# Patient Record
Sex: Female | Born: 1945 | Race: White | Hispanic: No | Smoking: Current every day smoker
Health system: Southern US, Community
[De-identification: ages and names within clinical notes are randomized; demographics above are authoritative.]

## PROBLEM LIST (undated history)

## (undated) DIAGNOSIS — J449 Chronic obstructive pulmonary disease, unspecified: Secondary | ICD-10-CM

## (undated) DIAGNOSIS — F17200 Nicotine dependence, unspecified, uncomplicated: Secondary | ICD-10-CM

## (undated) DIAGNOSIS — J310 Chronic rhinitis: Secondary | ICD-10-CM

## (undated) DIAGNOSIS — E039 Hypothyroidism, unspecified: Secondary | ICD-10-CM

## (undated) DIAGNOSIS — J209 Acute bronchitis, unspecified: Secondary | ICD-10-CM

## (undated) DIAGNOSIS — D649 Anemia, unspecified: Secondary | ICD-10-CM

## (undated) DIAGNOSIS — M1990 Unspecified osteoarthritis, unspecified site: Secondary | ICD-10-CM

## (undated) DIAGNOSIS — E079 Disorder of thyroid, unspecified: Secondary | ICD-10-CM

## (undated) DIAGNOSIS — E0590 Thyrotoxicosis, unspecified without thyrotoxic crisis or storm: Secondary | ICD-10-CM

## (undated) DIAGNOSIS — R51 Headache: Secondary | ICD-10-CM

## (undated) DIAGNOSIS — K219 Gastro-esophageal reflux disease without esophagitis: Secondary | ICD-10-CM

## (undated) DIAGNOSIS — R519 Headache, unspecified: Secondary | ICD-10-CM

## (undated) HISTORY — DX: Nicotine dependence, unspecified, uncomplicated: F17.200

## (undated) HISTORY — PX: GASTRIC OUTLET OBSTRUCTION RELEASE: SHX5247

## (undated) HISTORY — DX: Acute bronchitis, unspecified: J20.9

## (undated) HISTORY — DX: Chronic rhinitis: J31.0

## (undated) HISTORY — PX: TOTAL ABDOMINAL HYSTERECTOMY: SHX209

---

## 1999-10-03 ENCOUNTER — Encounter: Payer: Self-pay | Admitting: Obstetrics and Gynecology

## 1999-10-03 ENCOUNTER — Ambulatory Visit (HOSPITAL_COMMUNITY): Admission: RE | Admit: 1999-10-03 | Discharge: 1999-10-03 | Payer: Self-pay | Admitting: Obstetrics and Gynecology

## 2000-05-03 ENCOUNTER — Emergency Department (HOSPITAL_COMMUNITY): Admission: EM | Admit: 2000-05-03 | Discharge: 2000-05-03 | Payer: Self-pay

## 2001-03-22 ENCOUNTER — Encounter: Admission: RE | Admit: 2001-03-22 | Discharge: 2001-03-22 | Payer: Self-pay | Admitting: Surgery

## 2001-03-22 ENCOUNTER — Encounter: Payer: Self-pay | Admitting: Surgery

## 2003-12-21 ENCOUNTER — Encounter: Admission: RE | Admit: 2003-12-21 | Discharge: 2003-12-21 | Payer: Self-pay | Admitting: Internal Medicine

## 2004-01-14 ENCOUNTER — Ambulatory Visit (HOSPITAL_COMMUNITY): Admission: RE | Admit: 2004-01-14 | Discharge: 2004-01-14 | Payer: Self-pay | Admitting: Gastroenterology

## 2004-01-21 ENCOUNTER — Inpatient Hospital Stay (HOSPITAL_COMMUNITY): Admission: RE | Admit: 2004-01-21 | Discharge: 2004-01-26 | Payer: Self-pay | Admitting: *Deleted

## 2004-01-21 ENCOUNTER — Encounter (INDEPENDENT_AMBULATORY_CARE_PROVIDER_SITE_OTHER): Payer: Self-pay | Admitting: *Deleted

## 2004-02-07 ENCOUNTER — Encounter: Admission: RE | Admit: 2004-02-07 | Discharge: 2004-02-07 | Payer: Self-pay | Admitting: Surgery

## 2004-03-28 ENCOUNTER — Ambulatory Visit: Payer: Self-pay | Admitting: Internal Medicine

## 2004-04-14 ENCOUNTER — Ambulatory Visit: Payer: Self-pay | Admitting: Internal Medicine

## 2004-07-09 ENCOUNTER — Other Ambulatory Visit: Admission: RE | Admit: 2004-07-09 | Discharge: 2004-07-09 | Payer: Self-pay | Admitting: Internal Medicine

## 2004-07-10 ENCOUNTER — Encounter: Admission: RE | Admit: 2004-07-10 | Discharge: 2004-07-10 | Payer: Self-pay | Admitting: Internal Medicine

## 2004-07-23 ENCOUNTER — Encounter: Admission: RE | Admit: 2004-07-23 | Discharge: 2004-07-23 | Payer: Self-pay | Admitting: Internal Medicine

## 2004-09-19 ENCOUNTER — Ambulatory Visit: Payer: Self-pay | Admitting: Internal Medicine

## 2004-12-01 ENCOUNTER — Ambulatory Visit: Payer: Self-pay | Admitting: Internal Medicine

## 2005-03-10 ENCOUNTER — Ambulatory Visit: Payer: Self-pay | Admitting: Internal Medicine

## 2005-03-26 ENCOUNTER — Encounter: Admission: RE | Admit: 2005-03-26 | Discharge: 2005-03-26 | Payer: Self-pay | Admitting: Internal Medicine

## 2005-04-03 ENCOUNTER — Ambulatory Visit: Payer: Self-pay | Admitting: Internal Medicine

## 2005-04-09 ENCOUNTER — Ambulatory Visit: Payer: Self-pay | Admitting: Internal Medicine

## 2005-09-18 ENCOUNTER — Encounter: Admission: RE | Admit: 2005-09-18 | Discharge: 2005-09-18 | Payer: Self-pay | Admitting: Gastroenterology

## 2005-11-02 ENCOUNTER — Encounter: Admission: RE | Admit: 2005-11-02 | Discharge: 2005-11-02 | Payer: Self-pay | Admitting: Internal Medicine

## 2005-12-25 ENCOUNTER — Ambulatory Visit: Payer: Self-pay | Admitting: Internal Medicine

## 2006-01-22 ENCOUNTER — Ambulatory Visit: Payer: Self-pay | Admitting: Pulmonary Disease

## 2006-02-05 ENCOUNTER — Ambulatory Visit: Payer: Self-pay | Admitting: Internal Medicine

## 2006-11-03 ENCOUNTER — Ambulatory Visit: Payer: Self-pay | Admitting: Internal Medicine

## 2006-11-19 DIAGNOSIS — J209 Acute bronchitis, unspecified: Secondary | ICD-10-CM | POA: Insufficient documentation

## 2006-11-19 DIAGNOSIS — F17200 Nicotine dependence, unspecified, uncomplicated: Secondary | ICD-10-CM | POA: Insufficient documentation

## 2006-11-19 DIAGNOSIS — J310 Chronic rhinitis: Secondary | ICD-10-CM | POA: Insufficient documentation

## 2007-02-17 ENCOUNTER — Ambulatory Visit: Payer: Self-pay | Admitting: Pulmonary Disease

## 2007-02-17 DIAGNOSIS — J019 Acute sinusitis, unspecified: Secondary | ICD-10-CM | POA: Insufficient documentation

## 2007-03-08 ENCOUNTER — Encounter: Payer: Self-pay | Admitting: Internal Medicine

## 2008-03-01 ENCOUNTER — Encounter: Admission: RE | Admit: 2008-03-01 | Discharge: 2008-03-01 | Payer: Self-pay | Admitting: Gastroenterology

## 2008-05-18 ENCOUNTER — Ambulatory Visit: Payer: Self-pay | Admitting: Internal Medicine

## 2008-05-29 ENCOUNTER — Ambulatory Visit: Payer: Self-pay | Admitting: Internal Medicine

## 2008-06-29 ENCOUNTER — Ambulatory Visit: Payer: Self-pay | Admitting: Internal Medicine

## 2008-08-06 ENCOUNTER — Encounter: Admission: RE | Admit: 2008-08-06 | Discharge: 2008-08-06 | Payer: Self-pay | Admitting: Internal Medicine

## 2008-11-07 ENCOUNTER — Ambulatory Visit: Payer: Self-pay | Admitting: Internal Medicine

## 2008-12-03 ENCOUNTER — Ambulatory Visit: Payer: Self-pay | Admitting: Internal Medicine

## 2009-02-07 ENCOUNTER — Encounter: Admission: RE | Admit: 2009-02-07 | Discharge: 2009-02-07 | Payer: Self-pay | Admitting: Internal Medicine

## 2009-03-15 ENCOUNTER — Ambulatory Visit: Payer: Self-pay | Admitting: Internal Medicine

## 2009-05-13 ENCOUNTER — Ambulatory Visit: Payer: Self-pay | Admitting: Internal Medicine

## 2009-09-17 ENCOUNTER — Ambulatory Visit: Payer: Self-pay | Admitting: Internal Medicine

## 2009-11-26 ENCOUNTER — Encounter: Admission: RE | Admit: 2009-11-26 | Discharge: 2009-11-26 | Payer: Self-pay | Admitting: Internal Medicine

## 2009-12-03 ENCOUNTER — Telehealth (INDEPENDENT_AMBULATORY_CARE_PROVIDER_SITE_OTHER): Payer: Self-pay | Admitting: *Deleted

## 2010-01-28 ENCOUNTER — Encounter
Admission: RE | Admit: 2010-01-28 | Discharge: 2010-01-28 | Payer: Self-pay | Source: Home / Self Care | Attending: Internal Medicine | Admitting: Internal Medicine

## 2010-02-09 DIAGNOSIS — E0590 Thyrotoxicosis, unspecified without thyrotoxic crisis or storm: Secondary | ICD-10-CM

## 2010-02-09 HISTORY — PX: THYROIDECTOMY: SHX17

## 2010-02-09 HISTORY — DX: Thyrotoxicosis, unspecified without thyrotoxic crisis or storm: E05.90

## 2010-02-25 ENCOUNTER — Encounter
Admission: RE | Admit: 2010-02-25 | Discharge: 2010-02-25 | Payer: Self-pay | Source: Home / Self Care | Attending: General Surgery | Admitting: General Surgery

## 2010-02-25 ENCOUNTER — Other Ambulatory Visit
Admission: RE | Admit: 2010-02-25 | Discharge: 2010-02-25 | Payer: Self-pay | Source: Home / Self Care | Admitting: Interventional Radiology

## 2010-02-25 ENCOUNTER — Other Ambulatory Visit: Payer: Self-pay | Admitting: Interventional Radiology

## 2010-03-13 NOTE — Assessment & Plan Note (Signed)
Summary: lots of head congestion/up all night/apc   Primary Provider/Referring Provider:  Burney Gauze  CC:  Acute visit.  Pt c/o prod cough x 5 days- sputum is clear. Cough worse at night when lies down.  She states that cough caused her to vomit last night.  Also c/o runny nose and occ sore throat.Marland Kitchen  History of Present Illness: 11/07/08- Allergic rhinitis, recurrent bronchitis Got through summer without significant problems. Occasional runny nose, but no significant sneeze, draiinage, congestion and no cough or wheeze.Not needing Flonase. Walks regularly. Still smoking. Gets annual CXR through Dr Donette Larry for work.  , December 03, 2008--Presents for an acute office visit. Complains of sore throat, cough, congestion, drainage, malaise. Doing well with no flare since 4/10. Cough is getting worse , aggravating her now. Sore throat is worse in morning, getting better. Denies chest pain, orthopnea, hemoptysis, fever, n/v/d, edema, headache,recent travel or antibitoics.   March 15, 2009- Allergic rhinits, recurrent bronchitis 4-5 days acutely ill with headache, sore throat, ears full, cough is productive white, retching from postnasal drip.  May 13, 2009- Allergic rhinitis, recurent bronchitis Pollen is heavy now and she feels worse ofter being outside to walk her dog. Taking Allegra and Sudafed For 2 weeks ears popping, maxillary pressure, runny nose, postnasal drip that makes her queasy. She asks for repeat of what helped last time. Tramadol helped cough.  September 17, 2009- Allergic rhinits, recurrent bronchitis, tobacco Acute visit. 4 days gradually worse cough, productive thick mucus. Aches all over. No definite fever, no chills. Some throat irritation, hatband headache. Chest ok. Contiues to smoke. Last time for similar illness we gave neb, depo, trramadol, mucinex, zpak, sudafed. She wants to do the same this time, because she feels that's what it took.  Preventive Screening-Counseling &  Management  Alcohol-Tobacco     Smoking Status: current     Smoking Cessation Counseling: yes     Smoke Cessation Stage: precontemplative     Packs/Day: 1.0     Tobacco Counseling: to quit use of tobacco products  Current Medications (verified): 1)  Centrum   Tabs (Multiple Vitamins-Minerals) .... Take 1 Tablet By Mouth Once A Day 2)  Nexium 40 Mg  Pack (Esomeprazole Magnesium) .... Take 1 Capsule By Mouth Once A Day 3)  Sudafed 30 Mg Tabs (Pseudoephedrine Hcl) .... Per Bottle 4)  Allegra 180 Mg Tabs (Fexofenadine Hcl) .... Take 1 Tablet By Mouth Once A Day As Needed  Allergies (verified): 1)  ! Penicillin 2)  ! Levaquin  Past History:  Past Medical History: Last updated: 02/17/2007  TOBACCO ABUSE (ICD-305.1) ASTHMATIC BRONCHITIS, ACUTE (ICD-466.0) RHINITIS, CHRONIC (ICD-472.0)  Past Surgical History: Last updated: 07/10/08 Gastric outlet obstruction surgery due to gastric ulcers Total Abdominal Hysterectomy  Family History: Last updated: 07/10/08 Father- died cancer Mother- died cirrhosis/ ETOH  Social History: Last updated: Jul 10, 2008 Lives alone.  has black lab lives in home  Patient is a current smoker.- 1 ppd  Risk Factors: Smoking Status: current (09/17/2009) Packs/Day: 1.0 (09/17/2009)  Review of Systems      See HPI       The patient complains of non-productive cough, sore throat, headaches, and nasal congestion/difficulty breathing through nose.  The patient denies shortness of breath with activity, shortness of breath at rest, productive cough, coughing up blood, chest pain, irregular heartbeats, acid heartburn, indigestion, loss of appetite, weight change, abdominal pain, difficulty swallowing, and tooth/dental problems.    Vital Signs:  Patient profile:   64 year  old female Weight:      85 pounds O2 Sat:      100 % on Room air Temp:     99.1 degrees F oral Pulse rate:   93 / minute BP sitting:   108 / 64  (left arm) Cuff size:    small  Vitals Entered By: Vernie Murders (September 17, 2009 2:08 PM)  O2 Flow:  Room air  Physical Exam  Additional Exam:  General: A/Ox3; pleasant and cooperative, NAD, slender SKIN: no rash, lesions, very pale complection NODES: no lymphadenopathy HEENT: [place]/AT, EOM- WNL, Conjuctivae- clear,  TM-WNL, Nose- sniffing, mucosa red, small crust right nare, Throat- coated tongue from cough  drop- bright red no PND, Mallampati II NECK: Supple w/ fair ROM, JVD- none, normal carotid impulses w/o bruits- prominent left carotid at neck Thyroid- normal to palpation CHEST: Coarse BS w/ few rhonchi, no wheezing.  HEART: RRR, no m/g/r heard ABDOMEN: thin ZOX:WRUE, nl pulses, no edema  NEURO: Grossly intact to observation      Impression & Recommendations:  Problem # 1:  SINUSITIS, ACUTE (ICD-461.9)  Acute rhinosinusitis and early laryngitis. She acts as if this may be viral, but without recognized sick exposure. Diuscussed air quality and encouraged smoking cessation. She insists that she wants "everything we gave last time". Z pak, neb/ depo, tramadol. Encourage fluids. Z pak, Sudafed, Mucinex, neb neo, depo 80. The following medications were removed from the medication list:    Zithromax Z-pak 250 Mg Tabs (Azithromycin) .Marland Kitchen... 2 today then one daily Her updated medication list for this problem includes:    Sudafed 30 Mg Tabs (Pseudoephedrine hcl) .Marland Kitchen... Per bottle    Zithromax Z-pak 250 Mg Tabs (Azithromycin) .Marland Kitchen... 2 today then one daily  Problem # 2:  TOBACCO ABUSE (ICD-305.1) I pointed again to her smokng as a substantial basis for her recurrent airway irritation and asked he to try to quit.  Medications Added to Medication List This Visit: 1)  Tramadol Hcl 50 Mg Tabs (Tramadol hcl) .Marland Kitchen.. 1 three times a day as needed cough 2)  Zithromax Z-pak 250 Mg Tabs (Azithromycin) .... 2 today then one daily  Other Orders: Est. Patient Level IV (45409)  Patient Instructions: 1)  Please schedule a  follow-up appointment in 6 months. 2)  Neb neo nasal 3)  depo 80 4)  Fluids 5)  Scripts for tramadol for cough and for Z pak antibiotic. 6)  Ok to use Sudafed, Careers adviser or claritin, mucinex. Prescriptions: ZITHROMAX Z-PAK 250 MG TABS (AZITHROMYCIN) 2 today then one daily  #1 pak x 1   Entered and Authorized by:   Waymon Budge MD   Signed by:   Waymon Budge MD on 09/17/2009   Method used:   Print then Give to Patient   RxID:   [id number] TRAMADOL HCL 50 MG TABS (TRAMADOL HCL) 1 three times a day as needed cough  #30 x 1   Entered and Authorized by:   Waymon Budge MD   Signed by:   Waymon Budge MD on 09/17/2009   Method used:   Print then Give to Patient   RxID:   [id number]   Appended Document: lots of head congestion/up all night/apc     Clinical Lists Changes  Orders: Added new Service order of Nebulizer Tx (32440) - Signed Added new Service order of Depo- Medrol 80mg  (J1040) - Signed Added new Service order of Admin of Therapeutic Inj  intramuscular or subcutaneous (10272) - Signed  Medication Administration  Injection # 1:    Medication: Depo- Medrol 80mg     Diagnosis: SINUSITIS, ACUTE (ICD-461.9)    Route: SQ    Site: LUOQ gluteus    Exp Date: 05/2012    Lot #: [id number]    Mfr: Pharmacia    Patient tolerated injection without complications    Given by: Vivianne Spence  Medication # 1:    Medication: EMR miscellaneous medications    Diagnosis: SINUSITIS, ACUTE (ICD-461.9)    Dose: 3 drops    Route: intranasal    Exp Date: 11/2010    Lot #: [id number]    Mfr: bayer    Comments: Neo-Synephrine    Patient tolerated medication without complications    Given by: Kathrin Greathouse  Orders Added: 1)  Nebulizer Tx [11914] 2)  Depo- Medrol 80mg  [J1040] 3)  Admin of Therapeutic Inj  intramuscular or subcutaneous [78295]

## 2010-03-13 NOTE — Assessment & Plan Note (Signed)
Summary: running nose/ cough/ mbw   Primary Provider/Referring Provider:  Burney Gauze  CC:  runny nose, sinus pressure/congesiton with clear drainage, PND causing prod cough, and eyes watering x2weeks - states zpak and depo helped but symptoms returned.  History of Present Illness: 11/07/08- Allergic rhinitis, recurrent bronchitis Got through summer without significant problems. Occasional runny nose, but no significant sneeze, draiinage, congestion and no cough or wheeze.Not needing Flonase. Walks regularly. Still smoking. Gets annual CXR through Dr Donette Larry for work.  , December 03, 2008--Presents for an acute office visit. Complains of sore throat, cough, congestion, drainage, malaise. Doing well with no flare since 4/10. Cough is getting worse , aggravating her now. Sore throat is worse in morning, getting better. Denies chest pain, orthopnea, hemoptysis, fever, n/v/d, edema, headache,recent travel or antibitoics.   March 15, 2009- Allergic rhinits, recurrent bronchitis 4-5 days acutely ill with headache, sore throat, ears full, cough is productive white, retching from postnasal drip.  May 13, 2009- Allergic rhinitis, recurent bronchitis Pollen is heavy now and she feels worse ofter being outside to walk her dog. Taking Allegra and Sudafed For 2 weeks ears popping, maxillary pressure, runny nose, postnasal drip that makes her queasy. She asks for repeat of what helped last time. Tramadol helped cough.   Current Medications (verified): 1)  Centrum   Tabs (Multiple Vitamins-Minerals) .... Take 1 Tablet By Mouth Once A Day 2)  Nexium 40 Mg  Pack (Esomeprazole Magnesium) .... Take 1 Capsule By Mouth Once A Day 3)  Sudafed 30 Mg Tabs (Pseudoephedrine Hcl) .... Per Bottle 4)  Allegra 180 Mg Tabs (Fexofenadine Hcl) .... Take 1 Tablet By Mouth Once A Day As Needed  Allergies (verified): 1)  ! Penicillin 2)  ! Levaquin  Past History:  Past Medical History: Last updated:  02/17/2007  TOBACCO ABUSE (ICD-305.1) ASTHMATIC BRONCHITIS, ACUTE (ICD-466.0) RHINITIS, CHRONIC (ICD-472.0)  Past Surgical History: Last updated: 07-23-08 Gastric outlet obstruction surgery due to gastric ulcers Total Abdominal Hysterectomy  Family History: Last updated: Jul 23, 2008 Father- died cancer Mother- died cirrhosis/ ETOH  Social History: Last updated: 07/23/2008 Lives alone.  has black lab lives in home  Patient is a current smoker.- 1 ppd  Risk Factors: Smoking Status: current (2008-07-23) Packs/Day: 1.0 (05/18/2008)  Review of Systems      See HPI  The patient denies anorexia, fever, weight loss, weight gain, vision loss, decreased hearing, hoarseness, chest pain, syncope, dyspnea on exertion, peripheral edema, prolonged cough, headaches, hemoptysis, and severe indigestion/heartburn.    Vital Signs:  Patient profile:   65 year old female Height:      62 inches Weight:      91 pounds BMI:     16.70 O2 Sat:      98 % on Room air Pulse rate:   79 / minute BP sitting:   130 / 74  (left arm) Cuff size:   regular  Vitals Entered By: Boone Master CNA (May 13, 2009 4:07 PM)  O2 Flow:  Room air CC: runny nose, sinus pressure/congesiton with clear drainage, PND causing prod cough, eyes watering x2weeks - states zpak and depo helped but symptoms returned Comments Medications reviewed with patient Daytime contact number verified with patient. Boone Master CNA  May 13, 2009 4:08 PM    Physical Exam  Additional Exam:  General: A/Ox3; pleasant and cooperative, NAD, slender SKIN: no rash, lesions, very pale complection NODES: no lymphadenopathy HEENT: [place]/AT, EOM- WNL, Conjuctivae- clear,  TM-WNL, Nose- sniffing, mucosa red,  small crust right nare, Throat- coated tongue from cough  drop, no PND, Melampatti II NECK: Supple w/ fair ROM, JVD- none, normal carotid impulses w/o bruits- prominent left carotid at neck Thyroid- normal to palpation CHEST: Coarse BS  w/ few rhonchi, no wheezing.  HEART: RRR, no m/g/r heard ABDOMEN: ZOX:WRUE, nl pulses, no edema  NEURO: Grossly intact to observation      Impression & Recommendations:  Problem # 1:  RHINITIS, CHRONIC (ICD-472.0)  Exacerbation of her rhinitis. There may be some mild infection/ sinusitis.  Orders: Est. Patient Level III (45409)  Problem # 2:  TOBACCO ABUSE (ICD-305.1)  Encouraged to use available resources to quit.  Orders: Est. Patient Level III (81191) Tobacco use cessation intermediate 3-10 minutes (47829)  Medications Added to Medication List This Visit: 1)  Sudafed 30 Mg Tabs (Pseudoephedrine hcl) .... Per bottle 2)  Allegra 180 Mg Tabs (Fexofenadine hcl) .... Take 1 tablet by mouth once a day as needed 3)  Zithromax Z-pak 250 Mg Tabs (Azithromycin) .... 2 today then one daily 4)  Tramadol Hcl 50 Mg Tabs (Tramadol hcl) .Marland Kitchen.. 1 three times a day as needed cough  Other Orders: Admin of Therapeutic Inj  intramuscular or subcutaneous (56213) Depo- Medrol 80mg  (J1040) Nebulizer Tx (08657)  Patient Instructions: 1)  Please schedule a follow-up appointment in 6 months. 2)  neb neo nasal 3)  depo 80 4)  Try neosporin on a Q-tip for the place in your nose 5)  Please keep trying to get off those cigarettes! 6)  Scripts for Z pak and for tramadol Prescriptions: TRAMADOL HCL 50 MG TABS (TRAMADOL HCL) 1 three times a day as needed cough  #30 x 0   Entered and Authorized by:   Waymon Budge MD   Signed by:   Waymon Budge MD on 05/13/2009   Method used:   Print then Give to Patient   RxID:   [id number] ZITHROMAX Z-PAK 250 MG TABS (AZITHROMYCIN) 2 today then one daily  #1 Pak x 0   Entered and Authorized by:   Waymon Budge MD   Signed by:   Waymon Budge MD on 05/13/2009   Method used:   Print then Give to Patient   RxID:   [id number]    Medication Administration  Injection # 1:    Medication: Depo- Medrol 80mg     Diagnosis: SINUSITIS, ACUTE  (ICD-461.9)    Route: SQ    Site: RUOQ gluteus    Exp Date: 12/2011    Lot #: [id number]    Mfr: Pharmacia    Patient tolerated injection without complications    Given by: Reynaldo Minium [name] (May 13, 2009 4:44 PM)  Medication # 1:    Medication: EMR miscellaneous medications    Diagnosis: SINUSITIS, ACUTE (ICD-461.9)    Dose: 3 drops    Route: intranasal    Exp Date: 06/2010    Lot #: [id number]    Mfr: Bayer    Comments: Neo-synephrine.    Patient tolerated medication without complications    Given by: Reynaldo Minium [name] (May 13, 2009 4:44 PM)  Orders Added: 1)  Est. Patient Level III [87564] 2)  Tobacco use cessation intermediate 3-10 minutes [99406] 3)  Admin of Therapeutic Inj  intramuscular or subcutaneous [96372] 4)  Depo- Medrol 80mg  [J1040] 5)  Nebulizer Tx [33295]

## 2010-03-13 NOTE — Assessment & Plan Note (Signed)
Summary: congested/ cough/ mbw   Primary Provider/Referring Provider:  Burney Gauze  CC:  Accute visit-bringing up clear phelgm; nasal drainage and sore throat since Thursday last week; currently taking Mucinex and Sudafed-no relief. Headache and Left ear pain.Marland Kitchen  History of Present Illness:   11/07/08- Allergic rhinitis, recurrent bronchitis Got through summer without significant problems. Occasional runny nose, but no significant sneeze, draiinage, congestion and no cough or wheeze.Not needing Flonase. Walks regularly. Still smoking. Gets annual CXR through Dr Donette Larry for work.  , December 03, 2008--Presents for an acute office visit. Complains of sore throat, cough, congestion, drainage, malaise. Doing well with no flare since 4/10. Cough is getting worse , aggravating her now. Sore throat is worse in morning, getting better. Denies chest pain, orthopnea, hemoptysis, fever, n/v/d, edema, headache,recent travel or antibitoics.   March 15, 2009- Allergic rhinits, recurrent bronchitis 4-5 days acutely ill with headache, sore throat, ears full, cough is productive white, retching from postnasal drip.    Current Medications (verified): 1)  Centrum   Tabs (Multiple Vitamins-Minerals) .... Take 1 Tablet By Mouth Once A Day 2)  Nexium 40 Mg  Pack (Esomeprazole Magnesium) .... Take 1 Capsule By Mouth Once A Day  Allergies (verified): 1)  ! Penicillin 2)  ! Levaquin  Past History:  Past Medical History: Last updated: 02/17/2007  TOBACCO ABUSE (ICD-305.1) ASTHMATIC BRONCHITIS, ACUTE (ICD-466.0) RHINITIS, CHRONIC (ICD-472.0)  Past Surgical History: Last updated: 07/12/2008 Gastric outlet obstruction surgery due to gastric ulcers Total Abdominal Hysterectomy  Family History: Last updated: 07-12-08 Father- died cancer Mother- died cirrhosis/ ETOH  Social History: Last updated: 07/12/08 Lives alone.  has black lab lives in home  Patient is a current smoker.- 1 ppd  Risk  Factors: Smoking Status: current (July 12, 2008) Packs/Day: 1.0 (05/18/2008)  Review of Systems      See HPI       The patient complains of fever, hoarseness, and prolonged cough.  The patient denies anorexia, weight loss, weight gain, vision loss, decreased hearing, chest pain, syncope, dyspnea on exertion, peripheral edema, headaches, hemoptysis, abdominal pain, and severe indigestion/heartburn.    Vital Signs:  Patient profile:   65 year old female Height:      62 inches Weight:      96.38 pounds BMI:     17.69 O2 Sat:      99 % on Room air Pulse rate:   75 / minute BP sitting:   110 / 68  (left arm) Cuff size:   regular  Vitals Entered By: Reynaldo Minium [name] (March 15, 2009 3:18 PM)  O2 Flow:  Room air  Physical Exam  Additional Exam:  General: A/Ox3; pleasant and cooperative, NAD, slender SKIN: no rash, lesions, very pale complection NODES: no lymphadenopathy HEENT: [place]/AT, EOM- WNL, Conjuctivae- clear,  TM-WNL, Nose- sniffing, mucosa red, Throat- coated tongue from cough  drop, no PND, Melampatti II NECK: Supple w/ fair ROM, JVD- none, normal carotid impulses w/o bruits- prominent left carotid at neck Thyroid- normal to palpation CHEST: Coarse BS w/ few rhonchi, no wheezing.  HEART: RRR, no m/g/r heard ABDOMEN: EAV:WUJW, nl pulses, no edema  NEURO: Grossly intact to observation      Impression & Recommendations:  Problem # 1:  ASTHMATIC BRONCHITIS, ACUTE (ICD-466.0)  Acute URI  and bronchitis most likely viral initially. Supportive care emphasized.  The following medications were removed from the medication list:    Promethazine-codeine 6.25-10 Mg/41ml Syrp (Promethazine-codeine) .Marland Kitchen... 1 tsp every 4 hr as needed cough  Doxycycline Hyclate 100 Mg Caps (Doxycycline hyclate) .Marland Kitchen... 1 by mouth two times a day Her updated medication list for this problem includes:    Zithromax Z-pak 250 Mg Tabs (Azithromycin) .Marland Kitchen... 2 today then one daily  Medications Added to  Medication List This Visit: 1)  Zithromax Z-pak 250 Mg Tabs (Azithromycin) .... 2 today then one daily 2)  Tramadol Hcl 50 Mg Tabs (Tramadol hcl) .Marland Kitchen.. 1 three times a day as needed for pain and cough  Other Orders: Est. Patient Level III (16109) Admin of Therapeutic Inj  intramuscular or subcutaneous (60454) Depo- Medrol 80mg  (J1040) Nebulizer Tx (09811)  Patient Instructions: 1)  Keep September appointment 2)  neb neo nasal 3)  depo 80 4)  Scripts for tramadol for cough and headache, also for Z pak Prescriptions: TRAMADOL HCL 50 MG TABS (TRAMADOL HCL) 1 three times a day as needed for pain and cough  #25 x 1   Entered and Authorized by:   Waymon Budge MD   Signed by:   Waymon Budge MD on 03/15/2009   Method used:   Print then Give to Patient   RxID:   [id number] ZITHROMAX Z-PAK 250 MG TABS (AZITHROMYCIN) 2 today then one daily  #1 pak x 0   Entered and Authorized by:   Waymon Budge MD   Signed by:   Waymon Budge MD on 03/15/2009   Method used:   Print then Give to Patient   RxID:   [id number]      Medication Administration  Injection # 1:    Medication: Depo- Medrol 80mg     Diagnosis: ASTHMATIC BRONCHITIS, ACUTE (ICD-466.0)    Route: SQ    Site: RUOQ gluteus    Exp Date: 11/2009    Lot #: [id number]    Mfr: Teva    Patient tolerated injection without complications    Given by: Reynaldo Minium [name] (March 15, 2009 4:06 PM)  Medication # 1:    Medication: EMR miscellaneous medications    Diagnosis: ASTHMATIC BRONCHITIS, ACUTE (ICD-466.0)    Dose: 3 drops    Route: intranasal    Exp Date: 06/2010    Lot #: [id number]    Mfr: Bayer    Comments: Neo-Synephrine    Patient tolerated medication without complications    Given by: Reynaldo Minium [name] (March 15, 2009 4:06 PM)  Orders Added: 1)  Est. Patient Level III [47425] 2)  Admin of Therapeutic Inj  intramuscular or subcutaneous [96372] 3)  Depo- Medrol 80mg  [J1040] 4)  Nebulizer Tx  [95638]

## 2010-03-13 NOTE — Progress Notes (Signed)
Summary: headache/ congestion  Phone Note Call from Patient   Caller: Patient Call For: young Summary of Call: headache congestion runny nose chills [facility] Initial call taken by: Rickard Patience,  December 03, 2009 11:01 AM  Follow-up for Phone Call        Spoke with pt.  She c/o "spitting up mucus" x several days, also had sore throat and chills off and on.  She states that she has also had some HA and facial pressure. Pls advise thanks allergic to PCN and levaquin Follow-up by: Vernie Murders,  December 03, 2009 11:10 AM  Additional Follow-up for Phone Call Additional follow up Details #1::        Per Dr Maple Hudson, call in zpack     Additional Follow-up for Phone Call Additional follow up Details #2::    Per Dr Maple Hudson call in zpack.  Spoke with pt and notified this was done.  Follow-up by: Vernie Murders,  December 03, 2009 2:01 PM  New/Updated Medications: ZITHROMAX Z-PAK 250 MG TABS (AZITHROMYCIN) take as directed Prescriptions: ZITHROMAX Z-PAK 250 MG TABS (AZITHROMYCIN) take as directed  #1 x 0   Entered by:   Vernie Murders   Authorized by:   Waymon Budge MD   Signed by:   Vernie Murders on 12/03/2009   Method used:   Electronically to        [facility]  [phone]* (retail)       [address] [place], [place]  [address]       Ph: [phone] or [phone]       Fax: [phone]   RxID:   [id number]

## 2010-03-17 ENCOUNTER — Encounter: Payer: Self-pay | Admitting: Internal Medicine

## 2010-03-17 ENCOUNTER — Ambulatory Visit (INDEPENDENT_AMBULATORY_CARE_PROVIDER_SITE_OTHER): Payer: Medicare Other | Admitting: Internal Medicine

## 2010-03-17 DIAGNOSIS — J019 Acute sinusitis, unspecified: Secondary | ICD-10-CM

## 2010-03-17 DIAGNOSIS — J310 Chronic rhinitis: Secondary | ICD-10-CM

## 2010-03-17 DIAGNOSIS — F17200 Nicotine dependence, unspecified, uncomplicated: Secondary | ICD-10-CM

## 2010-03-27 NOTE — Assessment & Plan Note (Signed)
Summary: 6 MONTHS   Primary Provider/Referring Provider:  Burney Gauze  CC:  6 month follow up visit-runny nose, stopped up nasal passages at times(one side), and cough-productive no color; chills on Friday and Saturday. Nose Bleed yesterday.Marland Kitchen  History of Present Illness:  March 15, 2009- Allergic rhinits, recurrent bronchitis 4-5 days acutely ill with headache, sore throat, ears full, cough is productive white, retching from postnasal drip.  May 13, 2009- Allergic rhinitis, recurent bronchitis Pollen is heavy now and she feels worse ofter being outside to walk her dog. Taking Allegra and Sudafed For 2 weeks ears popping, maxillary pressure, runny nose, postnasal drip that makes her queasy. She asks for repeat of what helped last time. Tramadol helped cough.  September 17, 2009- Allergic rhinits, recurrent bronchitis, tobacco Acute visit. 4 days gradually worse cough, productive thick mucus. Aches all over. No definite fever, no chills. Some throat irritation, hatband headache. Chest ok. Contiues to smoke. Last time for similar illness we gave neb, depo, trramadol, mucinex, zpak, sudafed. She wants to do the same this time, because she feels that's what it took.  March 17, 2010- Allergic rhinits, recurrent bronchitis, tobacco Nurse-CC: 6 month follow up visit-runny nose,stopped up nasal passages at times(one side), cough-productive no color; chills on Friday and Saturday. Nose Bleed yesterday. Acute illness as above, adding that cough wakes her repeatedly. Had been well through the Winter since last here. Had recent dental extractions. Hx thyroid goiter XRT. 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     Preventive Screening-Counseling & Management  Alcohol-Tobacco     Smoking Status: current     Smoking Cessation Counseling: yes     Smoke Cessation Stage: precontemplative     Packs/Day: 1.0     Year Started: 1965     Tobacco Counseling: to quit use of tobacco  products  Current Medications (verified): 1)  Centrum   Tabs (Multiple Vitamins-Minerals) .... Take 1 Tablet By Mouth Once A Day 2)  Nexium 40 Mg  Pack (Esomeprazole Magnesium) .... Take 1 Capsule By Mouth Once A Day 3)  Sudafed 30 Mg Tabs (Pseudoephedrine Hcl) .... Per Bottle 4)  Allegra 180 Mg Tabs (Fexofenadine Hcl) .... Take 1 Tablet By Mouth Once A Day As Needed  Allergies (verified): 1)  ! Penicillin 2)  ! Levaquin  Past History:  Past Surgical History: Last updated: 2008/07/21 Gastric outlet obstruction surgery due to gastric ulcers Total Abdominal Hysterectomy  Family History: Last updated: July 21, 2008 Father- died cancer Mother- died cirrhosis/ ETOH  Social History: Last updated: 2008/07/21 Lives alone.  has black lab lives in home  Patient is a current smoker.- 1 ppd  Risk Factors: Smoking Status: current (03/17/2010) Packs/Day: 1.0 (03/17/2010)  Past Medical History:  TOBACCO ABUSE (ICD-305.1) ASTHMATIC BRONCHITIS, ACUTE (ICD-466.0) RHINITIS, CHRONIC (ICD-472.0) Hypothyroid/ hx goiter/ XRT  Review of Systems      See HPI       The patient complains of productive cough, sore throat, and nasal congestion/difficulty breathing through nose.  The patient denies shortness of breath with activity, shortness of breath at rest, coughing up blood, chest pain, irregular heartbeats, acid heartburn, indigestion, loss of appetite, weight change, abdominal pain, difficulty swallowing, headaches, ear ache, rash, and change in color of mucus.    Vital Signs:  Patient profile:   65 year old female Height:      62 inches Weight:      90.13 pounds BMI:     16.54 O2 Sat:  100 % on Room air Pulse rate:   84 / minute BP sitting:   140 / 88  (left arm) Cuff size:   regular  Vitals Entered By: Reynaldo Minium [name] (March 17, 2010 2:07 PM)  O2 Flow:  Room air CC: 6 month follow up visit-runny nose,stopped up nasal passages at times(one side), cough-productive no color;  chills on Friday and Saturday. Nose Bleed yesterday.   Physical Exam  Additional Exam:  General: A/Ox3; pleasant and cooperative, NAD, slender SKIN: no rash, lesions, very pale complection NODES: no lymphadenopathy HEENT: [place]/AT, EOM- WNL, Conjuctivae- clear,  TM-WNL, Nose- steady  sniffing, mucosa red,  Throat- coated tongue from cough  drop- bright red no PND, Mallampati II NECK: Supple w/ fair ROM, JVD- none, normal carotid impulses w/o bruits- prominent left carotid at neck, Thyroid- normal to palpation CHEST: Coarse BS w/ few rhonchi, no wheezing, no cough HEART: RRR, no m/g/r heard ABDOMEN: thin NFA:OZHY, nl pulses, no edema  NEURO: Grossly intact to observation      Impression & Recommendations:  Problem # 1:  SINUSITIS, ACUTE (ICD-461.9)  Acute rhinosinusitis. Initially this was viral pattern, with predisposition to overgrow bacterial infection. We will try to get her back on track before thyroid surgery in March. She says Tramadol, Zpak , neb anddepo last time worked well.  The following medications were removed from the medication list:    Zithromax Z-pak 250 Mg Tabs (Azithromycin) .Marland Kitchen... 2 today then one daily    Zithromax Z-pak 250 Mg Tabs (Azithromycin) .Marland Kitchen... Take as directed Her updated medication list for this problem includes:    Sudafed 30 Mg Tabs (Pseudoephedrine hcl) .Marland Kitchen... Per bottle    Zithromax Z-pak 250 Mg Tabs (Azithromycin) .Marland Kitchen... 2 today then one daily  Problem # 2:  TOBACCO ABUSE (ICD-305.1)  We continue supportive effort to get her to quit.   Medications Added to Medication List This Visit: 1)  Tramadol Hcl 50 Mg Tabs (Tramadol hcl) .Marland Kitchen.. 1 two times a day as needed 2)  Zithromax Z-pak 250 Mg Tabs (Azithromycin) .... 2 today then one daily  Other Orders: Est. Patient Level III (86578) Admin of Therapeutic Inj  intramuscular or subcutaneous (46962) Depo- Medrol 40mg  (J1030) Nebulizer Tx (95284)  Patient Instructions: 1)  Please schedule a follow-up  appointment in 6 months. 2)  neb neo nasal 3)  depo 40 4)  scripts for Zapk and tramadol Prescriptions: ZITHROMAX Z-PAK 250 MG TABS (AZITHROMYCIN) 2 today then one daily  #1 pak x 0   Entered and Authorized by:   Waymon Budge MD   Signed by:   Waymon Budge MD on 03/17/2010   Method used:   Print then Give to Patient   RxID:   [id number] TRAMADOL HCL 50 MG TABS (TRAMADOL HCL) 1 two times a day as needed  #25 x 0   Entered and Authorized by:   Waymon Budge MD   Signed by:   Waymon Budge MD on 03/17/2010   Method used:   Print then Give to Patient   RxID:   [id number]    Immunization History:  Influenza Immunization History:    Influenza:  historical (12/20/2009)    Medication Administration  Injection # 1:    Medication: Depo- Medrol 40mg     Diagnosis: SINUSITIS, ACUTE (ICD-461.9)    Route: SQ    Site: RUOQ gluteus    Exp Date: 08/2012    Lot #: [id number]    Mfr: Pharmacia  Patient tolerated injection without complications    Given by: Reynaldo Minium [name] (March 17, 2010 2:57 PM)  Medication # 1:    Medication: EMR miscellaneous medications    Diagnosis: SINUSITIS, ACUTE (ICD-461.9)    Dose: 3drops    Route: intranasal    Exp Date: 05/2011    Lot #: [id number]    Mfr: Bayer    Comments: Neo-Synephrine    Patient tolerated medication without complications    Given by: Reynaldo Minium [name] (March 17, 2010 2:58 PM)  Orders Added: 1)  Est. Patient Level III [45409] 2)  Admin of Therapeutic Inj  intramuscular or subcutaneous [96372] 3)  Depo- Medrol 40mg  [J1030] 4)  Nebulizer Tx [81191]

## 2010-04-08 ENCOUNTER — Other Ambulatory Visit: Payer: Self-pay | Admitting: General Surgery

## 2010-04-08 ENCOUNTER — Encounter (HOSPITAL_COMMUNITY): Payer: Medicare Other

## 2010-04-08 DIAGNOSIS — E042 Nontoxic multinodular goiter: Secondary | ICD-10-CM | POA: Insufficient documentation

## 2010-04-08 DIAGNOSIS — Z01810 Encounter for preprocedural cardiovascular examination: Secondary | ICD-10-CM | POA: Insufficient documentation

## 2010-04-08 DIAGNOSIS — Z01812 Encounter for preprocedural laboratory examination: Secondary | ICD-10-CM | POA: Insufficient documentation

## 2010-04-08 LAB — BASIC METABOLIC PANEL
BUN: 12 mg/dL (ref 6–23)
CO2: 29 mEq/L (ref 19–32)
Calcium: 8.7 mg/dL (ref 8.4–10.5)
Creatinine, Ser: 0.56 mg/dL (ref 0.4–1.2)
Glucose, Bld: 79 mg/dL (ref 70–99)
Potassium: 3.9 mEq/L (ref 3.5–5.1)
Sodium: 138 mEq/L (ref 135–145)

## 2010-04-08 LAB — URINALYSIS, ROUTINE W REFLEX MICROSCOPIC
Bilirubin Urine: NEGATIVE
Ketones, ur: NEGATIVE mg/dL
Leukocytes, UA: NEGATIVE
Nitrite: NEGATIVE
Protein, ur: NEGATIVE mg/dL
Specific Gravity, Urine: 1.011 (ref 1.005–1.030)
Urine Glucose, Fasting: NEGATIVE mg/dL
pH: 5.5 (ref 5.0–8.0)

## 2010-04-08 LAB — CBC
HCT: 38.3 % (ref 36.0–46.0)
MCH: 29.6 pg (ref 26.0–34.0)
MCHC: 30.8 g/dL (ref 30.0–36.0)
MCV: 96 fL (ref 78.0–100.0)
Platelets: 190 10*3/uL (ref 150–400)
RBC: 3.99 MIL/uL (ref 3.87–5.11)
RDW: 13.1 % (ref 11.5–15.5)

## 2010-04-08 LAB — DIFFERENTIAL
Basophils Absolute: 0 10*3/uL (ref 0.0–0.1)
Basophils Relative: 1 % (ref 0–1)
Eosinophils Absolute: 0.1 10*3/uL (ref 0.0–0.7)
Eosinophils Relative: 4 % (ref 0–5)
Lymphs Abs: 0.6 10*3/uL — ABNORMAL LOW (ref 0.7–4.0)
Monocytes Absolute: 0.3 10*3/uL (ref 0.1–1.0)
Monocytes Relative: 9 % (ref 3–12)
Neutro Abs: 2.6 10*3/uL (ref 1.7–7.7)
Neutrophils Relative %: 70 % (ref 43–77)

## 2010-04-08 LAB — URINE MICROSCOPIC-ADD ON

## 2010-04-11 ENCOUNTER — Other Ambulatory Visit: Payer: Self-pay | Admitting: General Surgery

## 2010-04-11 ENCOUNTER — Ambulatory Visit (HOSPITAL_COMMUNITY)
Admission: RE | Admit: 2010-04-11 | Discharge: 2010-04-12 | Disposition: A | Discharge status: Home / Self Care | Payer: Medicare Other | Source: Ambulatory Visit | Attending: General Surgery | Admitting: General Surgery

## 2010-04-11 DIAGNOSIS — J4489 Other specified chronic obstructive pulmonary disease: Secondary | ICD-10-CM | POA: Insufficient documentation

## 2010-04-11 DIAGNOSIS — E063 Autoimmune thyroiditis: Secondary | ICD-10-CM | POA: Insufficient documentation

## 2010-04-11 DIAGNOSIS — F17200 Nicotine dependence, unspecified, uncomplicated: Secondary | ICD-10-CM | POA: Insufficient documentation

## 2010-04-11 DIAGNOSIS — Z01812 Encounter for preprocedural laboratory examination: Secondary | ICD-10-CM | POA: Insufficient documentation

## 2010-04-11 DIAGNOSIS — J449 Chronic obstructive pulmonary disease, unspecified: Secondary | ICD-10-CM | POA: Insufficient documentation

## 2010-04-11 DIAGNOSIS — Z01818 Encounter for other preprocedural examination: Secondary | ICD-10-CM | POA: Insufficient documentation

## 2010-04-11 DIAGNOSIS — K219 Gastro-esophageal reflux disease without esophagitis: Secondary | ICD-10-CM | POA: Insufficient documentation

## 2010-04-11 LAB — CALCIUM: Calcium: 7.2 mg/dL — ABNORMAL LOW (ref 8.4–10.5)

## 2010-04-11 NOTE — Op Note (Signed)
NAMEDEBORHA, Beard               ACCOUNT NO.:  [id number]  MEDICAL RECORD NO.:  [id number]           PATIENT TYPE:  O  LOCATION:  DAYL                         FACILITY:  [facility]  PHYSICIAN:  Sharlet Salina T. Liesel Peckenpaugh, M.D.DATE OF BIRTH:  17-Oct-1945  DATE OF PROCEDURE:  04/11/2010 DATE OF DISCHARGE:                              OPERATIVE REPORT   PREOPERATIVE DIAGNOSIS:  Thyroid nodules.  POSTOPERATIVE DIAGNOSIS:  Thyroid nodules.  SURGICAL PROCEDURES:  Total thyroidectomy.  SURGEON:  Lorne Skeens. Afifa Truax, M.D.  ASSISTANT:  Ollen Gross. Vernell Morgans, M.D.  ANESTHESIA:  General.  BRIEF HISTORY:  [name] is a 65 year old female who underwent MRI of the neck for some arthritis issues and incidentally was found to have a thyroid mass.  Subsequent ultrasound was obtained as well which revealed a large right lobe with description of the entire right lobe showing diffuse inhomogeneity, lobulation with poorly defined nodules, hyperemia and calcifications.  Also noted was a solitary 2.7-mm nodule in the left lobe.  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  With this constellation of findings, I have recommended proceeding with total thyroidectomy.  The indications for the procedure, its nature, risks of bleeding, infection, anesthetic complications, injury to the recurrent laryngeal nerves with permanent hoarseness and injury to  parathyroid glands with permanent hypocalcemia were discussed and understood.  The patient brought to the operating room for this procedure.  DESCRIPTION OF OPERATION:  The patient was brought to operating room, placed supine position on the operating room table and general endotracheal anesthesia was induced.  She was positioned with her neck carefully extended.  The entire neck and upper chest were widely sterilely prepped and draped.  Correct patient and  procedure were verified.  She received preoperative antibiotics.  A curvilinear incision was made 2 fingerbreadths above the sternal notch.  Dissection was carried down through the subcutaneous tissue and platysma using cautery.  Subplatysmal flaps were then raised superiorly up to the thyroid cartilage inferiorly down to the sternal notch and laterally out towards the sternocleidomastoid muscles.  The [place]orner retractor was replaced.  The strap muscles were then divided along the midline and dissection carried down on to the thyroid.  The right lobe was approached first.  The anterior surface of the right lobe was exposed with careful cautery and blunt dissection, the strap muscles retracted laterally.  Careful blunt dissection was used to mobilize the lobe.  It was somewhat firm and enlarged.  The middle thyroid vein was identified, dissected free, divided between clip and harmonic scalpel.  Immediately lateral to this, the recurrent laryngeal nerve was clearly identified at this point and protected throughout the remainder of the dissection. The upper pole was then mobilized with careful blunt dissection and the upper pole vessels exposed.  These were divided adjacent to the thyroid gland between the clip and harmonic scalpel.  Some inferior vascular attachments were divided with cautery and harmonic scalpel.  Then staying on the gland, individual branches of the inferior thyroid artery were divided between small clips laterally and harmonic scalpel medially, always  protecting the recurrent laryngeal nerve.  We identified what appeared to be both the superior and inferior parathyroid glands on the side and the these were mobilized away from the gland.  Blood supply left intact and protected.  The gland was mobilized up away from the recurrent laryngeal nerve up on the trachea and then finally ligament of Allyson Sabal was divided with cautery, completely mobilizing back to the isthmus.   Following this, the left lobe was exposed in an identical fashion.  It appeared quite small, almost atrophic.  The middle thyroid vein was divided.  The recurrent laryngeal nerve was identified at the tracheoesophageal groove and protected.  We clearly saw the inferior parathyroid gland on this side which was dissected free and protected.  The gland was dissected and mobilized in identical fashion and then the thyroid gland removed intact.  It was oriented and sent for permanent pathology.  The wound was irrigated, complete hemostasis obtained.  Fibrillar was left in the tracheoesophageal groove and over the trachea.  The strap muscles were then closed in the midline with interrupted 3-0 Vicryl.  Platysma was closed with interrupted 3-0 Vicryl, skin closed with subcuticular Monocryl and Dermabond.  Sponges and needle counts were correct.  The patient taken to the recovery room in good condition.     Lorne Skeens. Letia Guidry, M.D.     Tory Emerald  D:  04/11/2010  T:  04/11/2010  Job:  [id number]  Electronically Signed by Glenna Fellows M.D. on 04/11/2010 04:42:11 PM

## 2010-04-12 LAB — CALCIUM: Calcium: 7.6 mg/dL — ABNORMAL LOW (ref 8.4–10.5)

## 2010-05-06 ENCOUNTER — Other Ambulatory Visit: Payer: Self-pay | Admitting: Internal Medicine

## 2010-05-06 DIAGNOSIS — Z1231 Encounter for screening mammogram for malignant neoplasm of breast: Secondary | ICD-10-CM

## 2010-05-09 ENCOUNTER — Ambulatory Visit
Admission: RE | Admit: 2010-05-09 | Discharge: 2010-05-09 | Disposition: A | Discharge status: Home / Self Care | Payer: Medicare Other | Source: Ambulatory Visit | Attending: Internal Medicine | Admitting: Internal Medicine

## 2010-05-09 DIAGNOSIS — Z1231 Encounter for screening mammogram for malignant neoplasm of breast: Secondary | ICD-10-CM

## 2010-06-24 NOTE — Assessment & Plan Note (Signed)
[facility]                             PULMONARY OFFICE NOTE   NAME:[name], [name]                        MRN:          [id number]  DATE:11/03/2006                            DOB:          [birth year]    HISTORY OF PRESENT ILLNESS:  The patient is a pleasant female patient of  Dr. Roxy Cedar who has a known history of rhinitis, asthmatic bronchitis,  and an active smoker presents today for an acute office visit. Patient  complains over the last 4 days she has had nasal congestion, sinus pain  and pressure, productive cough with thick yellow-greens sputum, and  general malaise. She denies any hemoptysis, chest pain, orthopnea, PND,  recent travel, or antibiotic use.   PAST MEDICAL HISTORY:  Reviewed.   CURRENT MEDICATIONS:  Reviewed.   PHYSICAL EXAMINATION:  Patient is a pleasant female in no acute  distress. She is afebrile with stable vital signs. O2 saturation is 98%  on room air.  HEENT: Nasal mucosa with some mild erythema. Nontender sinuses.  Conjunctivae non injected. TM s normal.  NECK: Supple without cervical adenopathy. No JVD.  LUNG SOUNDS: Clear.  CARDIAC: Regular rate.  ABDOMEN: Soft and nontender.  EXTREMITIES: Warm without any edema.   IMPRESSION AND PLAN:  Acute tracheobronchitis, patient is encouraged on  smoking cessation. She is to begin Omnicef times 7 days and Mucinex DM  twice daily. Saline Afrin nasal spray times 5 days. Instruction sheet  given. Tussionex #4 ounces 1 teaspoon every 12 hours as needed for  cough. Patient was given #4 ounces with no refills given. Patient is  aware of sedating effect.      Rubye Oaks, NP  Electronically Signed      Clinton D. Maple Hudson, MD, Tonny Bollman, FACP  Electronically Signed   TP/MedQ  DD: 11/03/2006  DT: 11/03/2006  Job #: [id number]

## 2010-06-27 NOTE — Assessment & Plan Note (Signed)
[facility]                             PULMONARY OFFICE NOTE   NAME:[name], [name]                        MRN:          [id number]  DATE:01/22/2006                            DOB:          1945-06-20    HISTORY OF PRESENT ILLNESS:  The patient is a 65 year old female patient  of Dr. Maple Hudson.  She has a known history of asthmatic bronchitis,  rhinitis, who continues to smoke against medical advice, who returns  today related to persistent cough, congestion, and runny nose.  The  patient was seen approximately 1 month ago for similar symptoms and  treated with Levaquin 750 x5 days.  The patient reports the symptoms did  resolve.  However, over the last 4 days, the symptoms have returned.  She denies any hemoptysis, orthopnea, PND, or leg swelling.   PAST MEDICAL HISTORY:  Reviewed.   CURRENT MEDICATIONS:  Reviewed.   PHYSICAL EXAM:  The patient is a pleasant female in no acute distress.  She is afebrile with stable vital signs.  HEENT:  Nasal mucosa with some mild redness.  Nontender sinuses.  NECK:  Supple without adenopathy.  No JVD.  LUNGS:  Coarse breath sounds without any wheezing.  No crackles.  CARDIAC:  Regular rate and rhythm.  ABDOMEN:  Soft.  UPPER EXTREMITIES:  Without any edema.   IMPRESSION AND PLAN:  Acute upper respiratory infection.  The patient is  to begin Mucinex DM twice daily.  Endal HD #8 ounces 1 to 2 teaspoons  every 4 to 6 hours as needed for cough.  The patient has been encouraged  on smoking cessation.  Also, I suspect this is more viral in nature and  have recommended that we hold off on antibiotics.  However, I will give  the patient a prescription for Doxycycline x7 days to have on hold if  sputum becomes purulent and symptoms last longer than or worsen after 5  to 7 days.      Rubye Oaks, NP  Electronically Signed      Clinton D. Maple Hudson, MD, Tonny Bollman, FACP  Electronically Signed   TP/MedQ  DD: 01/24/2006   DT: 01/24/2006  Job #: [id number]

## 2010-06-27 NOTE — Op Note (Signed)
[name], [name]               ACCOUNT NO.:  [id number]   MEDICAL RECORD NO.:  [id number]          PATIENT TYPE:  AMB   LOCATION:  ENDO                         FACILITY:  [facility]   PHYSICIAN:  Danise Edge, M.D.   DATE OF BIRTH:  1945/06/14   DATE OF PROCEDURE:  01/14/2004  DATE OF DISCHARGE:                                 OPERATIVE REPORT   PROCEDURE:  Esophagogastroduodenoscopy.   PROCEDURE INDICATION:  [name] is a 65 year old female, born  [birth year].  [name] has symptoms compatible with delayed gastric  emptying; she is experiencing early satiety and persistent vomiting of solid  food.  Her abdominal ultrasound, abdominal CT scan, and upper GI x-ray  series were normal except for duodenal bulb scarring.   ENDOSCOPIST:  Danise Edge, M.D.   PREMEDICATION:  1.  Versed 6 mg.  2.  Demerol 70 mg.   DESCRIPTION OF PROCEDURE:  After obtaining informed consent, [name] was  placed in the left lateral decubitus position.  I administered intravenous  Demerol and intravenous Versed to achieve conscious sedation for the  procedure.  The patient's blood pressure, oxygen saturation, and cardiac  rhythm were monitored throughout the procedure and documented in the medical  record.   The Olympus gastroscope was passed through the posterior hypopharynx into  the proximal esophagus without difficulty.  The hypopharynx and larynx  appeared normal.  I did not visualize the vocal cords.   ESOPHAGOSCOPY:  The proximal and mid segments of the esophagus appear  normal.  The squamocolumnar junction is noted at 38 cm from the incisor  teeth.  [name] has a shallow Schatzki's ring at the esophagogastric  junction.   GASTROSCOPY:  [name] has a small hiatal hernia.  There is a large amount  of liquid and solid food material in the gastric fundus and proximal gastric  body.  The patient did have an episode of vomiting at 3 a.m. this morning.  Retroflexed view  of the gastric cardia was normal.  The gastric body,  antrum, and pylorus appear normal.  There was no obstruction at the pylorus.   DUODENOSCOPY:  There are erosions in the duodenal bulb.  There is scarring  in the distal duodenal bulb.  The orifice was too small to traverse with the  gastroscope, and the second portion of the duodenum was never entered.   BIOPSY:  A biopsy was taken from the distal gastric antrum for CLOtest to  rule out Helicobacter pylori antral gastritis.   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  There are erosions in the duodenal bulb.  A biopsy was taken from the distal gastric antrum for CLOtest.   LABORATORY DATA:  A CBC was drawn in the endoscopy suite.   RECOMMENDATIONS:  I think [name] will require an ulcer operation with  gastrojejunostomy.  I will continue her on her Pepcid therapy which she is  tolerating.  She has tried a couple of proton pump  inhibitor drugs with side  effects.  A CLOtest is pending to rule out Helicobacter pylori antral  gastritis.      MJ/MEDQ  D:  01/14/2004  T:  01/14/2004  Job:  [id number]   cc:   Georgann Housekeeper, MD  [address]  [place]  [place] [address]  Fax: [phone]

## 2010-06-27 NOTE — Op Note (Signed)
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   PROCEDURE:  Laparoscopic truncal vagotomy and gastrojejunostomy.   SURGEON:  Vikki Ports, MD   ASSISTANT:  Thornton Park. Daphine Deutscher, MD   ANESTHESIA:  General.   DESCRIPTION OF PROCEDURE:  Patient was taken to the operating room and  placed in a supine position.  After adequate general anesthesia was induced  via the endotracheal tube, the abdomen was prepped and draped in the normal  sterile fashion.  Using an 11 mm trocar with an Optiview technique in the  left upper quadrant, pneumoperitoneum was obtained.  An additional 12 mm  trocar was placed in the right upper abdomen and additional 11 and 5 mm  trocars were placed in the lower abdomen.  The ligament of Treitz was  identified.  The [facility] liver retractor was placed, and the left lateral  segment was retracted.  The lesser omentum was divided, and a Penrose was  passed around behind the esophagus.  The stomach was retracted inferiorly.  The right posterior and left anterior vagus nerves were identified, clipped,  divided, and resected, sent for pathologic evaluation.  Frozen section  revealed peripheral nerve in both specimens.  I then turned my attention to  the stomach.  A side-to-side gastrojejunostomy was performed approximately  50-60 cm from the ligament of Treitz.  This was done with a GIA stapling  device.  The defect was closed with a running 2-0 Vicryl suture.  Upper  endoscopy  was performed by Dr. Daphine Deutscher after both limbs had been clamped.  This showed no evidence of leak.  The stomach was decompressed.  The NG tube  was placed under direct visualization.  The upper abdomen was copiously  irrigated with antibiotic irrigation secondary to spillage from the gastric  contents.  Tisseel was placed over the anastomosis.  Pneumoperitoneum was  released.  Trocars were removed.  Skin was closed with staples.  The patient  tolerated the procedure well and went to the PACU in good condition.     Gaylyn Rong   KRH/MEDQ  D:  01/21/2004  T:  01/21/2004  Job:  [id number]

## 2010-06-27 NOTE — Discharge Summary (Signed)
NAMEYESLIN, [name]               ACCOUNT NO.:  [id number]   MEDICAL RECORD NO.:  [id number]          PATIENT TYPE:  INP   LOCATION:  0359                         FACILITY:  [facility]   PHYSICIAN:  Vikki Ports, MDDATE OF BIRTH:  26-May-1945   DATE OF ADMISSION:  01/21/2004  DATE OF DISCHARGE:  01/26/2004                                 DISCHARGE SUMMARY   ADMISSION DIAGNOSES:  Gastric outlet obstruction.   DISCHARGE DIAGNOSES:  Gastric outlet obstruction.   DISPOSITION:  Discharged to home.   MEDICATIONS:  Vicodin for pain.   FOLLOW UP:  Followup with me in two weeks.   CONDITION ON DISCHARGE:  Good and improved.   HOSPITAL COURSE:  The patient was admitted, underwent laparoscopy, vagotomy  and gastrojejunostomy. Postoperatively NG tube was left in place until  postoperative day #3.  She started taking clear liquids at that time. She  was then advanced of full liquids which she was able to tolerate and she was  ready for discharge home.  She was discharged home on postoperative day  five.      KRH/MEDQ  D:  02/28/2004  T:  02/28/2004  Job:  [id number]

## 2010-06-27 NOTE — H&P (Signed)
NAMEGIAVONNI, [name]               ACCOUNT NO.:  [id number]   MEDICAL RECORD NO.:  [id number]          PATIENT TYPE:  INP   LOCATION:  0359                         FACILITY:  [facility]   PHYSICIAN:  Vikki Ports, MDDATE OF BIRTH:  October 31, 1945   DATE OF ADMISSION:  01/21/2004  DATE OF DISCHARGE:  01/26/2004                                HISTORY & PHYSICAL   ADMISSION DIAGNOSIS:  Gastric outlet obstruction.   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  After a discussion in the office we opted to perform  laparoscopic vagotomy and gastrojejunostomy.  The patient presents now for  that operation.   PAST MEDICAL HISTORY:  Significant as above.   PAST SURGICAL HISTORY:  Significant for vaginal hysterectomy.   MEDICATIONS:  Flonase as needed.   PHYSICAL EXAMINATION:  GENERAL:  She is an age appropriate white female in  no distress.  Her height is 62 inches.  Her weight is 102 pounds.  VITAL SIGNS:  Her blood pressure is 111/70, heart rate is 68.  HEENT:  Benign. Normocephalic and atraumatic.  Pupils equal, round and  reactive to light.  NECK:  Supple and soft without thyromegaly or cervical adenopathy.  ABDOMEN:  Soft, completely nontender with no hepatosplenomegaly or abdominal  wall hernia defects.  EXTREMITIES:  No cyanosis, clubbing or edema.  HEART:  Regular rate and rhythm without murmurs, rubs or gallops.  LUNGS:  Clear to auscultation and percussion times two.   IMPRESSION:  Gastric outlet obstruction.   PLAN:  Laparoscopy with vagotomy and gastrojejunostomy.      KRH/MEDQ  D:  02/28/2004  T:  02/28/2004  Job:  [id number]

## 2010-06-27 NOTE — Op Note (Signed)
[name], [name]               ACCOUNT NO.:  [id number]   MEDICAL RECORD NO.:  [id number]          PATIENT TYPE:  INP   LOCATION:  0009                         FACILITY:  [facility]   PHYSICIAN:  Thornton Park. Daphine Deutscher, MD  DATE OF BIRTH:  17-Jun-1945   DATE OF PROCEDURE:  01/21/2004  DATE OF DISCHARGE:                                 OPERATIVE REPORT   PREOPERATIVE DIAGNOSIS:  Gastric outlet obstruction.   PROCEDURE:  Esophagogastroscopy.   SURGEON:  Thornton Park. Daphine Deutscher, M.D.   ANESTHESIA:  General.   HISTORY:  [name] is a 66 year old lady undergoing laparoscopic  vagotomy and gastrojejunostomy.   At the completion of the case, the scope was inserted by me and passed  gently down to the EG junction which was at 40 cm.  I then went into the  stomach and insufflated to allow testing of the gastrojejunostomy under  pressure.  This was done with no evidence of a leak.  I then used the scope  to aspirate some of the thick contents of the stomach as well as the gas.  It was then withdrawn without difficulty.  The patient tolerated the  procedure well.     Matt   MBM/MEDQ  D:  01/21/2004  T:  01/21/2004  Job:  [id number]

## 2010-06-27 NOTE — Assessment & Plan Note (Signed)
[facility]                               PULMONARY OFFICE NOTE   NAME:PENDSEShinika, [name]                        MRN:          [id number]  DATE:12/25/2005                            DOB:          Aug 17, 1945    HISTORY OF PRESENT ILLNESS:  Patient is a 65 year old white female patient  of Dr. Roxy Cedar who has a known history of asthmatic bronchitis, rhinitis,  who continues to smoke and continued to smoke as mentioned above.  Patient  presents with a one week history of nasal cannula, sinus pain and pressure,  productive cough, and painful coughing paroxysms.  Patient denies any chest  pain, orthopnea, hemoptysis, recent travel, antibiotic use, weight loss, or  night sweats.   PAST MEDICAL HISTORY:  Reviewed.   CURRENT MEDICATIONS:  Reviewed.   PHYSICAL EXAMINATION:  GENERAL:  Patient is a pleasant female in no acute  distress.  VITAL SIGNS:  She is afebrile with stable vital signs.  O2 saturation is 98%  on room air.  HEENT:  Nasal mucosa is erythematous.  Maxillary sinus tenderness to  percussion.  Posterior pharynx is clear.  NECK:  Supple without adenopathy.  LUNGS:  The lung sounds reveal coarse breath sounds without any wheezing or  crackles.  CARDIAC:  Regular rate and rhythm.  ABDOMEN:  Soft and benign.  EXTREMITIES:  Warm without any edema.   IMPRESSION/PLAN:  Acute sinusitis and bronchitis.  Patient is to begin  Levaquin 750 mg x5 days, Mucinex DM twice daily.  Saline nasal spray along  with her Flonase daily.  Patient is to return here with Dr. Maple Hudson, as  scheduled, or sooner if needed.      Rubye Oaks, NP  Electronically Signed      Clinton D. Maple Hudson, MD, Tonny Bollman, FACP  Electronically Signed   TP/MedQ  DD: 12/25/2005  DT: 12/25/2005  Job #: [id number]

## 2010-06-27 NOTE — Assessment & Plan Note (Signed)
[facility]                             PULMONARY OFFICE NOTE   NAME:PENDSEChantal, Worthey                        MRN:          [id number]  DATE:02/05/2006                            DOB:          12-02-1945    PROBLEM:  1. Rhinitis.  2. Asthmatic bronchitis.  3. Tobacco use.   HISTORY:  She saw the nurse practitioner in mid November and mid  December for nasal congestion, tussive chest pains, and productive  cough, treated first with Levaquin and then with doxycycline.  She says  those resolved completely, then in the last 3 days she has had a  distinct URI.  Her chest at this time is fine, but she is complaining of  frontal and vertex headaches, pain in the right ear, and bloody nasal  discharge.  She had a chest x-ray this summer at Dr. Venita Sheffield office and  has had her flu vaccine.   MEDICATIONS:  1. Multivitamin.  2. Singulair 10 mg.  3. Flonase.  4. BC powders for headache.   DRUG INTOLERANT TO PENICILLIN WITH ITCHING.   She continues to smoke, despite counseling.   OBJECTIVE:  Weight 108 pounds, blood pressure 142/82, pulse regular 99,  room air saturation 98%.  Tympanic membranes look normal, canals are not  red.  There is bilateral turbinate edema with a lot of white mucus.  LUNGS:  Fields sound quiet and clear, I do not hear cough or wheeze.  HEART:  Sounds are regular without murmur.  There is no adenopathy.   IMPRESSION:  Rhinitis, possible sinusitis.   PLAN:  1. Entex PSE 1 b.i.d. as a decongestant for at least 3-5 days.  2. Darvocet #30 one q. 6 hours p.r.n.  3. Omnicef 300 mg x2 daily for 7 days, refill to extend course if      necessary.  4. Schedule return 1 year, earlier p.r.n.  5. Smoking cessation.  We have discusses saline lavage.  If she keeps having problems we may  need to consider computed tomography of sinuses, but this episode has  not persisted long enough yet.     Clinton D. Maple Hudson, MD, Tonny Bollman, FACP  Electronically Signed    CDY/MedQ  DD: 02/06/2006  DT: 02/06/2006  Job #: [id number]   cc:   Georgann Housekeeper, MD

## 2010-09-10 ENCOUNTER — Encounter: Payer: Self-pay | Admitting: Internal Medicine

## 2010-09-15 ENCOUNTER — Ambulatory Visit: Payer: Medicare Other | Admitting: Internal Medicine

## 2010-09-30 ENCOUNTER — Ambulatory Visit (INDEPENDENT_AMBULATORY_CARE_PROVIDER_SITE_OTHER): Payer: Medicare Other | Admitting: Internal Medicine

## 2010-09-30 ENCOUNTER — Encounter: Payer: Self-pay | Admitting: Internal Medicine

## 2010-09-30 VITALS — BP 130/80 | HR 65 | Ht 62.0 in | Wt 86.8 lb

## 2010-09-30 DIAGNOSIS — F17200 Nicotine dependence, unspecified, uncomplicated: Secondary | ICD-10-CM

## 2010-09-30 DIAGNOSIS — J310 Chronic rhinitis: Secondary | ICD-10-CM

## 2010-09-30 DIAGNOSIS — J40 Bronchitis, not specified as acute or chronic: Secondary | ICD-10-CM

## 2010-09-30 NOTE — Assessment & Plan Note (Signed)
She never felt the need for antihistamines this spring- her peak season. Doing well now.   Flyer on [facility] smoking cessation program

## 2010-09-30 NOTE — Assessment & Plan Note (Signed)
I remain concerned, but she is not stopping despite counseling. We will update PFT. I reviewed her vaccination status.

## 2010-09-30 NOTE — Patient Instructions (Signed)
Order- schedule PFT   Dx bronchitis  Flyer on [facility] smoking cessation program

## 2010-09-30 NOTE — Progress Notes (Signed)
  Subjective:    Patient ID: [name], female    DOB: 1946-01-09, 65 y.o.   MRN: [id number]  HPI 09/30/10- 37 yoF smoker followed for allergic rhinitis and recurrent bronchitis. 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  She denies respiratory concerns through the long spring pollen season or summer heat. Still denies morning cough, phlegm or wheeze.  Dr Donette Larry PCP gets annual CXR  No recent PFT Pneumovax at least once  Review of Systems Constitutional:   No-   weight loss, night sweats, fevers, chills, fatigue, lassitude. HEENT:   No-  headaches, difficulty swallowing, tooth/dental problems, sore throat,       No-  sneezing, itching, ear ache, nasal congestion, post nasal drip,  CV:  No-   chest pain, orthopnea, PND, swelling in lower extremities, anasarca, dizziness, palpitations Resp: No-   shortness of breath with exertion or at rest.              No-   productive cough,  No non-productive cough,  No-  coughing up of blood.              No-   change in color of mucus.  No- wheezing.   Skin: No-   rash or lesions. GI:  No-   heartburn, indigestion, abdominal pain, nausea, vomiting, diarrhea,                 change in bowel habits, loss of appetite GU: No-   dysuria, change in color of urine, no urgency or frequency.  No- flank pain. MS:  No-   joint pain or swelling.  No- decreased range of motion.  No- back pain. Neuro- grossly normal to observation, Or:  Psych:  No- change in mood or affect. No depression or anxiety.  No memory loss.      Objective:   Physical Exam General- Alert, Oriented, Affect-appropriate, Distress- none acute    thin Skin- rash-none, lesions- none, excoriation- none Lymphadenopathy- none Head- atraumatic            Eyes- Gross vision intact, PERRLA, conjunctivae clear secretions    Periorbital edema            Ears- Hearing, canals normal            Nose- Clear, no-Septal dev, mucus, polyps,  erosion, perforation             Throat- Mallampati II , mucosa clear , drainage- none, tonsils- atrophic Neck- flexible , trachea midline, no stridor , thyroid nl, carotid no bruit Chest - symmetrical excursion , unlabored           Heart/CV- RRR , no murmur , no gallop  , no rub, nl s1 s2                           - JVD- none , edema- none, stasis changes- none, varices- none           Lung- clear to P&A, wheeze- none, cough- none , dullness-none, rub- none    Very clear           Chest wall-  Abd- tender-no, distended-no, bowel sounds-present, HSM- no Br/ Gen/ Rectal- Not done, not indicated Extrem- cyanosis- none, clubbing, none, atrophy- none, strength- nl Neuro- grossly intact to observation         Assessment & Plan:

## 2010-10-14 ENCOUNTER — Ambulatory Visit (INDEPENDENT_AMBULATORY_CARE_PROVIDER_SITE_OTHER): Payer: Medicare Other | Admitting: Internal Medicine

## 2010-10-14 DIAGNOSIS — J40 Bronchitis, not specified as acute or chronic: Secondary | ICD-10-CM

## 2010-10-14 LAB — PULMONARY FUNCTION TEST

## 2010-10-14 NOTE — Progress Notes (Signed)
PFT done today. 

## 2010-12-16 ENCOUNTER — Other Ambulatory Visit: Payer: Self-pay | Admitting: Internal Medicine

## 2010-12-16 ENCOUNTER — Ambulatory Visit
Admission: RE | Admit: 2010-12-16 | Discharge: 2010-12-16 | Disposition: A | Discharge status: Home / Self Care | Payer: Medicare Other | Source: Ambulatory Visit | Attending: Internal Medicine | Admitting: Internal Medicine

## 2010-12-16 DIAGNOSIS — R110 Nausea: Secondary | ICD-10-CM

## 2011-02-11 ENCOUNTER — Telehealth: Payer: Self-pay | Admitting: Internal Medicine

## 2011-02-11 MED ORDER — AZITHROMYCIN 250 MG PO TABS: ORAL_TABLET | ORAL | Status: AC

## 2011-02-11 NOTE — Telephone Encounter (Signed)
I spoke with pt and is aware of CDY recs. Rx has been sent to pharmacy and pt is aware. Nothing further is needed

## 2011-02-11 NOTE — Telephone Encounter (Signed)
Pt c/o intermittent sore throat, runny nose, "no energy", low grade fever T-101 x 1 week. Coughed up green mucus last PM. Has appointment with CDY on Friday and does not feel she can wait that long. Please advise, thank you.   Allergies  Allergen Reactions  . Levofloxacin     REACTION: GI upset, mouth/throat sores  . Penicillins     REACTION: itch   [facility]

## 2011-02-11 NOTE — Telephone Encounter (Signed)
Per CY okay to give Zpak #1 take as directed no refills. 

## 2011-02-13 ENCOUNTER — Ambulatory Visit (INDEPENDENT_AMBULATORY_CARE_PROVIDER_SITE_OTHER): Payer: Medicare Other | Admitting: Internal Medicine

## 2011-02-13 ENCOUNTER — Encounter: Payer: Self-pay | Admitting: Internal Medicine

## 2011-02-13 VITALS — BP 122/70 | HR 65 | Wt 91.6 lb

## 2011-02-13 DIAGNOSIS — F17200 Nicotine dependence, unspecified, uncomplicated: Secondary | ICD-10-CM

## 2011-02-13 DIAGNOSIS — J209 Acute bronchitis, unspecified: Secondary | ICD-10-CM

## 2011-02-13 MED ORDER — METHYLPREDNISOLONE ACETATE 40 MG/ML IJ SUSP
40.0000 mg | Freq: Once | INTRAMUSCULAR | Status: AC
  Administered 2011-02-13: 40 mg via INTRAMUSCULAR

## 2011-02-13 NOTE — Patient Instructions (Signed)
Depo 40  Finish the Zpak  Please don't give up the idea of stopping smoking

## 2011-02-13 NOTE — Progress Notes (Signed)
Patient ID: [name], female    DOB: April 10, 1945, 66 y.o.   MRN: [id number]  HPI 09/30/10- 66 yoF smoker followed for allergic rhinitis and recurrent bronchitis. 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  She denies respiratory concerns through the long spring pollen season or summer heat. Still denies morning cough, phlegm or wheeze.  Dr Donette Larry PCP gets annual CXR  No recent PFT Pneumovax at least once  02/13/11- 66 yoF smoker followed for allergic rhinitis and recurrent bronchitis. Has had a recent sore throat, green discharge from nose and chest and low-grade fever. She is now taking a Z-Pak and feels better but asks Depo-Medrol. Her daughter is using a Neti pot and we discussed saline lavage. Since last here had thyroidectomy in March, uncomplicated. PFT 10/14/10-reviewed with her, showing mild/ moderate obstructive airways disease with insignificant response to bronchodilator, hyperinflation and air trapping, normal diffusion. FEV1/FVC 0.65. I used this as a basis for talking again with her about smoking cessation today.  Review of Systems Constitutional:   No-   weight loss, night sweats, fevers, chills, fatigue, lassitude. HEENT:   No-  headaches, difficulty swallowing, tooth/dental problems, sore throat,       No-  sneezing, itching, ear ache,  +nasal congestion, post nasal drip,  CV:  No-   chest pain, orthopnea, PND, swelling in lower extremities, anasarca, dizziness, palpitations Resp: little acute  shortness of breath with exertion or at rest.              + productive cough,  No non-productive cough,  No-  coughing up of blood.              + change in color of mucus.  No- wheezing.   Skin: No-   rash or lesions. GI:  No-   heartburn, indigestion, abdominal pain, nausea, vomiting, diarrhea,                 change in bowel habits, loss of appetite GU:  MS:  No-   joint pain or swelling.  No- decreased range of  motion.  No- back pain. Neuro- grossly normal to observation, Or:  Psych:  No- change in mood or affect. No depression or anxiety.  No memory loss.      Objective:   Physical Exam General- Alert, Oriented, Affect-appropriate, Distress- none acute    thin Skin- rash-none, lesions- none, excoriation- none Lymphadenopathy- none Head- atraumatic            Eyes- Gross vision intact, PERRLA, conjunctivae clear secretions    Periorbital edema            Ears- Hearing, canals normal            Nose- Clear, no-Septal dev, mucus, polyps, erosion, perforation             Throat- Mallampati II , mucosa clear , drainage- none, tonsils- atrophic Neck- flexible , trachea midline, no stridor , thyroid nl, carotid no bruit Chest - symmetrical excursion , unlabored           Heart/CV- RRR , no murmur , no gallop  , no rub, nl s1 s2                           - JVD- none , edema- none, stasis changes- none, varices- none           Lung-  decreased, wheeze- none, cough- none , dullness-none, rub- none    Very clear           Chest wall-  Abd- Br/ Gen/ Rectal- Not done, not indicated Extrem- cyanosis- none, clubbing, none, atrophy- none, strength- nl Neuro- grossly intact to observation

## 2011-02-15 NOTE — Assessment & Plan Note (Signed)
We continue to counsel her, pointing out medical incentives to quit, available support techniques. She has not made a decision that she wants to stop smoking.

## 2011-02-15 NOTE — Assessment & Plan Note (Signed)
Acute upper respiratory infection with bronchitis, typical of a viral syndromes going to the community now. She feels better having started a Z-Pak. We discussed supportive care. Discussed steroid therapy before giving Depo-Medrol.

## 2011-08-28 ENCOUNTER — Other Ambulatory Visit: Payer: Self-pay | Admitting: Internal Medicine

## 2011-08-28 DIAGNOSIS — Z1231 Encounter for screening mammogram for malignant neoplasm of breast: Secondary | ICD-10-CM

## 2011-09-18 ENCOUNTER — Ambulatory Visit
Admission: RE | Admit: 2011-09-18 | Discharge: 2011-09-18 | Disposition: A | Discharge status: Home / Self Care | Payer: Medicare Other | Source: Ambulatory Visit | Attending: Internal Medicine | Admitting: Internal Medicine

## 2011-09-18 DIAGNOSIS — Z1231 Encounter for screening mammogram for malignant neoplasm of breast: Secondary | ICD-10-CM

## 2011-09-30 ENCOUNTER — Encounter: Payer: Self-pay | Admitting: Internal Medicine

## 2011-09-30 ENCOUNTER — Ambulatory Visit (INDEPENDENT_AMBULATORY_CARE_PROVIDER_SITE_OTHER): Payer: Medicare Other | Admitting: Internal Medicine

## 2011-09-30 ENCOUNTER — Ambulatory Visit (INDEPENDENT_AMBULATORY_CARE_PROVIDER_SITE_OTHER)
Admission: RE | Admit: 2011-09-30 | Discharge: 2011-09-30 | Disposition: A | Discharge status: Home / Self Care | Payer: Medicare Other | Source: Ambulatory Visit | Attending: Internal Medicine | Admitting: Internal Medicine

## 2011-09-30 VITALS — BP 116/76 | HR 79 | Ht 62.5 in | Wt 85.4 lb

## 2011-09-30 DIAGNOSIS — J310 Chronic rhinitis: Secondary | ICD-10-CM

## 2011-09-30 DIAGNOSIS — J45909 Unspecified asthma, uncomplicated: Secondary | ICD-10-CM

## 2011-09-30 DIAGNOSIS — J209 Acute bronchitis, unspecified: Secondary | ICD-10-CM

## 2011-09-30 MED ORDER — DOXYCYCLINE HYCLATE 100 MG PO TABS: ORAL_TABLET | ORAL | Status: DC

## 2011-09-30 MED ORDER — FLUTICASONE PROPIONATE 50 MCG/ACT NA SUSP: NASAL | Status: DC

## 2011-09-30 NOTE — Progress Notes (Signed)
Patient ID: [name], female    DOB: [birth year], 66 y.o.   MRN: [id number]  HPI 09/30/10- 66 yoF smoker followed for allergic rhinitis and recurrent bronchitis. 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  She denies respiratory concerns through the long spring pollen season or summer heat. Still denies morning cough, phlegm or wheeze.  Dr Donette Larry PCP gets annual CXR  No recent PFT Pneumovax at least once  02/13/11- 66 yoF smoker followed for allergic rhinitis and recurrent bronchitis. Has had a recent sore throat, green discharge from nose and chest and low-grade fever. She is now taking a Z-Pak and feels better but asks Depo-Medrol. Her daughter is using a Neti pot and we discussed saline lavage. Since last here had thyroidectomy in March, uncomplicated. PFT 10/14/10-reviewed with her, showing mild/ moderate obstructive airways disease with insignificant response to bronchodilator, hyperinflation and air trapping, normal diffusion. FEV1/FVC 0.65. I used this as a basis for talking again with her about smoking cessation today.  09/30/11- 66 yoF smoker followed for allergic rhinitis and recurrent bronchitis. Last week had bout of colored phelgm-green and thick in color, felt like fever but no temps when checked. Took prednisone medrol pak and this helped with symptoms. Back to baseline now but noting some vertex/tension headache, nasal congestion. Asks refill fluticasone. Still smoking. We discussed this again. She is not motivated to stop despite my efforts.   Review of Systems- see HPI Constitutional:   No-   weight loss, night sweats, fevers, chills, fatigue, lassitude. HEENT:   +  headaches, no-difficulty swallowing, tooth/dental problems, sore throat,       No-  sneezing, itching, ear ache,  +nasal congestion, post nasal drip,  CV:  No-   chest pain, orthopnea, PND, swelling in lower extremities, anasarca, dizziness,  palpitations Resp: little acute  shortness of breath with exertion or at rest.              No- productive cough,  No non-productive cough,  No-  coughing up of blood.              + change in color of mucus.  No- wheezing.   Skin: No-   rash or lesions. GI:  No-   heartburn, indigestion, abdominal pain, nausea, vomiting, diarrhea,                 change in bowel habits, loss of appetite GU:  MS:  No-   joint pain or swelling.  No- decreased range of motion.  No- back pain. Neuro- nothing unusual Psych:  No- change in mood or affect. No depression or anxiety.  No memory loss.  Objective:   Physical Exam General- Alert, Oriented, Affect-appropriate, Distress- none acute    thin Skin- rash-none, lesions- none, excoriation- none Lymphadenopathy- none Head- atraumatic            Eyes- Gross vision intact, PERRLA, conjunctivae clear secretions    Periorbital edema            Ears- Hearing, canals normal            Nose- +mucosa looks red, no-Septal dev, mucus, polyps, erosion, perforation             Throat- Mallampati II , mucosa clear , drainage- none, tonsils- atrophic Neck- flexible , trachea midline, no stridor , thyroid nl, carotid no bruit Chest - symmetrical excursion , unlabored  Heart/CV- RRR , no murmur , no gallop  , no rub, nl s1 s2                           - JVD- none , edema- none, stasis changes- none, varices- none           Lung- decreased, wheeze- none, cough- none , dullness-none, rub- none    Very clear           Chest wall-  Abd- Br/ Gen/ Rectal- Not done, not indicated Extrem- cyanosis- none, clubbing, none, atrophy- none, strength- nl Neuro- grossly intact to observation

## 2011-09-30 NOTE — Patient Instructions (Addendum)
Script for flonase/ fluticasone nasal spray  Script for doxycycline antibiotic  Order- CXR- asthma with bronchitis  Please call as needed

## 2011-10-07 NOTE — Assessment & Plan Note (Addendum)
Recent exacerbation of chronic rhinitis. I think this is mostly a vasomotor rhinitis with tobacco irritation Plan-refill fluticasone

## 2011-10-07 NOTE — Assessment & Plan Note (Addendum)
Recent acute exacerbation sounds like a viral illness now largely resolved. She has some underlying emphysema but is relatively asymptomatic at baseline despite her ongoing smoking We discussed options and will let her hold a prescription for doxycycline

## 2011-10-09 ENCOUNTER — Telehealth: Payer: Self-pay | Admitting: Internal Medicine

## 2011-10-09 NOTE — Progress Notes (Signed)
Quick Note:  LMTCB ______ 

## 2011-10-09 NOTE — Telephone Encounter (Signed)
Spoke with pt and notified of results of cxr. She verbalized understanding and states nothing further needed.

## 2011-10-09 NOTE — Progress Notes (Signed)
Quick Note:  Spoke with pt and notified of results ______ 

## 2011-10-21 ENCOUNTER — Encounter (HOSPITAL_COMMUNITY): Payer: Self-pay

## 2011-10-21 ENCOUNTER — Inpatient Hospital Stay (HOSPITAL_COMMUNITY)
Admission: EM | Admit: 2011-10-21 | Discharge: 2011-10-23 | DRG: 378 | Disposition: A | Discharge status: Home / Self Care | Payer: Medicare Other | Attending: Internal Medicine | Admitting: Internal Medicine

## 2011-10-21 DIAGNOSIS — K254 Chronic or unspecified gastric ulcer with hemorrhage: Principal | ICD-10-CM | POA: Diagnosis present

## 2011-10-21 DIAGNOSIS — D62 Acute posthemorrhagic anemia: Secondary | ICD-10-CM | POA: Diagnosis present

## 2011-10-21 DIAGNOSIS — D649 Anemia, unspecified: Secondary | ICD-10-CM | POA: Diagnosis present

## 2011-10-21 DIAGNOSIS — K922 Gastrointestinal hemorrhage, unspecified: Secondary | ICD-10-CM | POA: Diagnosis present

## 2011-10-21 DIAGNOSIS — E039 Hypothyroidism, unspecified: Secondary | ICD-10-CM | POA: Diagnosis present

## 2011-10-21 DIAGNOSIS — M1990 Unspecified osteoarthritis, unspecified site: Secondary | ICD-10-CM | POA: Diagnosis present

## 2011-10-21 DIAGNOSIS — F17200 Nicotine dependence, unspecified, uncomplicated: Secondary | ICD-10-CM | POA: Diagnosis present

## 2011-10-21 HISTORY — DX: Anemia, unspecified: D64.9

## 2011-10-21 HISTORY — DX: Thyrotoxicosis, unspecified without thyrotoxic crisis or storm: E05.90

## 2011-10-21 HISTORY — DX: Disorder of thyroid, unspecified: E07.9

## 2011-10-21 HISTORY — DX: Unspecified osteoarthritis, unspecified site: M19.90

## 2011-10-21 HISTORY — DX: Gastro-esophageal reflux disease without esophagitis: K21.9

## 2011-10-21 LAB — COMPREHENSIVE METABOLIC PANEL
ALT: 9 U/L (ref 0–35)
AST: 9 U/L (ref 0–37)
CO2: 23 mEq/L (ref 19–32)
Chloride: 109 mEq/L (ref 96–112)
Creatinine, Ser: 0.55 mg/dL (ref 0.50–1.10)
GFR calc non Af Amer: >90 mL/min (ref >90)
Sodium: 140 mEq/L (ref 135–145)
Total Bilirubin: <0.1 mg/dL — ABNORMAL LOW (ref 0.3–1.2)

## 2011-10-21 LAB — CBC WITH DIFFERENTIAL/PLATELET
Eosinophils Absolute: 0.1 10*3/uL (ref 0.0–0.7)
MCH: 30.8 pg (ref 26.0–34.0)
MCHC: 32 g/dL (ref 30.0–36.0)
Monocytes Absolute: 0.4 10*3/uL (ref 0.1–1.0)
Neutrophils Relative %: 80 % — ABNORMAL HIGH (ref 43–77)
Platelets: 202 10*3/uL (ref 150–400)

## 2011-10-21 LAB — CBC
HCT: 28.3 % — ABNORMAL LOW (ref 36.0–46.0)
MCHC: 33.9 g/dL (ref 30.0–36.0)
Platelets: 203 10*3/uL (ref 150–400)
RDW: 15.3 % (ref 11.5–15.5)
WBC: 5 10*3/uL (ref 4.0–10.5)

## 2011-10-21 LAB — ABO/RH: ABO/RH(D): O NEG

## 2011-10-21 LAB — LIPASE, BLOOD: Lipase: 15 U/L (ref 11–59)

## 2011-10-21 MED ORDER — MORPHINE SULFATE 2 MG/ML IJ SOLN: 2.0000 mg | Freq: Once | INTRAMUSCULAR | Status: DC

## 2011-10-21 MED ORDER — ACETAMINOPHEN 325 MG PO TABS: 650.0000 mg | ORAL_TABLET | Freq: Four times a day (QID) | ORAL | Status: DC | PRN

## 2011-10-21 MED ORDER — SODIUM CHLORIDE 0.9 % IV SOLN
INTRAVENOUS | Status: DC
  Administered 2011-10-21: 08:00:00 via INTRAVENOUS

## 2011-10-21 MED ORDER — HYDROCODONE-ACETAMINOPHEN 5-325 MG PO TABS
1.0000 | ORAL_TABLET | Freq: Four times a day (QID) | ORAL | Status: DC | PRN
  Administered 2011-10-21 – 2011-10-23 (×7): 1 via ORAL
  Filled 2011-10-21 (×7): qty 1

## 2011-10-21 MED ORDER — SODIUM CHLORIDE 0.9 % IV SOLN
INTRAVENOUS | Status: DC
  Administered 2011-10-21 – 2011-10-22 (×2): via INTRAVENOUS

## 2011-10-21 MED ORDER — FLUTICASONE PROPIONATE 50 MCG/ACT NA SUSP
1.0000 | Freq: Every day | NASAL | Status: DC
  Administered 2011-10-21 – 2011-10-22 (×2): 1 via NASAL
  Filled 2011-10-21: qty 16

## 2011-10-21 MED ORDER — TRAMADOL HCL 50 MG PO TABS
50.0000 mg | ORAL_TABLET | Freq: Once | ORAL | Status: AC
  Administered 2011-10-21: 50 mg via ORAL
  Filled 2011-10-21: qty 1

## 2011-10-21 MED ORDER — HYDROMORPHONE HCL PF 1 MG/ML IJ SOLN
0.5000 mg | INTRAMUSCULAR | Status: DC | PRN
  Administered 2011-10-21 – 2011-10-22 (×3): 0.5 mg via INTRAVENOUS
  Filled 2011-10-21 (×3): qty 1

## 2011-10-21 MED ORDER — LEVOTHYROXINE SODIUM 112 MCG PO TABS
112.0000 ug | ORAL_TABLET | Freq: Every day | ORAL | Status: DC
  Administered 2011-10-23: 112 ug via ORAL
  Filled 2011-10-21 (×3): qty 1

## 2011-10-21 MED ORDER — PANTOPRAZOLE SODIUM 40 MG IV SOLR
40.0000 mg | Freq: Once | INTRAVENOUS | Status: AC
  Administered 2011-10-21: 40 mg via INTRAVENOUS
  Filled 2011-10-21: qty 40

## 2011-10-21 MED ORDER — FUROSEMIDE 10 MG/ML IJ SOLN
20.0000 mg | Freq: Once | INTRAMUSCULAR | Status: AC
  Administered 2011-10-21: 20 mg via INTRAVENOUS
  Filled 2011-10-21: qty 2

## 2011-10-21 MED ORDER — SODIUM CHLORIDE 0.9 % IV SOLN: INTRAVENOUS | Status: DC

## 2011-10-21 MED ORDER — SODIUM CHLORIDE 0.9 % IJ SOLN
3.0000 mL | Freq: Two times a day (BID) | INTRAMUSCULAR | Status: DC
  Administered 2011-10-21 – 2011-10-22 (×2): 3 mL via INTRAVENOUS

## 2011-10-21 MED ORDER — ACETAMINOPHEN 650 MG RE SUPP: 650.0000 mg | Freq: Four times a day (QID) | RECTAL | Status: DC | PRN

## 2011-10-21 MED ORDER — PANTOPRAZOLE SODIUM 40 MG IV SOLR
40.0000 mg | Freq: Two times a day (BID) | INTRAVENOUS | Status: DC
  Administered 2011-10-21 (×2): 40 mg via INTRAVENOUS
  Filled 2011-10-21 (×4): qty 40

## 2011-10-21 MED ORDER — GI COCKTAIL ~~LOC~~
30.0000 mL | Freq: Once | ORAL | Status: AC
  Administered 2011-10-21: 30 mL via ORAL
  Filled 2011-10-21: qty 30

## 2011-10-21 NOTE — Progress Notes (Signed)
Received pt. From ED,pt. Alert and oriented,from home,pt. Ambulates independently.Pt. Was place on telemetry.keep monitoring pt. And assessing his needs.

## 2011-10-21 NOTE — Progress Notes (Signed)
Disposition Note  [name], is a 66 y.o. female,   MRN: [id number]  -  DOB - Mar 04, 1945  Outpatient Primary MD for the patient is HUSAIN,KARRAR, MD   Blood pressure 118/52, pulse 85, temperature 97.5 F (36.4 C), temperature source Oral, resp. rate 11, SpO2 99.00%.  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  She also has arthritis and has been on prednisone recently.  She comes to the ED with epigastric pain radiating to her back and black stools.   Hgb. 4.9, MCV 96.2, stool brown G+.  4 units of PRBCs have been ordered to be transfused  Per EDP she is completely stable.  I've requested a tele bed.  Called Dr. Donette Larry to assume her care in the am, and consulted [facility] for possible EGD today / tomorrow.  She is currently NPO.    Algis Downs, PA-C [facility] Pager: [phone]

## 2011-10-21 NOTE — ED Provider Notes (Addendum)
History     CSN: [id number]  Arrival date & time 10/21/11  4098   First MD Initiated Contact with Patient 10/21/11 0753      Chief Complaint  Patient presents with  . Epigastric pain     (Consider location/radiation/quality/duration/timing/severity/associated sxs/prior treatment) The history is provided by the patient.   66 year old, female, with a history of rheumatoid arthritis, and peptic ulcer disease, presents emergency department complaining of epigastric pain.  That radiates to her back, along with black stools.  She states that she recently had a course of steroids.  For her arthritis.  Since then, she has had this discomfort in the epigastric area, and black stools.  She denies nausea, vomiting, fevers, chills, cough, or shortness of breath.  She denies dysuria.  She denies bruising.  She is not taking any anticoagulants.  She does not drink alcohol.  Past Medical History  Diagnosis Date  . Tobacco use disorder   . Chronic rhinitis   . Acute bronchitis   . GERD (gastroesophageal reflux disease)   . Thyroid disease     Past Surgical History  Procedure Date  . Total abdominal hysterectomy   . Gastric outlet obstruction release     7 years ago  . Thyroidectomy 2012    Family History  Problem Relation Age of Onset  . Cancer Father   . Cirrhosis Mother     History  Substance Use Topics  . Smoking status: Current Every Day Smoker -- 0.5 packs/day    Types: Cigarettes  . Smokeless tobacco: Not on file   Comment: 1 ppd  . Alcohol Use: No    OB History    Grav Para Term Preterm Abortions TAB SAB Ect Mult Living                  Review of Systems  Constitutional: Negative for fever and chills.  Respiratory: Negative for cough and shortness of breath.   Cardiovascular: Negative for chest pain and leg swelling.  Gastrointestinal: Positive for abdominal pain and blood in stool. Negative for nausea, vomiting and diarrhea.  Genitourinary: Negative for hematuria.   Musculoskeletal: Positive for back pain.  Skin: Negative for rash.  Neurological: Positive for light-headedness.  Hematological: Does not bruise/bleed easily.  Psychiatric/Behavioral: Negative for confusion.  All other systems reviewed and are negative.    Allergies  Doxycycline; Levofloxacin; Nexium; and Penicillins  Home Medications   Current Outpatient Rx  Name Route Sig Dispense Refill  . FLUTICASONE PROPIONATE 50 MCG/ACT NA SUSP  1-2 puffs each nostril once daily at bedtime 16 g prn  . HYDROCODONE-ACETAMINOPHEN 5-500 MG PO TABS Oral Take 1 tablet by mouth every 6 (six) hours as needed. Osteoarthritis pain Takes with Tramadol    . LEVOTHYROXINE SODIUM 112 MCG PO TABS      . MULTIVITAMINS PO CAPS Oral Take 1 capsule by mouth daily.      . TRAMADOL HCL 50 MG PO TABS Oral Take 50 mg by mouth every 6 (six) hours as needed. Osteoarthritis pain Takes with Vicodin    . FEXOFENADINE HCL 180 MG PO TABS Oral Take 180 mg by mouth daily as needed. Seasonal allergies    . PSEUDOEPHEDRINE HCL 30 MG PO TABS Oral Take 30 mg by mouth every 4 (four) hours as needed. Seasonal allergies      BP 118/52  Pulse 85  Temp 97.5 F (36.4 C) (Oral)  Resp 11  SpO2 99%  Physical Exam  Nursing note and vitals reviewed. Constitutional:  She is oriented to person, place, and time. She appears well-developed and well-nourished. No distress.  HENT:  Head: Normocephalic and atraumatic.  Eyes: EOM are normal. Right eye exhibits no discharge. Left eye exhibits no discharge.       Pale conjunctiva  Neck: Normal range of motion. Neck supple.  Cardiovascular: Normal rate and intact distal pulses.   No murmur heard.      Capillary refill 2 seconds  Pulmonary/Chest: Effort normal and breath sounds normal.  Abdominal: Soft. Bowel sounds are normal. She exhibits no distension. There is tenderness.        Mild epigastric tenderness, with no peritoneal sign  Genitourinary: Guaiac positive stool.       Rectal  examination performed with nurse chaperone  Musculoskeletal: Normal range of motion.  Neurological: She is alert and oriented to person, place, and time.  Skin: Skin is warm and dry. There is pallor.  Psychiatric: She has a normal mood and affect. Thought content normal.    ED Course  Procedures (including critical care time) 66 year old, female, with known history of peptic ulcer disease, presents with epigastric pain.  That radiates to her back, and black stools.  She is pale, with mild abdominal tenderness.  On concerned she has a GI bleed.  We will perform laboratory testing, and treat her symptomatically.  Labs Reviewed  OCCULT BLOOD, POC DEVICE  COMPREHENSIVE METABOLIC PANEL  CBC WITH DIFFERENTIAL  LIPASE, BLOOD   No results found.   No diagnosis found.  ECG. Normal sinus rhythm at 72 beats per minute.  normal axis. Normal intervals. Nonspecific T-wave changes   CRITICAL CARE Performed by: Cheri Guppy P   Total critical care time: 30 min  Critical care time was exclusive of separately billable procedures and treating other patients.  Critical care was necessary to treat or prevent imminent or life-threatening deterioration.  Critical care was time spent personally by me on the following activities: development of treatment plan with patient and/or surrogate as well as nursing, discussions with consultants, evaluation of patient's response to treatment, examination of patient, obtaining history from patient or surrogate, ordering and performing treatments and interventions, ordering and review of laboratory studies, ordering and review of radiographic studies, pulse oximetry and re-evaluation of patient's condition.           MDM  Epigastric pain, with black stools        Cheri Guppy, MD 10/21/11 0845  Cheri Guppy, MD 10/21/11 4098  Cheri Guppy, MD 10/21/11 1011

## 2011-10-21 NOTE — ED Notes (Signed)
Called ems with a cc of epigastric pain radiating to her mid back x 1 week.  States that over the last 2 days dark tarry stools noted.  Ems reports pt is jaundiced appearing.  18 ga Lfa  800 cc bolus.   Was on Nexium x 6 years and recently stopped medication.   bp 140/60 hr 80 nsr.

## 2011-10-21 NOTE — Consult Note (Signed)
[facility] Consultation Note  Referring Provider:  Dr. Zannie Cove Primary Care Physician:  Georgann Housekeeper, MD Primary Gastroenterologist:  Dr. Danise Edge  Reason for Consultation:  Anemia, melena  HPI: [name] is a 66 y.o. female we are asked to see for anemia.  For the past several weeks, she has had progressive fatigue and weakness.  For the past several days, she has had intermittent melena.  Some nausea, some possible dysphagia.  No abdominal pain, change in bowel habits.  Weight is stable, but lost about 20 lbs after gastric surgery for gastric outlet obstruction from peptic ulcer disease in 2005.  Continues to take ASA intermittently for headaches.  EGD November 2012 showed Bilroth-2 anatomy, otherwise normal.  Colonoscopy January 2010 incomplete, to level of sigmoid colon, with diverticulosis seen; barium enema at that time showed redundant colon, otherwise normal.   Past Medical History  Diagnosis Date  . Tobacco use disorder   . Chronic rhinitis   . Acute bronchitis   . GERD (gastroesophageal reflux disease)   . Thyroid disease     Past Surgical History  Procedure Date  . Total abdominal hysterectomy   . Gastric outlet obstruction release     7 years ago  . Thyroidectomy 2012    Prior to Admission medications   Medication Sig Start Date End Date Taking? Authorizing Provider  fluticasone (FLONASE) 50 MCG/ACT nasal spray 1-2 puffs each nostril once daily at bedtime 09/30/11 09/29/12 Yes Clinton D Young, MD  HYDROcodone-acetaminophen (VICODIN) 5-500 MG per tablet Take 1 tablet by mouth every 6 (six) hours as needed. Osteoarthritis pain Takes with Tramadol 01/06/11  Yes Historical Provider, MD  levothyroxine (SYNTHROID, LEVOTHROID) 112 MCG tablet  01/05/11  Yes Historical Provider, MD  Multiple Vitamin (MULTIVITAMIN) capsule Take 1 capsule by mouth daily.     Yes Historical Provider, MD  traMADol (ULTRAM) 50 MG tablet Take 50 mg by mouth every 6 (six)  hours as needed. Osteoarthritis pain Takes with Vicodin   Yes Historical Provider, MD  fexofenadine (ALLEGRA) 180 MG tablet Take 180 mg by mouth daily as needed. Seasonal allergies    Historical Provider, MD  pseudoephedrine (SUDAFED) 30 MG tablet Take 30 mg by mouth every 4 (four) hours as needed. Seasonal allergies    Historical Provider, MD    Current Facility-Administered Medications  Medication Dose Route Frequency Provider Last Rate Last Dose  . 0.9 %  sodium chloride infusion   Intravenous Continuous Cheri Guppy, MD 125 mL/hr at 10/21/11 1478    . acetaminophen (TYLENOL) tablet 650 mg  650 mg Oral Q6H PRN Zannie Cove, MD       Or  . acetaminophen (TYLENOL) suppository 650 mg  650 mg Rectal Q6H PRN Zannie Cove, MD      . fluticasone (FLONASE) 50 MCG/ACT nasal spray 1 spray  1 spray Each Nare QHS Zannie Cove, MD      . furosemide (LASIX) injection 20 mg  20 mg Intravenous Once Zannie Cove, MD      . gi cocktail (Maalox,Lidocaine,Donnatal)  30 mL Oral Once Cheri Guppy, MD   30 mL at 10/21/11 0821  . HYDROcodone-acetaminophen (NORCO/VICODIN) 5-325 MG per tablet 1 tablet  1 tablet Oral Q6H PRN Cheri Guppy, MD   1 tablet at 10/21/11 1157  . HYDROmorphone (DILAUDID) injection 0.5 mg  0.5 mg Intravenous Q4H PRN Zannie Cove, MD      . levothyroxine (SYNTHROID, LEVOTHROID) tablet 112 mcg  112 mcg Oral QAC breakfast Zannie Cove, MD      .  pantoprazole (PROTONIX) injection 40 mg  40 mg Intravenous Once Cheri Guppy, MD   40 mg at 10/21/11 0821  . pantoprazole (PROTONIX) injection 40 mg  40 mg Intravenous Q12H Zannie Cove, MD      . sodium chloride 0.9 % injection 3 mL  3 mL Intravenous Q12H Zannie Cove, MD      . traMADol Janean Sark) tablet 50 mg  50 mg Oral Once Cheri Guppy, MD   50 mg at 10/21/11 1025  . DISCONTD: 0.9 %  sodium chloride infusion   Intravenous STAT Cheri Guppy, MD      . DISCONTD: morphine 2 MG/ML injection 2 mg  2 mg  Intravenous Once Cheri Guppy, MD        Allergies as of 10/21/2011 - Review Complete 10/21/2011  Allergen Reaction Noted  . Doxycycline Itching 10/21/2011  . Levofloxacin    . Nexium (esomeprazole magnesium) Swelling 10/21/2011  . Penicillins      Family History  Problem Relation Age of Onset  . Cancer Father   . Cirrhosis Mother     History   Social History  . Marital Status: Divorced    Spouse Name: N/A    Number of Children: N/A  . Years of Education: N/A   Occupational History  . Not on file.   Social History Main Topics  . Smoking status: Current Every Day Smoker -- 0.5 packs/day    Types: Cigarettes  . Smokeless tobacco: Not on file   Comment: 1 ppd  . Alcohol Use: No  . Drug Use: No  . Sexually Active: Not on file   Other Topics Concern  . Not on file   Social History Narrative  . No narrative on file    Review of Systems: ROS Dr. Jomarie Longs dated 10/21/11 reviewed, and I agree Physical Exam: Vital signs in last 24 hours: Temp:  [97.5 F (36.4 C)-99.3 F (37.4 C)] 98.7 F (37.1 C) (09/11 1325) Pulse Rate:  [71-86] 71  (09/11 1325) Resp:  [11-20] 20  (09/11 1325) BP: (73-130)/(19-53) 97/45 mmHg (09/11 1325) SpO2:  [96 %-100 %] 100 % (09/11 1225) Weight:  [38.7 kg (85 lb 5.1 oz)] 38.7 kg (85 lb 5.1 oz) (09/11 1225)   General:   Alert,  Thin, somewhat chronically cachectic-appearing,pleasant and cooperative in NAD Head:  Normocephalic and atraumatic. Eyes:  Sclera clear, no icterus.   Conjunctiva pink. Ears:  Normal auditory acuity. Nose:  No deformity, discharge,  or lesions. Mouth:  No deformity or lesions.  Oropharynx pink & moist. Neck:  Supple; no masses or thyromegaly. Lungs:  Clear throughout to auscultation.   No wheezes, crackles, or rhonchi. No acute distress. Heart:  Regular rate and rhythm; no murmurs, clicks, rubs,  or gallops. Abdomen:  Soft, nontender and nondistended. No masses, hepatosplenomegaly or hernias noted. Normal bowel  sounds, without guarding, and without rebound.     Msk:  Osteoarthritis changes in hands, Symmetrical without gross deformities. Normal posture. Pulses:  Normal pulses noted. Extremities:  Without clubbing or edema. Neurologic:  Alert and  oriented x4;  Diffusely weak, otherwise grossly normal neurologically. Skin:  Occasional ecchymoses, otherwise intact without significant lesions or rashes. Psych:  Alert and cooperative. Normal mood and affect.   Lab Results:  [facility] 10/21/11 0809  WBC 5.5  HGB 4.9*  HCT 15.3*  PLT 202   BMET  Basename 10/21/11 0809  NA 140  K 3.6  CL 109  CO2 23  GLUCOSE 90  BUN 38*  CREATININE 0.55  CALCIUM 7.1*   LFT  Basename 10/21/11 0809  PROT 4.4*  ALBUMIN 2.6*  AST 9  ALT 9  ALKPHOS 50  BILITOT <0.1*  BILIDIR --  IBILI --   PT/INR No results found for this basename: LABPROT:2,INR:2 in the last 72 hours  Studies/Results: No results found.  Impression:  1.  Melena.  Suspect peptic ulcer. 2.  Acute blood loss anemia.  Plan:  1.  Protonix bid therapy. 2.  Avoid ASA/NSAIDs as clinically feasible. 3.  Volume repletion, transfusion PRBCs, as you are doing. 4.  Clear liquids today, NPO after midnight. 5.  Upper endoscopy tomorrow.   LOS: 0 days   Pervis Macintyre M  10/21/2011, 2:44 PM

## 2011-10-21 NOTE — H&P (Signed)
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 She also has severe osteoarthritis and has been on prednisone a week ago.  She comes to the ED with epigastric pain radiating to her back for 2 days, and noticed black stools yesterday about 3-4 times. Denies any NSAID use, stopped taking her nexium few weeks ago, as she attributed some reaction in her throat to this, despite being on this for 6-7 years. In addition started feeling very weak with dizziness Upon eval in ER noted to have a hemoglobin of 4.9, rectal exam with guaic pos, brown stool   Allergies:   Allergies  Allergen Reactions  . Doxycycline Itching  . Levofloxacin     REACTION: GI upset, mouth/throat sores  . Nexium (Esomeprazole Magnesium) Swelling    Closes up my throat  . Penicillins     REACTION: itch      Past Medical History  Diagnosis Date  . Tobacco use disorder   . Chronic rhinitis   . Acute bronchitis   . GERD (gastroesophageal reflux disease)   . Thyroid disease     Past Surgical History  Procedure Date  . Total abdominal hysterectomy   . Gastric outlet obstruction release     7 years ago  . Thyroidectomy 2012    Prior to Admission medications   Medication Sig Start Date End Date Taking? Authorizing Provider  fluticasone (FLONASE) 50 MCG/ACT nasal spray 1-2 puffs each nostril once daily at bedtime 09/30/11 09/29/12 Yes Clinton D Young, MD  HYDROcodone-acetaminophen (VICODIN) 5-500 MG per tablet Take 1 tablet by mouth every 6 (six) hours as needed. Osteoarthritis pain Takes with Tramadol 01/06/11  Yes Historical Provider, MD  levothyroxine (SYNTHROID, LEVOTHROID) 112 MCG tablet  01/05/11  Yes Historical Provider, MD  Multiple Vitamin  (MULTIVITAMIN) capsule Take 1 capsule by mouth daily.     Yes Historical Provider, MD  traMADol (ULTRAM) 50 MG tablet Take 50 mg by mouth every 6 (six) hours as needed. Osteoarthritis pain Takes with Vicodin   Yes Historical Provider, MD  fexofenadine (ALLEGRA) 180 MG tablet Take 180 mg by mouth daily as needed. Seasonal allergies    Historical Provider, MD  pseudoephedrine (SUDAFED) 30 MG tablet Take 30 mg by mouth every 4 (four) hours as needed. Seasonal allergies    Historical Provider, MD    Social History:  reports that she has been smoking Cigarettes.  She has been smoking about .5 packs per day. She does not have any smokeless tobacco history on file. She reports that she does not drink alcohol or use illicit drugs.  Family History  Problem Relation Age of Onset  . Cancer Father   . Cirrhosis Mother     Review of Systems:  Constitutional: Denies fever, chills, diaphoresis, appetite change and fatigue.  HEENT: Denies photophobia, eye pain, redness, hearing loss, ear pain, congestion, sore throat, rhinorrhea, sneezing, mouth sores, trouble swallowing, neck pain, neck stiffness and tinnitus.   Respiratory: Denies SOB, DOE, cough, chest tightness,  and wheezing.   Cardiovascular: Denies chest pain, palpitations and leg swelling.  Gastrointestinal: Denies nausea, vomiting, abdominal pain, diarrhea, constipation, blood in stool and abdominal distention.  Genitourinary: Denies dysuria, urgency, frequency, hematuria, flank pain and difficulty urinating.  Musculoskeletal: Denies myalgias, back pain, joint swelling, arthralgias and gait problem.  Skin: Denies pallor, rash and wound.  Neurological: Denies dizziness, seizures, syncope, weakness, light-headedness, numbness and headaches.  Hematological: Denies adenopathy. Easy bruising, personal or family bleeding history  Psychiatric/Behavioral: Denies suicidal ideation, mood changes, confusion, nervousness, sleep disturbance and  agitation   Physical Exam: Blood pressure 96/45, pulse 79, temperature 99.3 F (37.4 C), temperature source Oral, resp. rate 20, height 5' 2.5" (1.588 m), weight 38.7 kg (85 lb 5.1 oz), SpO2 100.00%. Gen: AAOx3, no distress, very pale appearing HEENT: pallor noted, no JVD CVS: S1S2/RRR, no m/r/g Lungs: CTAB ABd: soft, NT, Bs present, no organomegaly Ext: no edema c/c, joint deformities in hands suggestive of chronic OA Neuro: moves all ext, no localising signs Skin: no rashes or skin breakdown  Labs on Admission:  Results for orders placed during the hospital encounter of 10/21/11 (from the past 48 hour(s))  COMPREHENSIVE METABOLIC PANEL     Status: Abnormal   Collection Time   10/21/11  8:09 AM      Component Value Range Comment   Sodium 140  135 - 145 mEq/L    Potassium 3.6  3.5 - 5.1 mEq/L    Chloride 109  96 - 112 mEq/L    CO2 23  19 - 32 mEq/L    Glucose, Bld 90  70 - 99 mg/dL    BUN 38 (*) 6 - 23 mg/dL    Creatinine, Ser 1.61  0.50 - 1.10 mg/dL    Calcium 7.1 (*) 8.4 - 10.5 mg/dL    Total Protein 4.4 (*) 6.0 - 8.3 g/dL    Albumin 2.6 (*) 3.5 - 5.2 g/dL    AST 9  0 - 37 U/L    ALT 9  0 - 35 U/L    Alkaline Phosphatase 50  39 - 117 U/L    Total Bilirubin <0.1 (*) 0.3 - 1.2 mg/dL REPEATED TO VERIFY   GFR calc non Af Amer >90  >90 mL/min    GFR calc Af Amer >90  >90 mL/min   CBC WITH DIFFERENTIAL     Status: Abnormal   Collection Time   10/21/11  8:09 AM      Component Value Range Comment   WBC 5.5  4.0 - 10.5 K/uL    RBC 1.59 (*) 3.87 - 5.11 MIL/uL    Hemoglobin 4.9 (*) 12.0 - 15.0 g/dL    HCT 09.6 (*) 04.5 - 46.0 %    MCV 96.2  78.0 - 100.0 fL    MCH 30.8  26.0 - 34.0 pg    MCHC 32.0  30.0 - 36.0 g/dL    RDW 40.9  81.1 - 91.4 %    Platelets 202  150 - 400 K/uL    Neutrophils Relative 80 (*) 43 - 77 %    Lymphocytes Relative 11 (*) 12 - 46 %    Monocytes Relative 8  3 - 12 %    Eosinophils Relative 1  0 - 5 %    Basophils Relative 0  0 - 1 %    Neutro Abs 4.4   1.7 - 7.7 K/uL    Lymphs Abs 0.6 (*) 0.7 - 4.0 K/uL    Monocytes Absolute 0.4  0.1 - 1.0 K/uL    Eosinophils Absolute 0.1  0.0 - 0.7 K/uL    Basophils Absolute 0.0  0.0 - 0.1 K/uL    RBC Morphology POLYCHROMASIA PRESENT     LIPASE, BLOOD  Status: Normal   Collection Time   10/21/11  8:09 AM      Component Value Range Comment   Lipase 15  11 - 59 U/L   OCCULT BLOOD, POC DEVICE     Status: Normal   Collection Time   10/21/11  8:09 AM      Component Value Range Comment   Fecal Occult Bld POSITIVE     PREPARE RBC (CROSSMATCH)     Status: Normal   Collection Time   10/21/11  9:51 AM      Component Value Range Comment   Order Confirmation ORDER PROCESSED BY BLOOD BANK     TYPE AND SCREEN     Status: Normal (Preliminary result)   Collection Time   10/21/11  9:51 AM      Component Value Range Comment   ABO/RH(D) O NEG      Antibody Screen NEG      Sample Expiration 10/24/2011      Unit Number [id number]      Blood Component Type RED CELLS,LR      Unit division 00      Status of Unit ALLOCATED      Transfusion Status OK TO TRANSFUSE      Crossmatch Result Compatible      Unit Number [id number]      Blood Component Type RBC CPDA1, LR      Unit division 00      Status of Unit ISSUED      Transfusion Status OK TO TRANSFUSE      Crossmatch Result Compatible      Unit Number [id number]      Blood Component Type RED CELLS,LR      Unit division 00      Status of Unit ALLOCATED      Transfusion Status OK TO TRANSFUSE      Crossmatch Result Compatible      Unit Number [id number]      Blood Component Type RED CELLS,LR      Unit division 00      Status of Unit ALLOCATED      Transfusion Status OK TO TRANSFUSE      Crossmatch Result Compatible     ABO/RH     Status: Normal   Collection Time   10/21/11  9:51 AM      Component Value Range Comment   ABO/RH(D) O NEG       Radiological Exams on Admission: No results found.  Assessment/Plan 1. Upper GI bleed 2. Acute  blood loss Anemia 3. 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 4. Osteoarthritis Plan: Admit to tele bed, as hemodynamically stable and no further bleeding today this far Transfuse 3 Units PRBC today IVF CBC Q8 Protonix IV Q12 [facility] consulted for EGD Start clears today Supportive care, anti-emetics, pain control Lifelong PPI    Time Spent on Admission: Jacklyne Baik [facility] Pager: [phone] 10/21/2011, 1:22 PM

## 2011-10-22 ENCOUNTER — Encounter (HOSPITAL_COMMUNITY)
Admission: EM | Disposition: A | Discharge status: Home / Self Care | Payer: Self-pay | Source: Home / Self Care | Attending: Internal Medicine

## 2011-10-22 ENCOUNTER — Encounter (HOSPITAL_COMMUNITY): Payer: Self-pay | Admitting: Gastroenterology

## 2011-10-22 HISTORY — PX: ESOPHAGOGASTRODUODENOSCOPY: SHX5428

## 2011-10-22 LAB — CBC
HCT: 35.5 % — ABNORMAL LOW (ref 36.0–46.0)
Hemoglobin: 10.6 g/dL — ABNORMAL LOW (ref 12.0–15.0)
Hemoglobin: 12 g/dL (ref 12.0–15.0)
MCH: 30.4 pg (ref 26.0–34.0)
Platelets: 177 10*3/uL (ref 150–400)
RBC: 3.49 MIL/uL — ABNORMAL LOW (ref 3.87–5.11)
RBC: 3.91 MIL/uL (ref 3.87–5.11)
WBC: 3.9 10*3/uL — ABNORMAL LOW (ref 4.0–10.5)
WBC: 4.1 10*3/uL (ref 4.0–10.5)

## 2011-10-22 LAB — BASIC METABOLIC PANEL
CO2: 24 mEq/L (ref 19–32)
Calcium: 7.4 mg/dL — ABNORMAL LOW (ref 8.4–10.5)
Potassium: 3.9 mEq/L (ref 3.5–5.1)
Sodium: 141 mEq/L (ref 135–145)

## 2011-10-22 SURGERY — EGD (ESOPHAGOGASTRODUODENOSCOPY)
Anesthesia: Moderate Sedation | Laterality: Left

## 2011-10-22 MED ORDER — MIDAZOLAM HCL 5 MG/ML IJ SOLN
INTRAMUSCULAR | Status: AC
  Filled 2011-10-22: qty 2

## 2011-10-22 MED ORDER — FENTANYL CITRATE 0.05 MG/ML IJ SOLN
INTRAMUSCULAR | Status: AC
  Filled 2011-10-22: qty 2

## 2011-10-22 MED ORDER — SUCRALFATE 1 GM/10ML PO SUSP: 1.0000 g | Freq: Three times a day (TID) | ORAL | Status: DC

## 2011-10-22 MED ORDER — FENTANYL CITRATE 0.05 MG/ML IJ SOLN
INTRAMUSCULAR | Status: DC | PRN
  Administered 2011-10-22: 25 ug via INTRAVENOUS

## 2011-10-22 MED ORDER — MIDAZOLAM HCL 10 MG/2ML IJ SOLN
INTRAMUSCULAR | Status: DC | PRN
  Administered 2011-10-22: 1 mg via INTRAVENOUS
  Administered 2011-10-22 (×2): 2 mg via INTRAVENOUS

## 2011-10-22 MED ORDER — BUTAMBEN-TETRACAINE-BENZOCAINE 2-2-14 % EX AERO
INHALATION_SPRAY | CUTANEOUS | Status: DC | PRN
  Administered 2011-10-22: 2 via TOPICAL

## 2011-10-22 MED ORDER — DIPHENHYDRAMINE HCL 50 MG/ML IJ SOLN
INTRAMUSCULAR | Status: AC
  Filled 2011-10-22: qty 1

## 2011-10-22 MED ORDER — SUCRALFATE 1 GM/10ML PO SUSP
1.0000 g | Freq: Three times a day (TID) | ORAL | Status: DC
  Administered 2011-10-22 – 2011-10-23 (×4): 1 g via ORAL
  Filled 2011-10-22 (×9): qty 10

## 2011-10-22 MED ORDER — PANTOPRAZOLE SODIUM 40 MG PO TBEC
40.0000 mg | DELAYED_RELEASE_TABLET | Freq: Two times a day (BID) | ORAL | Status: DC
  Administered 2011-10-22 – 2011-10-23 (×2): 40 mg via ORAL
  Filled 2011-10-22 (×2): qty 1

## 2011-10-22 MED ORDER — PANTOPRAZOLE SODIUM 40 MG PO TBEC: 40.0000 mg | DELAYED_RELEASE_TABLET | Freq: Two times a day (BID) | ORAL | Status: DC

## 2011-10-22 NOTE — Op Note (Signed)
[facility] [address] [place], [address]   ENDOSCOPY PROCEDURE REPORT  PATIENT: [name], [name]  MR#: [id number] BIRTHDATE: Mar 04, 1945 , 66  yrs. old GENDER: Female ENDOSCOPIST: Willis Modena, MD REFERRED BY:  Zannie Cove, M.D. PROCEDURE DATE:  10/22/2011 PROCEDURE:  EGD, diagnostic ASA CLASS:     Class II INDICATIONS:  acute post hemorrhagic anemia.   melena. MEDICATIONS: Fentanyl 25 mcg IV and Versed 5 mg IV TOPICAL ANESTHETIC: Cetacaine Spray  DESCRIPTION OF PROCEDURE: After the risks benefits and alternatives of the procedure were thoroughly explained, informed consent was obtained.  The    endoscope was introduced through the mouth and advanced to the proximal 10 cm of the efferent jejunostomy. . Without limitations.  The instrument was slowly withdrawn as the mucosa was fully examined.     Findings: Normal esophagus.  Bilroth-II gastric anatomy appreciated.  Large serpiginous deep but clean-based ulcer at gastrojejunostomy anastomosis.  No visible vessel, adherent clot or active bleeding. Proximal 10cm of efferent limb was normal.  Extensive bile gastritis.  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        Retroflexed view of cardia normal.  No old or fresh blood was seen to the extent of our examination.         The scope was then withdrawn from the patient and the procedure completed.  ENDOSCOPIC IMPRESSION:     As above.  Anastomotic ulcer highly likely source  of patient's anemia and melena. Ulcer etiology probably function of both anastomotic ischemia and NSAID use.   RECOMMENDATIONS:     1.  Watch for potential complications of procedure. 2.  NO NSAIDs.  Will check H. pylori serologies, but doubt this is playing role in her current situation. 3.  Protonix 40 mg bid x 8-12 weeks. 4.  Carafate suspension, 1 g po qid for 4 weeks. 5.  Advance  diet. 6.  If no further bleeding and stable hgb, hopefully able to discharge tomorrow. 7.  Will follow.  eSigned:  Willis Modena, MD 10/22/2011 9:17 AM   CC:

## 2011-10-22 NOTE — Interval H&P Note (Signed)
History and Physical Interval Note:  10/22/2011 8:36 AM  [name]  has presented today for surgery, with the diagnosis of blood in stool, anemia  The various methods of treatment have been discussed with the patient and family. After consideration of risks, benefits and other options for treatment, the patient has consented to  Procedure(s) (LRB) with comments: ESOPHAGOGASTRODUODENOSCOPY (EGD) (Left) as a surgical intervention .  The patient's history has been reviewed, patient examined, no change in status, stable for surgery.  I have reviewed the patient's chart and labs.  Questions were answered to the patient's satisfaction.     [name]  Assessment:  1.  Anemia + Melena.  History gastric surgery, history peptic ulcers.  + NSAIDs.  Plan:  1.  Endoscopy with possible endoscopic therapy. 2.  Risks (bleeding, infection, bowel perforation that could require surgery, sedation-related changes in cardiopulmonary systems), benefits (identification and possible treatment of source of symptoms, exclusion of certain causes of symptoms), and alternatives (watchful waiting, radiographic imaging studies, empiric medical treatment) of upper endoscopy (EGD) were explained to patient in detail and patient wishes to proceed.

## 2011-10-22 NOTE — Progress Notes (Signed)
Subjective: Pt feel better S/p 3 unit- HGB- 10.9 EGD today  Objective: Vital signs in last 24 hours: Temp:  [98 F (36.7 C)-99.3 F (37.4 C)] 98.2 F (36.8 C) (09/12 0739) Pulse Rate:  [56-86] 64  (09/12 0739) Resp:  [14-20] 14  (09/12 0739) BP: (73-141)/(19-69) 129/67 mmHg (09/12 0739) SpO2:  [96 %-100 %] 98 % (09/12 0739) Weight:  [38.7 kg (85 lb 5.1 oz)-38.783 kg (85 lb 8 oz)] 38.783 kg (85 lb 8 oz) (09/11 2005) Weight change:  Last BM Date: 10/20/11  Intake/Output from previous day: 09/11 0701 - 09/12 0700 In: 1888.5 [I.V.:1518.5; Blood:350; IV Piggyback:20] Out: 2500 [Urine:2500] Intake/Output this shift:    General appearance: alert Resp: clear to auscultation bilaterally GI: soft, non-tender; bowel sounds normal; no masses,  no organomegaly  Lab Results:  Basename 10/22/11 0320 10/21/11 1853  WBC 3.9* 5.0  HGB 10.6* 9.6*  HCT 31.2* 28.3*  PLT 177 203   BMET  Basename 10/22/11 0320 10/21/11 0809  NA 141 140  K 3.9 3.6  CL 109 109  CO2 24 23  GLUCOSE 90 90  BUN 15 38*  CREATININE 0.52 0.55  CALCIUM 7.4* 7.1*    Studies/Results: No results found.  Medications: I have reviewed the patient's current medications.  Assessment/Plan: Severe Anemia/ GI bleed- upper- EGD today H/o PUD in past/ stop Nexium 3 week ago - ? Swelling sensation protonic ok po Continue monitor HBG PPI Hypothyroid- on synthroid Cbc in am- if stable ok for d/c in am OA - vicodin prn   LOS: 1 day   [name] 10/22/2011, 8:07 AM

## 2011-10-23 ENCOUNTER — Encounter (HOSPITAL_COMMUNITY): Payer: Self-pay

## 2011-10-23 ENCOUNTER — Encounter (HOSPITAL_COMMUNITY): Payer: Self-pay | Admitting: Gastroenterology

## 2011-10-23 LAB — CBC
HCT: 34.7 % — ABNORMAL LOW (ref 36.0–46.0)
Hemoglobin: 11.4 g/dL — ABNORMAL LOW (ref 12.0–15.0)
MCH: 30.2 pg (ref 26.0–34.0)
MCV: 92 fL (ref 78.0–100.0)
RBC: 3.77 MIL/uL — ABNORMAL LOW (ref 3.87–5.11)

## 2011-10-23 MED ORDER — PANTOPRAZOLE SODIUM 40 MG PO TBEC: 40.0000 mg | DELAYED_RELEASE_TABLET | Freq: Two times a day (BID) | ORAL | Status: DC

## 2011-10-23 MED ORDER — SUCRALFATE 1 GM/10ML PO SUSP: 1.0000 g | Freq: Three times a day (TID) | ORAL | Status: DC

## 2011-10-23 NOTE — Progress Notes (Signed)
Pt. discharged to home. Pt after visit summary reviewed and pt capable of re verbalizing medications and follow up appointments. Pt remains stable. No signs and symptoms of distress. Educated to return to ER in the event of SOB, dizziness, chest pain, or fainting. Raechell Singleton, RN  

## 2011-10-23 NOTE — Progress Notes (Signed)
Subjective: No more blood in stool. Tolerating diet.   Wants to go home.  Objective: Vital signs in last 24 hours: Temp:  [97.4 F (36.3 C)-98.5 F (36.9 C)] 98.3 F (36.8 C) (09/13 0446) Pulse Rate:  [61-76] 61  (09/13 0446) Resp:  [18-20] 20  (09/13 0446) BP: (112-134)/(56-64) 130/64 mmHg (09/13 0446) SpO2:  [95 %-100 %] 95 % (09/13 0446) Weight:  [38.783 kg (85 lb 8 oz)] 38.783 kg (85 lb 8 oz) (09/12 2103) Weight change: 0.083 kg (2.9 oz) Last BM Date: 10/20/11  PE: GEN:  NAD SKIN:  Much less pale  Lab Results: CBC    Component Value Date/Time   WBC 4.1 10/23/2011 0600   RBC 3.77* 10/23/2011 0600   HGB 11.4* 10/23/2011 0600   HCT 34.7* 10/23/2011 0600   PLT 205 10/23/2011 0600   MCV 92.0 10/23/2011 0600   MCH 30.2 10/23/2011 0600   MCHC 32.9 10/23/2011 0600   RDW 16.1* 10/23/2011 0600   LYMPHSABS 0.6* 10/21/2011 0809   MONOABS 0.4 10/21/2011 0809   EOSABS 0.1 10/21/2011 0809   BASOSABS 0.0 10/21/2011 0809   Assessment:  1.  Melena with acute blood loss anemia.  Resolved. 2.  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  Plan:  1.  Protonix 40 mg bid x 8-12 weeks. 2.  Carafate suspension 1 gram QID x 4 weeks. 3.  No NSAIDs. 4.  OK to discharge from GI perspective.  Can follow-up with Dr. Danise Edge, her primary gastroenterologist. 5.  Will sign-off; please call with questions; thank you for the consult.   [name] 10/23/2011, 10:17 AM

## 2011-10-23 NOTE — Discharge Summary (Signed)
Physician Discharge Summary  Patient ID: [name] MRN: [id number] DOB/AGE: [birth year] 66 y.o.  Admit date: 10/21/2011 Discharge date: 10/23/2011  Admission Diagnoses:  Discharge Diagnoses:  Active Problems:  Upper GI bleed  Anemia   Discharged Condition: good  Hospital Course: female, 66 years old with history of chronic pain with osteoarthritis, history of peptic ulcer disease, history of Billroth II procedure, admitted with severe anemia weakness low blood pressure, found to have hemoglobin of 4 GI: patient had upper GI bleed with melena, receive 3 units of packed red blood cells; IV fluids; blood pressure improved Hemoglobin improved to 12; at the time of discharge 11.4. No more further bleeding. GI consulted she had upper endoscopy done which revealed an ulcer at the anastomosis site without active bleeding at that time. Probable source of the bleeding; patient had been taking some aspirin as for the headache also was offered PPI Patient will continue on Protonix twice a day for 6-8 weeks, Carafate suspension 4 times a day for one month GI followup Avoid NSAIDs Chronic pain syndrome/osteoarthritis: Vicodin and tramadol; avoid NSAIDs and aspirin products Hypothyroid: continue Synthroid Patient discharged home with follow up next week  Consults: GI  Significant Diagnostic Studies: labs: hemoglobin admission 4.6, at the time of discharge 11.4 blood chemistries thyroid normal, chest x-ray normal and endoscopy: gastroscopy: ulcer at the anastomosis site; no obstruction, Billroth II anastomosis noted; see details GI note. Treatments: IV hydration  Discharge Exam: Blood pressure 130/64, pulse 61, temperature 98.3 F (36.8 C), temperature source Oral, resp. rate 20, height 5' 2.5" (1.588 m), weight 38.783 kg (85 lb 8 oz), SpO2 95.00%. General appearance: alert Resp: clear to auscultation bilaterally Cardio: regular rate and rhythm GI: soft, non-tender; bowel sounds normal; no  masses,  no organomegaly  Disposition: 01-Home or Self Care  Discharge Orders    Future Appointments: Provider: Department: Dept Phone: Center:   03/31/2012 11:00 AM Waymon Budge, MD [facility] [phone] None     Future Orders Please Complete By Expires   Diet - low sodium heart healthy      Increase activity slowly          Medication List     As of 10/23/2011  8:38 AM    STOP taking these medications         pseudoephedrine 30 MG tablet   Commonly known as: SUDAFED      TAKE these medications         fexofenadine 180 MG tablet   Commonly known as: ALLEGRA   Take 180 mg by mouth daily as needed. Seasonal allergies      fluticasone 50 MCG/ACT nasal spray   Commonly known as: FLONASE   1-2 puffs each nostril once daily at bedtime      HYDROcodone-acetaminophen 5-500 MG per tablet   Commonly known as: VICODIN   Take 1 tablet by mouth every 6 (six) hours as needed. Osteoarthritis pain  Takes with Tramadol      levothyroxine 112 MCG tablet   Commonly known as: SYNTHROID, LEVOTHROID      multivitamin capsule   Take 1 capsule by mouth daily.      pantoprazole 40 MG tablet   Commonly known as: PROTONIX   Take 1 tablet (40 mg total) by mouth 2 (two) times daily before a meal.      sucralfate 1 GM/10ML suspension   Commonly known as: CARAFATE   Take 10 mLs (1 g total) by mouth 4 (four) times daily -  before meals and at bedtime.      traMADol 50 MG tablet   Commonly known as: ULTRAM   Take 50 mg by mouth every 6 (six) hours as needed. Osteoarthritis pain  Takes with Vicodin           Follow-up Information    Follow up with Georgann Housekeeper, MD. Schedule an appointment as soon as possible for a visit in 1 week.   Contact information:   [address] [place] [address] [phone]          Signed: Georgann Housekeeper 10/23/2011, 8:38 AM

## 2011-10-25 LAB — TYPE AND SCREEN
Antibody Screen: NEGATIVE
Unit division: 0
Unit division: 0

## 2012-01-20 ENCOUNTER — Telehealth: Payer: Self-pay | Admitting: Internal Medicine

## 2012-01-20 MED ORDER — AZITHROMYCIN 250 MG PO TABS: ORAL_TABLET | ORAL | Status: DC

## 2012-01-20 MED ORDER — FLUTICASONE PROPIONATE 50 MCG/ACT NA SUSP: NASAL | Status: DC

## 2012-01-20 NOTE — Telephone Encounter (Signed)
Per CY okay to give Zpak #1 take as directed no refills. 

## 2012-01-20 NOTE — Telephone Encounter (Signed)
Pt c/o prod cough(clear - yellow) for 1 week, sorethroat, sinus drainage (clear - yellow), chest congestion, low grade temp.  Denies sob.  Pt requesting a "mild" abx.  Please advise.   Last ov:  09-30-11    Next ov:  03-31-12 Allergies  Allergen Reactions  . Doxycycline Itching  . Levofloxacin     REACTION: GI upset, mouth/throat sores  . Penicillins     REACTION: itch

## 2012-01-20 NOTE — Telephone Encounter (Signed)
Pt notified that rx for Zpak was sent to pharmacy along with refill on Flonase.

## 2012-03-31 ENCOUNTER — Ambulatory Visit (INDEPENDENT_AMBULATORY_CARE_PROVIDER_SITE_OTHER): Payer: Medicare Other | Admitting: Internal Medicine

## 2012-03-31 ENCOUNTER — Encounter: Payer: Self-pay | Admitting: Internal Medicine

## 2012-03-31 VITALS — BP 128/78 | HR 79 | Ht 62.0 in | Wt 92.4 lb

## 2012-03-31 DIAGNOSIS — J209 Acute bronchitis, unspecified: Secondary | ICD-10-CM

## 2012-03-31 DIAGNOSIS — F17200 Nicotine dependence, unspecified, uncomplicated: Secondary | ICD-10-CM

## 2012-03-31 DIAGNOSIS — J310 Chronic rhinitis: Secondary | ICD-10-CM

## 2012-03-31 NOTE — Progress Notes (Signed)
Patient ID: [name], female    DOB: 09-21-1945, 67 y.o.   MRN: [id number]  HPI 09/30/10- 1 yoF smoker followed for allergic rhinitis and recurrent bronchitis. 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  She denies respiratory concerns through the long spring pollen season or summer heat. Still denies morning cough, phlegm or wheeze.  Dr Donette Larry PCP gets annual CXR  No recent PFT Pneumovax at least once  02/13/11- 65 yoF smoker followed for allergic rhinitis and recurrent bronchitis. Has had a recent sore throat, green discharge from nose and chest and low-grade fever. She is now taking a Z-Pak and feels better but asks Depo-Medrol. Her daughter is using a Neti pot and we discussed saline lavage. Since last here had thyroidectomy in March, uncomplicated. PFT 10/14/10-reviewed with her, showing mild/ moderate obstructive airways disease with insignificant response to bronchodilator, hyperinflation and air trapping, normal diffusion. FEV1/FVC 0.65. I used this as a basis for talking again with her about smoking cessation today.  09/30/11- 65 yoF smoker followed for allergic rhinitis and recurrent bronchitis. Last week had bout of colored phelgm-green and thick in color, felt like fever but no temps when checked. Took prednisone medrol pak and this helped with symptoms. Back to baseline now but noting some vertex/tension headache, nasal congestion. Asks refill fluticasone. Still smoking. We discussed this again. She is not motivated to stop despite my efforts.  03/31/12- 67 yoF smoker followed for allergic rhinitis and recurrent bronchitis. FOLLOWS FOR: denies any SOB, wheezing, cough, or congestion She was hospitalized for upper GI bleed and transfused. Stabilized now and feeling much stronger. CXR 10/09/11 IMPRESSION:  COPD. No active disease.  Original Report Authenticated By: Cyndie Chime, M.D.  Review of Systems- see  HPI Constitutional:   No-   weight loss, night sweats, fevers, chills, fatigue, lassitude. HEENT:   +  headaches, no-difficulty swallowing, tooth/dental problems, sore throat,       No-  sneezing, itching, ear ache,  +nasal congestion, post nasal drip,  CV:  No-   chest pain, orthopnea, PND, swelling in lower extremities, anasarca, dizziness, palpitations Resp: little acute  shortness of breath with exertion or at rest.              No- productive cough,  No non-productive cough,  No-  coughing up of blood.              + change in color of mucus.  No- wheezing.   Skin: No-   rash or lesions. GI:  No-   heartburn, indigestion, abdominal pain, nausea, vomiting, GU:  MS:  No-   joint pain or swelling.   Neuro- nothing unusual Psych:  No- change in mood or affect. No depression or anxiety.  No memory loss.  Objective:   Physical Exam General- Alert, Oriented, Affect-appropriate, Distress- none acute    thin Skin- rash-none, lesions- none, excoriation- none. Normally pale complexion Lymphadenopathy- none Head- atraumatic            Eyes- Gross vision intact, PERRLA, conjunctivae clear secretions    Periorbital edema            Ears- Hearing, canals normal            Nose- , no-Septal dev, mucus, polyps, erosion, perforation             Throat- Mallampati II , mucosa clear , drainage- none, tonsils- atrophic Neck- flexible , trachea midline,  no stridor , thyroid nl, carotid no bruit Chest - symmetrical excursion , unlabored           Heart/CV- RRR , no murmur , no gallop  , no rub, nl s1 s2                           - JVD- none , edema- none, stasis changes- none, varices- none           Lung- decreased, wheeze- none, cough- none , dullness-none, rub- none    Very clear           Chest wall-  Abd- Br/ Gen/ Rectal- Not done, not indicated Extrem- cyanosis- none, clubbing, none, atrophy- none, strength- nl Neuro- grossly intact to observation

## 2012-03-31 NOTE — Patient Instructions (Addendum)
Please call as needed 

## 2012-04-01 NOTE — Assessment & Plan Note (Signed)
Acute bronchitis responded to antibiotics called in in December

## 2012-04-01 NOTE — Assessment & Plan Note (Signed)
Rhinitis is well controlled. We will be watching onset of spring pollen season as an additional irritant.

## 2012-04-01 NOTE — Assessment & Plan Note (Signed)
I continue to try encouragement and offers of support for smoking cessation and

## 2012-09-29 ENCOUNTER — Ambulatory Visit: Payer: Medicare Other | Admitting: Internal Medicine

## 2012-10-13 ENCOUNTER — Ambulatory Visit (INDEPENDENT_AMBULATORY_CARE_PROVIDER_SITE_OTHER)
Admission: RE | Admit: 2012-10-13 | Discharge: 2012-10-13 | Disposition: A | Discharge status: Home / Self Care | Payer: Medicare Other | Source: Ambulatory Visit | Attending: Internal Medicine | Admitting: Internal Medicine

## 2012-10-13 ENCOUNTER — Ambulatory Visit (INDEPENDENT_AMBULATORY_CARE_PROVIDER_SITE_OTHER): Payer: Medicare Other | Admitting: Internal Medicine

## 2012-10-13 ENCOUNTER — Encounter: Payer: Self-pay | Admitting: Internal Medicine

## 2012-10-13 VITALS — BP 130/82 | HR 73 | Ht 62.0 in | Wt 97.6 lb

## 2012-10-13 DIAGNOSIS — J438 Other emphysema: Secondary | ICD-10-CM

## 2012-10-13 DIAGNOSIS — J449 Chronic obstructive pulmonary disease, unspecified: Secondary | ICD-10-CM

## 2012-10-13 DIAGNOSIS — J439 Emphysema, unspecified: Secondary | ICD-10-CM

## 2012-10-13 DIAGNOSIS — J310 Chronic rhinitis: Secondary | ICD-10-CM

## 2012-10-13 DIAGNOSIS — F17200 Nicotine dependence, unspecified, uncomplicated: Secondary | ICD-10-CM

## 2012-10-13 NOTE — Patient Instructions (Addendum)
Order- CXR   Dx COPD  Please keep trying to stop smoking  Don't forget to get that flu shot !

## 2012-10-13 NOTE — Progress Notes (Signed)
Patient ID: [name], female    DOB: Jan 15, 1946, 67 y.o.   MRN: [id number]  HPI 09/30/10- 67 yoF smoker followed for allergic rhinitis and recurrent bronchitis. 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  She denies respiratory concerns through the long spring pollen season or summer heat. Still denies morning cough, phlegm or wheeze.  Dr Donette Larry PCP gets annual CXR  No recent PFT Pneumovax at least once  02/13/11- 67 yoF smoker followed for allergic rhinitis and recurrent bronchitis. Has had a recent sore throat, green discharge from nose and chest and low-grade fever. She is now taking a Z-Pak and feels better but asks Depo-Medrol. Her daughter is using a Neti pot and we discussed saline lavage. Since last here had thyroidectomy in March, uncomplicated. PFT 10/14/10-reviewed with her, showing mild/ moderate obstructive airways disease with insignificant response to bronchodilator, hyperinflation and air trapping, normal diffusion. FEV1/FVC 0.65. I used this as a basis for talking again with her about smoking cessation today.  09/30/11- 67 yoF smoker followed for allergic rhinitis and recurrent bronchitis. Last week had bout of colored phelgm-green and thick in color, felt like fever but no temps when checked. Took prednisone medrol pak and this helped with symptoms. Back to baseline now but noting some vertex/tension headache, nasal congestion. Asks refill fluticasone. Still smoking. We discussed this again. She is not motivated to stop despite my efforts.  03/31/12- 67 yoF smoker followed for allergic rhinitis and recurrent bronchitis. FOLLOWS FOR: denies any SOB, wheezing, cough, or congestion She was hospitalized for upper GI bleed and transfused. Stabilized now and feeling much stronger. CXR 10/09/11 IMPRESSION:  COPD. No active disease.  Original Report Authenticated By: Cyndie Chime, M.D.  10/13/12- 67 yoF smoker followed  for allergic rhinitis and recurrent bronchitis. FOLLOWS FOR: Breathing is unchanged since last OV.  No complaints today Minor seasonal watery rhinorrhea without much sneezing. She feels well controlled. No change in dyspnea with exertion. No routine cough or phlegm. She is not making an effort to stop smoking despite discussion  Review of Systems- see HPI Constitutional:   No-   weight loss, night sweats, fevers, chills, fatigue, lassitude. HEENT:   +  headaches, no-difficulty swallowing, tooth/dental problems, sore throat,       No-  sneezing, itching, ear ache,  +nasal congestion, +post nasal drip,  CV:  No-   chest pain, orthopnea, PND, swelling in lower extremities, anasarca, dizziness, palpitations Resp: little acute  shortness of breath with exertion or at rest.              No- productive cough,  No non-productive cough,  No-  coughing up of blood.              No-change in color of mucus.  No- wheezing.   Skin: No-   rash or lesions. GI:  No-   heartburn, indigestion, abdominal pain, nausea, vomiting, GU:  MS:  No-   joint pain or swelling.   Neuro- nothing unusual Psych:  No- change in mood or affect. No depression or anxiety.  No memory loss.  Objective:   Physical Exam General- Alert, Oriented, Affect-appropriate, Distress- none acute    thin Skin- rash-none, lesions- none, excoriation- none. Normally pale complexion Lymphadenopathy- none Head- atraumatic            Eyes- Gross vision intact, PERRLA, conjunctivae clear secretions    Periorbital edema  Ears- Hearing, canals normal            Nose- , no-Septal dev, mucus, polyps, erosion, perforation             Throat- Mallampati II , mucosa clear , drainage- none, tonsils- atrophic Neck- flexible , trachea midline, no stridor , thyroid nl, carotid no bruit Chest - symmetrical excursion , unlabored           Heart/CV- RRR , no murmur , no gallop  , no rub, nl s1 s2                           - JVD- none , edema-  none, stasis changes- none, varices- none           Lung- +decreased/ clear, wheeze- none, cough- none , dullness-none, rub- none               Chest wall-  Abd- Br/ Gen/ Rectal- Not done, not indicated Extrem- +synovial thickening on hands Neuro- grossly intact to observation

## 2012-10-18 ENCOUNTER — Telehealth: Payer: Self-pay | Admitting: Internal Medicine

## 2012-10-18 NOTE — Telephone Encounter (Signed)
Called and spoke with pt and she is aware of cxr results per CY.  Pt voiced her understanding and nothing further is needed.  

## 2012-10-22 DIAGNOSIS — J439 Emphysema, unspecified: Secondary | ICD-10-CM | POA: Insufficient documentation

## 2012-10-22 NOTE — Assessment & Plan Note (Signed)
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 Smoking cessation advised

## 2012-10-22 NOTE — Assessment & Plan Note (Addendum)
Support measures repeatedly offered. She is not making any effort to stop smoking Plan-chest x-ray. Reminder to get flu vaccine this fall

## 2012-10-22 NOTE — Assessment & Plan Note (Signed)
Clinical diagnosis reflects long smoking history Plan-continue to encourage smoking cessation

## 2012-11-29 ENCOUNTER — Other Ambulatory Visit: Payer: Self-pay

## 2012-11-29 DIAGNOSIS — Z1231 Encounter for screening mammogram for malignant neoplasm of breast: Secondary | ICD-10-CM

## 2012-12-28 ENCOUNTER — Ambulatory Visit: Payer: Medicare Other

## 2013-01-24 ENCOUNTER — Ambulatory Visit: Payer: Medicare Other

## 2013-03-16 ENCOUNTER — Telehealth: Payer: Self-pay | Admitting: Internal Medicine

## 2013-03-16 MED ORDER — AZITHROMYCIN 250 MG PO TABS: ORAL_TABLET | ORAL | Status: DC

## 2013-03-16 NOTE — Telephone Encounter (Signed)
Called and spoke with pt.Aware of recs. RX sent. Nothing further needed 

## 2013-03-16 NOTE — Telephone Encounter (Signed)
Called spoke with patient who reports HA, sinus pain behind her eyes, occasional sharp pains in both ears - more so in the right than the left, a lot of PND, decreased appetite, runny nose with clear nasal drainage that has become tinged with blood within the past week, prod cough with light brown mucus x2 weeks.  Pt denies any chest tightness, wheezing, body aches, nausea, vomiting, hemoptysis, edema.  She does c/o what feels like a low grade temp "every few days or so" and increased reflux.  Pt is requesting an antibiotic to the pharmacy.  Dr Maple HudsonYoung please advise, thank you. Last ov 9.4.14 w/ CDY - follow up in 1 year: Patient Instructions     Order- CXR Dx COPD  Please keep trying to stop smoking  Don't forget to get that flu shot !   Allergies  Allergen Reactions  . Doxycycline Itching  . Levofloxacin     REACTION: GI upset, mouth/throat sores  . Penicillins     REACTION: itch   Current Outpatient Prescriptions on File Prior to Visit  Medication Sig Dispense Refill  . fexofenadine (ALLEGRA) 180 MG tablet Take 180 mg by mouth daily as needed. Seasonal allergies      . fluticasone (FLONASE) 50 MCG/ACT nasal spray 1-2 puffs each nostril once daily at bedtime  16 g  prn  . levothyroxine (SYNTHROID, LEVOTHROID) 112 MCG tablet Take 112 mcg by mouth daily.       . Multiple Vitamin (MULTIVITAMIN) capsule Take 1 capsule by mouth daily.        . pantoprazole (PROTONIX) 40 MG tablet Take 1 tablet (40 mg total) by mouth 2 (two) times daily before a meal.  60 tablet  5  . traMADol (ULTRAM) 50 MG tablet Take 50 mg by mouth every 6 (six) hours as needed. Osteoarthritis pain Takes with Vicodin       No current facility-administered medications on file prior to visit.

## 2013-03-16 NOTE — Telephone Encounter (Signed)
Offer Zpak 

## 2013-06-13 ENCOUNTER — Encounter (INDEPENDENT_AMBULATORY_CARE_PROVIDER_SITE_OTHER): Payer: Self-pay

## 2013-06-13 ENCOUNTER — Ambulatory Visit
Admission: RE | Admit: 2013-06-13 | Discharge: 2013-06-13 | Disposition: A | Discharge status: Home / Self Care | Payer: Medicare Other | Source: Ambulatory Visit

## 2013-06-13 DIAGNOSIS — Z1231 Encounter for screening mammogram for malignant neoplasm of breast: Secondary | ICD-10-CM

## 2013-10-13 ENCOUNTER — Ambulatory Visit: Payer: Medicare Other | Admitting: Internal Medicine

## 2013-11-30 ENCOUNTER — Encounter: Payer: Self-pay | Admitting: Internal Medicine

## 2013-11-30 ENCOUNTER — Ambulatory Visit (INDEPENDENT_AMBULATORY_CARE_PROVIDER_SITE_OTHER): Payer: Medicare Other | Admitting: Internal Medicine

## 2013-11-30 ENCOUNTER — Ambulatory Visit (INDEPENDENT_AMBULATORY_CARE_PROVIDER_SITE_OTHER)
Admission: RE | Admit: 2013-11-30 | Discharge: 2013-11-30 | Disposition: A | Discharge status: Home / Self Care | Payer: Medicare Other | Source: Ambulatory Visit | Attending: Internal Medicine | Admitting: Internal Medicine

## 2013-11-30 VITALS — BP 148/76 | HR 65 | Ht 60.25 in | Wt 89.6 lb

## 2013-11-30 DIAGNOSIS — J441 Chronic obstructive pulmonary disease with (acute) exacerbation: Secondary | ICD-10-CM

## 2013-11-30 DIAGNOSIS — F17200 Nicotine dependence, unspecified, uncomplicated: Secondary | ICD-10-CM

## 2013-11-30 DIAGNOSIS — J432 Centrilobular emphysema: Secondary | ICD-10-CM

## 2013-11-30 DIAGNOSIS — Z720 Tobacco use: Secondary | ICD-10-CM

## 2013-11-30 NOTE — Patient Instructions (Addendum)
Order- CXR  Dx COPD   Order- Office spirometry   Dx COPD  Please ask Dr Venita SheffieldHusain's office what Pneumonia vaccines you have had, and how recently. If you haven't had the Prevnar-13 vaccine for pneumonia, then I would recommend it (one-time shot) and either we or Dr Donette LarryHusain can give that.

## 2013-11-30 NOTE — Progress Notes (Signed)
Patient ID: Sheila BeersSandra Beard, female    DOB: 21-Sep-1945, 68 y.o.   MRN: [id number]  HPI 09/30/10- [age] yoF smoker followed for allergic rhinitis and recurrent bronchitis. 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  She denies respiratory concerns through the long spring pollen season or summer heat. Still denies morning cough, phlegm or wheeze.  Dr Donette LarryHusain PCP gets annual CXR  No recent PFT Pneumovax at least once  02/13/11- 65 yoF smoker followed for allergic rhinitis and recurrent bronchitis. Has had a recent sore throat, green discharge from nose and chest and low-grade fever. She is now taking a Z-Pak and feels better but asks Depo-Medrol. Her daughter is using a Neti pot and we discussed saline lavage. Since last here had thyroidectomy in March, uncomplicated. PFT 10/14/10-reviewed with her, showing mild/ moderate obstructive airways disease with insignificant response to bronchodilator, hyperinflation and air trapping, normal diffusion. FEV1/FVC 0.65. I used this as a basis for talking again with her about smoking cessation today.  09/30/11- 65 yoF smoker followed for allergic rhinitis and recurrent bronchitis. Last week had bout of colored phelgm-green and thick in color, felt like fever but no temps when checked. Took prednisone medrol pak and this helped with symptoms. Back to baseline now but noting some vertex/tension headache, nasal congestion. Asks refill fluticasone. Still smoking. We discussed this again. She is not motivated to stop despite my efforts.  03/31/12- 67 yoF smoker followed for allergic rhinitis and recurrent bronchitis. FOLLOWS FOR: denies any SOB, wheezing, cough, or congestion She was hospitalized for upper GI bleed and transfused. Stabilized now and feeling much stronger. CXR 10/09/11 IMPRESSION:  COPD. No active disease.  Original Report Authenticated By: Cyndie ChimeKEVIN G. DOVER, M.D.  10/13/12- [age] yoF smoker followed  for allergic rhinitis and recurrent bronchitis. FOLLOWS FOR: Breathing is unchanged since last OV.  No complaints today Minor seasonal watery rhinorrhea without much sneezing. She feels well controlled. No change in dyspnea with exertion. No routine cough or phlegm. She is not making an effort to stop smoking despite discussion  11/30/13- 68 yoF smoker followed for allergic rhinitis and recurrent bronchitis, complicated by hx UGI bleed/ anemia FOLLOW FOR:  COPD; Asthma; runny nose, sinus pressure last week Plans flu vax in November    Still smokes against advise Doing fine, c/o watery nose CXR 10/13/12 IMPRESSION:  COPD changes.  No acute abnormalities.  Original Report Authenticated By: Ulyses SouthwardMark Boles, M.D. Office Spirometry- 11/30/13- Mild airway obstruction. FVC 2.81/ 116%, FEV1 1.49/ 80%, FEV1/FVC 0.53.  Review of Systems- see HPI Constitutional:   No-   weight loss, night sweats, fevers, chills, fatigue, lassitude. HEENT:   +  headaches, no-difficulty swallowing, tooth/dental problems, sore throat,       No-  sneezing, itching, ear ache,  +nasal congestion, +post nasal drip,  CV:  No-   chest pain, orthopnea, PND, swelling in lower extremities, anasarca, dizziness, palpitations Resp: little acute  shortness of breath with exertion or at rest.              No- productive cough,  No non-productive cough,  No-  coughing up of blood.              No-change in color of mucus.  No- wheezing.   Skin: No-   rash or lesions. GI:  No-   heartburn, indigestion, abdominal pain, nausea, vomiting, GU:  MS:  No-   joint pain or swelling.   Neuro-  nothing unusual Psych:  No- change in mood or affect. No depression or anxiety.  No memory loss.  Objective:   Physical Exam General- Alert, Oriented, Affect-appropriate, Distress- none acute    thin Skin- rash-none, lesions- none, excoriation- none. Normally pale complexion, thin Lymphadenopathy- none Head- atraumatic            Eyes- Gross vision  intact, PERRLA, conjunctivae clear secretions    Periorbital edema            Ears- Hearing, canals normal            Nose- , no-Septal dev, mucus, polyps, erosion, perforation             Throat- Mallampati II , mucosa clear , drainage- none, tonsils- atrophic Neck- flexible , trachea midline, no stridor , thyroid nl, carotid no bruit Chest - symmetrical excursion , unlabored           Heart/CV- RRR , no murmur , no gallop  , no rub, nl s1 s2                           - JVD- none , edema- none, stasis changes- none, varices- none           Lung- +decreased/ clear, wheeze- none, cough- none , dullness-none, rub- none               Chest wall-  Abd- Br/ Gen/ Rectal- Not done, not indicated Extrem- +synovial thickening on hands Neuro- grossly intact to observation

## 2013-12-01 NOTE — Assessment & Plan Note (Signed)
Continue counseling, pointing to obstructive change on PFT She hasn't felt motivated enough to quit

## 2013-12-01 NOTE — Assessment & Plan Note (Addendum)
Milder COPD than she might expect for having smoked so long. Emphasis on cessation Plan- CXR. She will check on pneumococcal vaccine status with Dr Donette LarryHusain.

## 2013-12-01 NOTE — Progress Notes (Signed)
Quick Note:  Patient returned call. Advised of lab results / recs as stated by CY. Pt verbalized understanding and denied any questions. ______ 

## 2014-03-17 ENCOUNTER — Encounter (HOSPITAL_COMMUNITY): Payer: Self-pay | Admitting: *Deleted

## 2014-03-17 ENCOUNTER — Emergency Department (HOSPITAL_COMMUNITY)
Admission: EM | Admit: 2014-03-17 | Discharge: 2014-03-17 | Disposition: A | Discharge status: Home / Self Care | Payer: Medicare Other | Attending: Emergency Medicine | Admitting: Emergency Medicine

## 2014-03-17 DIAGNOSIS — E079 Disorder of thyroid, unspecified: Secondary | ICD-10-CM | POA: Insufficient documentation

## 2014-03-17 DIAGNOSIS — M1990 Unspecified osteoarthritis, unspecified site: Secondary | ICD-10-CM | POA: Diagnosis not present

## 2014-03-17 DIAGNOSIS — Z79899 Other long term (current) drug therapy: Secondary | ICD-10-CM | POA: Diagnosis not present

## 2014-03-17 DIAGNOSIS — K219 Gastro-esophageal reflux disease without esophagitis: Secondary | ICD-10-CM | POA: Insufficient documentation

## 2014-03-17 DIAGNOSIS — K088 Other specified disorders of teeth and supporting structures: Secondary | ICD-10-CM | POA: Diagnosis not present

## 2014-03-17 DIAGNOSIS — H9201 Otalgia, right ear: Secondary | ICD-10-CM | POA: Diagnosis not present

## 2014-03-17 DIAGNOSIS — K0889 Other specified disorders of teeth and supporting structures: Secondary | ICD-10-CM

## 2014-03-17 DIAGNOSIS — J3489 Other specified disorders of nose and nasal sinuses: Secondary | ICD-10-CM | POA: Diagnosis not present

## 2014-03-17 DIAGNOSIS — Z880 Allergy status to penicillin: Secondary | ICD-10-CM | POA: Diagnosis not present

## 2014-03-17 DIAGNOSIS — Z720 Tobacco use: Secondary | ICD-10-CM | POA: Insufficient documentation

## 2014-03-17 MED ORDER — PENICILLIN V POTASSIUM 500 MG PO TABS: 500.0000 mg | ORAL_TABLET | Freq: Four times a day (QID) | ORAL | Status: AC

## 2014-03-17 MED ORDER — HYDROCODONE-ACETAMINOPHEN 5-325 MG PO TABS: 1.0000 | ORAL_TABLET | ORAL | Status: DC | PRN

## 2014-03-17 NOTE — ED Notes (Signed)
The npt has had a toohache since this am and now she has pain nin her rt jaw also

## 2014-03-17 NOTE — ED Provider Notes (Signed)
CSN: [id number]     Arrival date & time 03/17/14  1850 History  This chart was scribed for Joycie Peek, PA-C working with No att. providers found by Elveria Rising, ED Scribe. This patient was seen in room TR06C/TR06C and the patient's care was started at 7:32 PM.   Chief Complaint  Patient presents with  . Dental Pain   The history is provided by the patient. No language interpreter was used.   HPI Comments: [name] is a 69 y.o. female with PMHx of arthritis, GERD, Hypothyroidism status post thyroidectomy, and anemia who presents to the Emergency Department complaining of worsening upper right dental pain, onset this morning. Patient reports radiation into the right side of her face, right eye, and right ear. Patient reports treatment with Tramadol: 2 tablets every four hours and BC powder that has not touched the pain. Patient reports similar dental pain 3-4 years that presented similarly that was diagnosed as an abscess. Patient reports subjective fever. Patient denies trouble swallowing, tongue swelling, nausea, vomiting, new rhinorrhea, visual changes, or pain with chewing.    Past Medical History  Diagnosis Date  . Tobacco use disorder   . Chronic rhinitis   . Acute bronchitis   . GERD (gastroesophageal reflux disease)   . Thyroid disease   . Hyperthyroidism 2012    thyroidectomy  . Arthritis   . Anemia    Past Surgical History  Procedure Laterality Date  . Total abdominal hysterectomy    . Gastric outlet obstruction release      7 years ago  . Thyroidectomy  2012  . Esophagogastroduodenoscopy  10/22/2011    Procedure: ESOPHAGOGASTRODUODENOSCOPY (EGD);  Surgeon: Willis Modena, MD;  Location: [facility] ENDOSCOPY;  Service: Endoscopy;  Laterality: Left;   Family History  Problem Relation Age of Onset  . Cancer Father   . Cirrhosis Mother    History  Substance Use Topics  . Smoking status: Current Every Day Smoker -- 0.50 packs/day for 40 years    Types: Cigarettes  .  Smokeless tobacco: Never Used     Comment: 1 ppd  . Alcohol Use: No   OB History    No data available     Review of Systems  Constitutional: Negative for fever and chills.  HENT: Positive for dental problem, ear pain, facial swelling and rhinorrhea. Negative for trouble swallowing.   Respiratory: Negative for shortness of breath.   Cardiovascular: Negative for chest pain.  Gastrointestinal: Negative for nausea and vomiting.  Neurological: Negative for headaches.   Allergies  Doxycycline; Levofloxacin; and Penicillins  Home Medications   Prior to Admission medications   Medication Sig Start Date End Date Taking? Authorizing Provider  HYDROcodone-acetaminophen (NORCO/VICODIN) 5-325 MG per tablet Take 1-2 tablets by mouth every 4 (four) hours as needed for moderate pain or severe pain. 03/17/14   Sharlene Motts, PA-C  levothyroxine (SYNTHROID, LEVOTHROID) 112 MCG tablet Take 125 mcg by mouth daily.  01/05/11   Historical Provider, MD  Multiple Vitamin (MULTIVITAMIN) capsule Take 1 capsule by mouth daily.      Historical Provider, MD  pantoprazole (PROTONIX) 40 MG tablet Take 1 tablet (40 mg total) by mouth 2 (two) times daily before a meal. 10/23/11 12/22/12  Georgann Housekeeper, MD  penicillin v potassium (VEETID) 500 MG tablet Take 1 tablet (500 mg total) by mouth 4 (four) times daily. 03/17/14 03/24/14  Earle Gell Cathe Bilger, PA-C  traMADol (ULTRAM) 50 MG tablet Take 50 mg by mouth every 6 (six) hours as needed. Osteoarthritis  pain Takes with Vicodin    Historical Provider, MD   Triage Vitals: BP 171/69 mmHg  Pulse 70  Temp(Src) 98.4 F (36.9 C)  Resp 18  Ht 5\' 2"  (1.575 m)  Wt 90 lb (40.824 kg)  BMI 16.46 kg/m2  SpO2 100% Physical Exam  Constitutional: She is oriented to person, place, and time. She appears well-developed and well-nourished. No distress.  HENT:  Head: Normocephalic and atraumatic.  Discomfort to Upper right incisor and bicuspid. Overall poor dentition. Multiple  missing teeth with active caries. Will need follow-up with dentistry. Mucous membranes are moist. No unilateral tonsillar swelling, uvula midline, no glossal swelling or elevation. No trismus. No fluctuance or evidence of a drainable abscess. No other evidence of emergent infection, Ludwig or Vincents angina. Tolerating secretions well. Patent airway   Eyes: EOM are normal.  Neck: Neck supple. No tracheal deviation present.  Cardiovascular: Normal rate.   Pulmonary/Chest: Effort normal. No respiratory distress.  Musculoskeletal: Normal range of motion.  Neurological: She is alert and oriented to person, place, and time.  Skin: Skin is warm and dry.  Psychiatric: She has a normal mood and affect. Her behavior is normal.  Nursing note and vitals reviewed.   ED Course  Procedures (including critical care time)  NERVE BLOCK Performed by: Mayme GentaBen Jacky Hartung Consent: Verbal consent obtained. Required items: required blood products, implants, devices, and special equipment available Time out: Immediately prior to procedure a "time out" was called to verify the correct patient, procedure, equipment, support staff and site/side marked as required.  Indication: tooth pain Nerve block body site: infraorbital   Preparation: Patient was prepped and draped in the usual sterile fashion. Needle gauge: 24 G Location technique: anatomical landmarks  Local anesthetic: Bupivacaine   Anesthetic total: 2 ml  Outcome: pain improved Patient tolerance: Patient tolerated the procedure well with no immediate complications.     COORDINATION OF CARE: 7:42 PM- Treatment includes nerve block for pain relief with plans to discharge with antibiotic and pain medication. Patient plans to follow up with her dentist Monday morning. Discussed treatment plan with patient at bedside and patient agreed to plan.   Labs Review Labs Reviewed - No data to display  Imaging Review No results found.   EKG  Interpretation None     Meds given in ED:  Medications - No data to display  Discharge Medication List as of 03/17/2014  8:47 PM    START taking these medications   Details  penicillin v potassium (VEETID) 500 MG tablet Take 1 tablet (500 mg total) by mouth 4 (four) times daily., Starting 03/17/2014, Until Sat 03/24/14, Print       Filed Vitals:   03/17/14 1859 03/17/14 1902 03/17/14 2052  BP: 171/69  146/53  Pulse: 70  70  Temp: 98.4 F (36.9 C)    Resp: 18  14  Height:  5\' 2"  (1.575 m)   Weight:  90 lb (40.824 kg)   SpO2: 100%  98%    MDM  Vitals stable - WNL -afebrile Pt resting comfortably in ED.  reports the nerve block greatly improved her pain. She is very Adult nurseappreciative. PE--not concerning further acute or emergent pathology. No evidence of drainable abscess.   DDX--patient tooth pain similar to pain she experienced with dental abscess in the past. Feels better after infraorbital nerve block. Will DC with antibiotics, pain medicines (driving precautions given) and referral for dentistry for definitive care. Low concern for peritonsillar abscess, Ludwig angina, giant cell arteritis or other  acute or emergent infection or pathology.  I discussed all relevant lab findings and imaging results with pt and they verbalized understanding. Discussed f/u with PCP within 48 hrs and return precautions, pt very amenable to plan.  Final diagnoses:  Pain, dental    I personally performed the services described in this documentation, which was scribed in my presence. The recorded information has been reviewed and is accurate.    Sharlene Motts, PA-C 03/18/14 0154  Sharlene Motts, PA-C 03/18/14 8119  Arby Barrette, MD 03/18/14 [phone]

## 2014-03-17 NOTE — Discharge Instructions (Signed)
Dental Pain A tooth ache may be caused by cavities (tooth decay). Cavities expose the nerve of the tooth to air and hot or cold temperatures. It may come from an infection or abscess (also called a boil or furuncle) around your tooth. It is also often caused by dental caries (tooth decay). This causes the pain you are having. DIAGNOSIS  Your caregiver can diagnose this problem by exam. TREATMENT   If caused by an infection, it may be treated with medications which kill germs (antibiotics) and pain medications as prescribed by your caregiver. Take medications as directed.  Only take over-the-counter or prescription medicines for pain, discomfort, or fever as directed by your caregiver.  Whether the tooth ache today is caused by infection or dental disease, you should see your dentist as soon as possible for further care. SEEK MEDICAL CARE IF: The exam and treatment you received today has been provided on an emergency basis only. This is not a substitute for complete medical or dental care. If your problem worsens or new problems (symptoms) appear, and you are unable to meet with your dentist, call or return to this location. SEEK IMMEDIATE MEDICAL CARE IF:   You have a fever.  You develop redness and swelling of your face, jaw, or neck.  You are unable to open your mouth.  You have severe pain uncontrolled by pain medicine. MAKE SURE YOU:   Understand these instructions.  Will watch your condition.  Will get help right away if you are not doing well or get worse. Document Released: 01/26/2005 Document Revised: 04/20/2011 Document Reviewed: 09/14/2007 [facility] Patient Information 2015 [place] [employer], [place]. This information is not intended to replace advice given to you by your health care provider. Make sure you discuss any questions you have with your health care provider.   You were evaluated in the ED today for your dental pain. There does not appear to be any acute infections or  emergent causes that can be treated in the ED. You will be given antibiotics and pain medicines, please take these as directed. It is important for you to follow-up with Dr. Lucky CowboyKnox, and general dentistry for definitive care of your dental pain. Return to ED for new or worsening symptoms.

## 2014-06-15 ENCOUNTER — Other Ambulatory Visit: Payer: Self-pay | Admitting: Internal Medicine

## 2014-06-15 DIAGNOSIS — R140 Abdominal distension (gaseous): Secondary | ICD-10-CM

## 2014-06-19 ENCOUNTER — Other Ambulatory Visit: Payer: Self-pay | Admitting: Internal Medicine

## 2014-06-19 ENCOUNTER — Ambulatory Visit
Admission: RE | Admit: 2014-06-19 | Discharge: 2014-06-19 | Disposition: A | Discharge status: Home / Self Care | Payer: BLUE CROSS/BLUE SHIELD | Source: Ambulatory Visit | Attending: Internal Medicine | Admitting: Internal Medicine

## 2014-06-19 DIAGNOSIS — R140 Abdominal distension (gaseous): Secondary | ICD-10-CM

## 2014-07-05 ENCOUNTER — Other Ambulatory Visit: Payer: Self-pay | Admitting: Gastroenterology

## 2014-07-05 ENCOUNTER — Ambulatory Visit
Admission: RE | Admit: 2014-07-05 | Discharge: 2014-07-05 | Disposition: A | Discharge status: Home / Self Care | Payer: Medicare Other | Source: Ambulatory Visit | Attending: Gastroenterology | Admitting: Gastroenterology

## 2014-07-05 DIAGNOSIS — R140 Abdominal distension (gaseous): Secondary | ICD-10-CM

## 2014-07-13 ENCOUNTER — Other Ambulatory Visit: Payer: Self-pay | Admitting: Gastroenterology

## 2014-07-19 ENCOUNTER — Other Ambulatory Visit: Payer: Self-pay | Admitting: Gastroenterology

## 2014-07-24 ENCOUNTER — Encounter (HOSPITAL_COMMUNITY)
Admission: RE | Disposition: A | Discharge status: Home / Self Care | Payer: Self-pay | Source: Ambulatory Visit | Attending: Gastroenterology

## 2014-07-24 ENCOUNTER — Ambulatory Visit (HOSPITAL_COMMUNITY)
Admission: RE | Admit: 2014-07-24 | Discharge: 2014-07-24 | Disposition: A | Discharge status: Home / Self Care | Payer: Medicare Other | Source: Ambulatory Visit | Attending: Gastroenterology | Admitting: Gastroenterology

## 2014-07-24 ENCOUNTER — Encounter (HOSPITAL_COMMUNITY): Payer: Self-pay | Admitting: *Deleted

## 2014-07-24 DIAGNOSIS — Z881 Allergy status to other antibiotic agents status: Secondary | ICD-10-CM | POA: Insufficient documentation

## 2014-07-24 DIAGNOSIS — Z880 Allergy status to penicillin: Secondary | ICD-10-CM | POA: Diagnosis not present

## 2014-07-24 DIAGNOSIS — G43909 Migraine, unspecified, not intractable, without status migrainosus: Secondary | ICD-10-CM | POA: Diagnosis not present

## 2014-07-24 DIAGNOSIS — R140 Abdominal distension (gaseous): Secondary | ICD-10-CM | POA: Insufficient documentation

## 2014-07-24 DIAGNOSIS — M810 Age-related osteoporosis without current pathological fracture: Secondary | ICD-10-CM | POA: Diagnosis not present

## 2014-07-24 DIAGNOSIS — Z90710 Acquired absence of both cervix and uterus: Secondary | ICD-10-CM | POA: Diagnosis not present

## 2014-07-24 DIAGNOSIS — E039 Hypothyroidism, unspecified: Secondary | ICD-10-CM | POA: Diagnosis not present

## 2014-07-24 DIAGNOSIS — K311 Adult hypertrophic pyloric stenosis: Secondary | ICD-10-CM | POA: Diagnosis not present

## 2014-07-24 DIAGNOSIS — K3189 Other diseases of stomach and duodenum: Secondary | ICD-10-CM | POA: Insufficient documentation

## 2014-07-24 DIAGNOSIS — M1990 Unspecified osteoarthritis, unspecified site: Secondary | ICD-10-CM | POA: Insufficient documentation

## 2014-07-24 DIAGNOSIS — K208 Other esophagitis: Secondary | ICD-10-CM | POA: Diagnosis not present

## 2014-07-24 DIAGNOSIS — Y838 Other surgical procedures as the cause of abnormal reaction of the patient, or of later complication, without mention of misadventure at the time of the procedure: Secondary | ICD-10-CM | POA: Diagnosis not present

## 2014-07-24 DIAGNOSIS — K9189 Other postprocedural complications and disorders of digestive system: Secondary | ICD-10-CM | POA: Insufficient documentation

## 2014-07-24 DIAGNOSIS — Z931 Gastrostomy status: Secondary | ICD-10-CM | POA: Insufficient documentation

## 2014-07-24 HISTORY — PX: ESOPHAGOGASTRODUODENOSCOPY: SHX5428

## 2014-07-24 HISTORY — DX: Hypothyroidism, unspecified: E03.9

## 2014-07-24 SURGERY — EGD (ESOPHAGOGASTRODUODENOSCOPY)
Anesthesia: Moderate Sedation

## 2014-07-24 MED ORDER — SODIUM CHLORIDE 0.9 % IV SOLN
INTRAVENOUS | Status: DC
  Administered 2014-07-24: 500 mL via INTRAVENOUS

## 2014-07-24 MED ORDER — SODIUM CHLORIDE 0.9 % IV SOLN: INTRAVENOUS | Status: DC

## 2014-07-24 MED ORDER — MIDAZOLAM HCL 10 MG/2ML IJ SOLN
INTRAMUSCULAR | Status: DC | PRN
  Administered 2014-07-24 (×2): 2 mg via INTRAVENOUS

## 2014-07-24 MED ORDER — MIDAZOLAM HCL 5 MG/ML IJ SOLN
INTRAMUSCULAR | Status: AC
  Filled 2014-07-24: qty 2

## 2014-07-24 MED ORDER — FENTANYL CITRATE (PF) 100 MCG/2ML IJ SOLN
INTRAMUSCULAR | Status: DC | PRN
  Administered 2014-07-24: 25 ug via INTRAVENOUS

## 2014-07-24 MED ORDER — BUTAMBEN-TETRACAINE-BENZOCAINE 2-2-14 % EX AERO
INHALATION_SPRAY | CUTANEOUS | Status: DC | PRN
  Administered 2014-07-24: 1 via TOPICAL

## 2014-07-24 MED ORDER — FENTANYL CITRATE (PF) 100 MCG/2ML IJ SOLN
INTRAMUSCULAR | Status: AC
  Filled 2014-07-24: qty 2

## 2014-07-24 NOTE — H&P (Signed)
  Problem: Gastric distention post Billroth II gastrectomy  History: The patient is a 69 year old female born Jul 04, 1945. 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 She has developed postprandial abdominal distention without abdominal pain, nausea, or vomiting. Her abdominal distention resolves overnight.  Acute abdominal x-ray series performed in my office when she clearly had abdominal distention showed marked distention of the stomach not small intestine or large intestine.  In 2013, the patient underwent an esophagogastroduodenoscopy and was found to have a gastric anastomotic ulcer.  She is scheduled for diagnostic esophagogastroduodenoscopy to rule out mechanical gastric outlet obstruction.  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 3 unit blood transfusion in September 2013 to treat gastric anastomotic ulcer. Osteoarthritis. Osteoporosis. Migraine headache syndrome. Hypothyroidism. Total abdominal hysterectomy. Thyroidectomy.  Medication allergies: Penicillin. Levofloxacin.  Exam: The patient is alert and lying comfortably on the endoscopy stretcher. Abdomen is soft and slightly distended to palpation. Cardiac exam reveals a regular rhythm. Lungs are clear to auscultation.  Plan: Proceed with diagnostic esophagogastroduodenoscopy

## 2014-07-24 NOTE — Op Note (Signed)
Problem: Gastric distention post Billroth II gastrectomy. 10/22/2011 diagnostic esophagogastroduodenoscopy showed a large serpiginous deep but clean-based ulcer at the gastro-jejunostomy anastomosis. No H. pylori gastritis. 06/19/2014 barium upper GI with small bowel follow-through x-ray series showed a normal esophagus, incomplete examination of the gastric pouch due to retained food, and normal small bowel-terminal ileum.  Endoscopist: Danise Edge  Premedication: Versed 4 mg. Fentanyl 25 g.  Procedure: Diagnostic esophagogastroduodenoscopy The patient was placed in the left lateral decubitus position. The Pentax gastroscope was passed through the posterior hypopharynx into the proximal esophagus without difficulty. I did not visualize the vocal cords.  Esophagoscopy: The proximal and mid segments of the esophageal mucosa appeared normal. The distal esophageal mucosa was ulcerated and friable probably due to gastroesophageal reflux secondary to partial gastric outlet obstruction. I could not identify the squamocolumnar junction. The esophagogastric junction was noted at approximately 40 cm from the incisor teeth.  Gastroscopy: There was a large amount of solid and liquid food filling the gastric pouch. Retroflexed view of the gastric cardia was normal. The Billroth II surgical anastomosis was severely stenosed and I was unable to traverse the surgical anastomosis with the 9 mm Pentax gastroscope. Due to the large amount of retained liquid and soft food in the stomach, I was unable to assess the past majority of the gastric mucosa.  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  Recommendation: The patient will require surgery to relieve her partial gastric outlet obstruction. She currently takes pantoprazole 40 mg each morning.

## 2014-07-24 NOTE — Discharge Instructions (Signed)
Esophagogastroduodenoscopy °Care After °Refer to this sheet in the next few weeks. These instructions provide you with information on caring for yourself after your procedure. Your caregiver may also give you more specific instructions. Your treatment has been planned according to current medical practices, but problems sometimes occur. Call your caregiver if you have any problems or questions after your procedure.  °HOME CARE INSTRUCTIONS °· Do not eat or drink anything until the numbing medicine (local anesthetic) has worn off and your gag reflex has returned. You will know that the local anesthetic has worn off when you can swallow comfortably. °· Do not drive for 12 hours after the procedure or as directed by your caregiver. °· Only take medicines as directed by your caregiver. °SEEK MEDICAL CARE IF:  °· You cannot stop coughing. °· You are not urinating at all or less than usual. °SEEK IMMEDIATE MEDICAL CARE IF: °· You have difficulty swallowing. °· You cannot eat or drink. °· You have worsening throat or chest pain. °· You have dizziness, lightheadedness, or you faint. °· You have nausea or vomiting. °· You have chills. °· You have a fever. °· You have severe abdominal pain. °· You have black, tarry, or bloody stools. °Document Released: 01/13/2012 Document Reviewed: 01/13/2012 °ExitCare® Patient Information ©2015 [employer]. This information is not intended to replace advice given to you by your health care provider. Make sure you discuss any questions you have with your health care provider. ° ° °Conscious Sedation, Adult, Care After °Refer to this sheet in the next few weeks. These instructions provide you with information on caring for yourself after your procedure. Your health care provider may also give you more specific instructions. Your treatment has been planned according to current medical practices, but problems sometimes occur. Call your health care provider if you have any problems or  questions after your procedure. °WHAT TO EXPECT AFTER THE PROCEDURE  °After your procedure: °· You may feel sleepy, clumsy, and have poor balance for several hours. °· Vomiting may occur if you eat too soon after the procedure. °HOME CARE INSTRUCTIONS °· Do not participate in any activities where you could become injured for at least 24 hours. Do not: °¨ Drive. °¨ Swim. °¨ Ride a bicycle. °¨ Operate heavy machinery. °¨ Cook. °¨ Use power tools. °¨ Climb ladders. °¨ Work from a high place. °· Do not make important decisions or sign legal documents until you are improved. °· If you vomit, drink water, juice, or soup when you can drink without vomiting. Make sure you have little or no nausea before eating solid foods. °· Only take over-the-counter or prescription medicines for pain, discomfort, or fever as directed by your health care provider. °· Make sure you and your family fully understand everything about the medicines given to you, including what side effects may occur. °· You should not drink alcohol, take sleeping pills, or take medicines that cause drowsiness for at least 24 hours. °· If you smoke, do not smoke without supervision. °· If you are feeling better, you may resume normal activities 24 hours after you were sedated. °· Keep all appointments with your health care provider. °SEEK MEDICAL CARE IF: °· Your skin is pale or bluish in color. °· You continue to feel nauseous or vomit. °· Your pain is getting worse and is not helped by medicine. °· You have bleeding or swelling. °· You are still sleepy or feeling clumsy after 24 hours. °SEEK IMMEDIATE MEDICAL CARE IF: °· You develop a   rash. °· You have difficulty breathing. °· You develop any type of allergic problem. °· You have a fever. °MAKE SURE YOU: °· Understand these instructions. °· Will watch your condition. °· Will get help right away if you are not doing well or get worse. °Document Released: 11/16/2012 Document Reviewed: 11/16/2012 °ExitCare®  Patient Information ©2015 [employer], LLC. This information is not intended to replace advice given to you by your health care provider. Make sure you discuss any questions you have with your health care provider. ° °

## 2014-07-25 ENCOUNTER — Encounter (HOSPITAL_COMMUNITY): Payer: Self-pay | Admitting: Gastroenterology

## 2014-08-06 ENCOUNTER — Telehealth: Payer: Self-pay | Admitting: Internal Medicine

## 2014-08-06 MED ORDER — AZITHROMYCIN 250 MG PO TABS: ORAL_TABLET | ORAL | Status: DC

## 2014-08-06 NOTE — Telephone Encounter (Signed)
Spoke with pt. States that she has a sinus infection. Reports sinus congestion, coughing with production of green mucus. Denies chest tightness, SOB, wheezing or fever. Would like a Zpack to be called in.  Allergies  Allergen Reactions  . Doxycycline Itching  . Levofloxacin     REACTION: GI upset, mouth/throat sores  . Penicillins     REACTION: itch   Current Outpatient Prescriptions on File Prior to Visit  Medication Sig Dispense Refill  . HYDROcodone-acetaminophen (NORCO/VICODIN) 5-325 MG per tablet Take 1-2 tablets by mouth every 4 (four) hours as needed for moderate pain or severe pain. 10 tablet 0  . levothyroxine (SYNTHROID, LEVOTHROID) 112 MCG tablet Take 125 mcg by mouth daily.     . Multiple Vitamin (MULTIVITAMIN) capsule Take 1 capsule by mouth daily.      . pantoprazole (PROTONIX) 40 MG tablet Take 1 tablet (40 mg total) by mouth 2 (two) times daily before a meal. 60 tablet 5  . traMADol (ULTRAM) 50 MG tablet Take 50 mg by mouth every 6 (six) hours as needed. Osteoarthritis pain Takes with Vicodin     No current facility-administered medications on file prior to visit.    CY - please advise. Thanks.

## 2014-08-06 NOTE — Telephone Encounter (Signed)
Ok Zpak

## 2014-09-07 ENCOUNTER — Other Ambulatory Visit: Payer: Self-pay | Admitting: General Surgery

## 2014-09-21 NOTE — Patient Instructions (Addendum)
Kerryann Allaire  09/21/2014   Your procedure is scheduled on: Tuesday 09/25/2014  Report to [facility] Main  Entrance take [place]  elevators to 3rd floor to  Short Stay Center at   0830  AM.  Call this number if you have problems the morning of surgery [phone]   Remember: ONLY 1 PERSON MAY GO WITH YOU TO SHORT STAY TO GET  READY MORNING OF YOUR SURGERY.  Do not eat food or drink liquids :After Midnight.     Take these medicines the morning of surgery with A SIP OF WATER: Levothyroxine, Protonix                               You may not have any metal on your body including hair pins and              piercings  Do not wear jewelry, make-up, lotions, powders or perfumes, deodorant             Do not wear nail polish.  Do not shave  48 hours prior to surgery.  .   Do not bring valuables to the hospital. [facility] IS NOT             RESPONSIBLE   FOR VALUABLES.  Contacts, dentures or bridgework may not be worn into surgery.  Leave suitcase in the car. After surgery it may be brought to your room.        Special Instructions: coughing and deep breathing exercises, leg exercises              Please read over the following fact sheets you were given: _____________________________________________________________________             [facility] - Preparing for Surgery Before surgery, you can play an important role.  Because skin is not sterile, your skin needs to be as free of germs as possible.  You can reduce the number of germs on your skin by washing with CHG (chlorahexidine gluconate) soap before surgery.  CHG is an antiseptic cleaner which kills germs and bonds with the skin to continue killing germs even after washing. Please DO NOT use if you have an allergy to CHG or antibacterial soaps.  If your skin becomes reddened/irritated stop using the CHG and inform your nurse when you arrive at Short Stay. Do not shave (including legs and underarms) for at  least 48 hours prior to the first CHG shower.  You may shave your face/neck. Please follow these instructions carefully:  1.  Shower with CHG Soap the night before surgery and the  morning of Surgery.  2.  If you choose to wash your hair, wash your hair first as usual with your  normal  shampoo.  3.  After you shampoo, rinse your hair and body thoroughly to remove the  shampoo.                           4.  Use CHG as you would any other liquid soap.  You can apply chg directly  to the skin and wash                       Gently with a scrungie or clean washcloth.  5.  Apply the CHG Soap to your body ONLY  FROM THE NECK DOWN.   Do not use on face/ open                           Wound or open sores. Avoid contact with eyes, ears mouth and genitals (private parts).                       Wash face,  Genitals (private parts) with your normal soap.             6.  Wash thoroughly, paying special attention to the area where your surgery  will be performed.  7.  Thoroughly rinse your body with warm water from the neck down.  8.  DO NOT shower/wash with your normal soap after using and rinsing off  the CHG Soap.                9.  Pat yourself dry with a clean towel.            10.  Wear clean pajamas.            11.  Place clean sheets on your bed the night of your first shower and do not  sleep with pets. Day of Surgery : Do not apply any lotions/deodorants the morning of surgery.  Please wear clean clothes to the hospital/surgery center.  FAILURE TO FOLLOW THESE INSTRUCTIONS MAY RESULT IN THE CANCELLATION OF YOUR SURGERY PATIENT SIGNATURE_________________________________  NURSE SIGNATURE__________________________________  ________________________________________________________________________   Adam Phenix  An incentive spirometer is a tool that can help keep your lungs clear and active. This tool measures how well you are filling your lungs with each breath. Taking long deep breaths  may help reverse or decrease the chance of developing breathing (pulmonary) problems (especially infection) following:  A long period of time when you are unable to move or be active. BEFORE THE PROCEDURE   If the spirometer includes an indicator to show your best effort, your nurse or respiratory therapist will set it to a desired goal.  If possible, sit up straight or lean slightly forward. Try not to slouch.  Hold the incentive spirometer in an upright position. INSTRUCTIONS FOR USE  1. Sit on the edge of your bed if possible, or sit up as far as you can in bed or on a chair. 2. Hold the incentive spirometer in an upright position. 3. Breathe out normally. 4. Place the mouthpiece in your mouth and seal your lips tightly around it. 5. Breathe in slowly and as deeply as possible, raising the piston or the ball toward the top of the column. 6. Hold your breath for 3-5 seconds or for as long as possible. Allow the piston or ball to fall to the bottom of the column. 7. Remove the mouthpiece from your mouth and breathe out normally. 8. Rest for a few seconds and repeat Steps 1 through 7 at least 10 times every 1-2 hours when you are awake. Take your time and take a few normal breaths between deep breaths. 9. The spirometer may include an indicator to show your best effort. Use the indicator as a goal to work toward during each repetition. 10. After each set of 10 deep breaths, practice coughing to be sure your lungs are clear. If you have an incision (the cut made at the time of surgery), support your incision when coughing by placing a pillow or rolled up towels firmly against it. Once you  are able to get out of bed, walk around indoors and cough well. You may stop using the incentive spirometer when instructed by your caregiver.  RISKS AND COMPLICATIONS  Take your time so you do not get dizzy or light-headed.  If you are in pain, you may need to take or ask for pain medication before doing  incentive spirometry. It is harder to take a deep breath if you are having pain. AFTER USE  Rest and breathe slowly and easily.  It can be helpful to keep track of a log of your progress. Your caregiver can provide you with a simple table to help with this. If you are using the spirometer at home, follow these instructions: SEEK MEDICAL CARE IF:   You are having difficultly using the spirometer.  You have trouble using the spirometer as often as instructed.  Your pain medication is not giving enough relief while using the spirometer.  You develop fever of 100.5 F (38.1 C) or higher. SEEK IMMEDIATE MEDICAL CARE IF:   You cough up bloody sputum that had not been present before.  You develop fever of 102 F (38.9 C) or greater.  You develop worsening pain at or near the incision site. MAKE SURE YOU:   Understand these instructions.  Will watch your condition.  Will get help right away if you are not doing well or get worse. Document Released: 06/08/2006 Document Revised: 04/20/2011 Document Reviewed: 08/09/2006 [facility] Patient Information 2014 [place], [place].   ________________________________________________________________________

## 2014-09-24 ENCOUNTER — Encounter (HOSPITAL_COMMUNITY): Payer: Self-pay

## 2014-09-24 ENCOUNTER — Encounter (INDEPENDENT_AMBULATORY_CARE_PROVIDER_SITE_OTHER): Payer: Self-pay

## 2014-09-24 ENCOUNTER — Encounter (HOSPITAL_COMMUNITY)
Admission: RE | Admit: 2014-09-24 | Discharge: 2014-09-24 | Disposition: A | Discharge status: Home / Self Care | Payer: Medicare Other | Source: Ambulatory Visit | Attending: General Surgery | Admitting: General Surgery

## 2014-09-24 DIAGNOSIS — K311 Adult hypertrophic pyloric stenosis: Secondary | ICD-10-CM | POA: Insufficient documentation

## 2014-09-24 DIAGNOSIS — Z01818 Encounter for other preprocedural examination: Secondary | ICD-10-CM | POA: Insufficient documentation

## 2014-09-24 HISTORY — DX: Headache, unspecified: R51.9

## 2014-09-24 HISTORY — DX: Chronic obstructive pulmonary disease, unspecified: J44.9

## 2014-09-24 HISTORY — DX: Headache: R51

## 2014-09-24 LAB — CBC
HCT: 40.1 % (ref 36.0–46.0)
HEMOGLOBIN: 12.9 g/dL (ref 12.0–15.0)
MCH: 32 pg (ref 26.0–34.0)
MCHC: 32.2 g/dL (ref 30.0–36.0)
MCV: 99.5 fL (ref 78.0–100.0)
Platelets: 168 10*3/uL (ref 150–400)
RBC: 4.03 MIL/uL (ref 3.87–5.11)
RDW: 12.7 % (ref 11.5–15.5)
WBC: 4.1 10*3/uL (ref 4.0–10.5)

## 2014-09-24 LAB — BASIC METABOLIC PANEL
Anion gap: 8 (ref 5–15)
BUN: 16 mg/dL (ref 6–20)
CO2: 25 mmol/L (ref 22–32)
Calcium: 8.9 mg/dL (ref 8.9–10.3)
Chloride: 101 mmol/L (ref 101–111)
Creatinine, Ser: 0.59 mg/dL (ref 0.44–1.00)
GFR calc non Af Amer: >60 mL/min (ref >60)
Glucose, Bld: 76 mg/dL (ref 65–99)
POTASSIUM: 3.9 mmol/L (ref 3.5–5.1)
SODIUM: 134 mmol/L — AB (ref 135–145)

## 2014-09-24 NOTE — H&P (Signed)
History of Present Illness Sheila Salina T. Isadore Palecek MD; 09/06/2014 11:47 AM) The patient is a 69 year old female who presents with a gastric outlet obstruction. She has a long history of problems with peptic ulcer disease and gastric emptying issues. Patient was diagnosed with peptic ulcer disease at least as early as the early 2000s. She has been a chronic cigarette smoker. Also arthritis and history of NSAID use. 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 In December of 2005 she underwent laparoscopic truncal vagotomy and gastrojejunostomy by Dr. Danna Hefty. The patient states that she had some temporary improvement in her symptoms but continued to have early satiety and bloating after meals. This has gradually worsened and become very significant over the last year or so. She feels she is unable to eat much at all or she will develop significant upper abdominal swelling and pressure, bloating and nausea and belching. She has to severely limit her diet and really have symptoms after any sort of oral intake. She states her symptoms make her miserable and are intolerable. She continues to smoke about 1 pack of cigarettes per day. She is avoiding anti-inflammatory medications. She has been on PPIs chronically. She underwent upper endoscopy by Dr. Laural Benes June 2016 with findings of a large amount of retained liquids and solids in the gastric pouch with Billroth II surgical anastomosis severely stenosed unable to pass 9 mm gastroscope. Previous endoscopy September 2013 showed a large deep marginal ulcer at the gastrojejunostomy.   Other Problems [name], [name]; 09/06/2014 11:17 AM) Arthritis Back Pain Gastric Ulcer Gastroesophageal Reflux Disease Migraine Headache Oophorectomy Left. Thyroid Disease Transfusion history  Past Surgical History [name], [name]; 09/06/2014 11:17 AM) Hysterectomy (not due to cancer) - Complete Hysterectomy (not due to  cancer) - Partial Resection of Stomach Thyroid Surgery  Diagnostic Studies History [name], [name]; 09/06/2014 11:17 AM) Colonoscopy never Mammogram within last year Pap Smear 1-5 years ago  Allergies [name], [name]; 09/06/2014 11:18 AM) No Known Drug Allergies07/28/2016  Medication History [name], [name]; 09/06/2014 11:19 AM) Hydrocodone-Acetaminophen (5-325MG  Tablet, Oral as needed) Active. Levothyroxine Sodium ( Tablet, Oral) Active. TraMADol HCl (50MG  Tablet, Oral as needed) Active. NexIUM (20MG  Packet, Oral) Active. Multiple Vitamin (Oral) Active. Medications Reconciled  Social History [name], [place]; 09/06/2014 11:17 AM) Alcohol use Occasional alcohol use. Caffeine use Coffee. No drug use Tobacco use Current every day smoker.  Family History [name], [place]; 09/06/2014 11:17 AM) Alcohol Abuse Mother. Arthritis Daughter, Sister. Colon Polyps Daughter. Depression Daughter. Heart disease in female family member before age 77 Migraine Headache Sister. Respiratory Condition Sister.  Pregnancy / Birth History [name], [name]; 09/06/2014 11:18 AM) Age at menarche 9 years. Age of menopause <43 Gravida 51 Maternal age 58-25 Para 4  Review of Systems [name] [name]; 09/06/2014 11:18 AM) General Present- Weight Loss. Not Present- Appetite Loss, Chills, Fatigue, Fever, Night Sweats and Weight Gain. Skin Not Present- Change in Wart/Mole, Dryness, Hives, Jaundice, New Lesions, Non-Healing Wounds, Rash and Ulcer. HEENT Present- Seasonal Allergies and Sinus Pain. Not Present- Earache, Hearing Loss, Hoarseness, Nose Bleed, Oral Ulcers, Ringing in the Ears, Sore Throat, Visual Disturbances, Wears glasses/contact lenses and Yellow Eyes. Respiratory Present- Wheezing. Not Present- Bloody sputum, Chronic Cough, Difficulty Breathing and Snoring. Breast Not Present- Breast Mass, Breast Pain, Nipple Discharge and Skin Changes. Cardiovascular  Not Present- Chest Pain, Difficulty Breathing Lying Down, Leg Cramps, Palpitations, Rapid Heart Rate, Shortness of Breath and Swelling of Extremities. Gastrointestinal Present- Bloating, Constipation, Difficulty Swallowing, Excessive  gas, Gets full quickly at meals and Nausea. Not Present- Abdominal Pain, Bloody Stool, Change in Bowel Habits, Chronic diarrhea, Hemorrhoids, Indigestion, Rectal Pain and Vomiting. Female Genitourinary Not Present- Frequency, Nocturia, Painful Urination, Pelvic Pain and Urgency. Musculoskeletal Present- Back Pain, Joint Pain, Joint Stiffness, Muscle Pain, Muscle Weakness and Swelling of Extremities. Neurological Present- Headaches. Not Present- Decreased Memory, Fainting, Numbness, Seizures, Tingling, Tremor, Trouble walking and Weakness. Psychiatric Not Present- Anxiety, Bipolar, Change in Sleep Pattern, Depression, Fearful and Frequent crying. Endocrine Present- Hot flashes. Not Present- Cold Intolerance, Excessive Hunger, Hair Changes, Heat Intolerance and New Diabetes. Hematology Not Present- Easy Bruising, Excessive bleeding, Gland problems, HIV and Persistent Infections.   Vitals [name] [name]; 09/06/2014 11:19 AM) 09/06/2014 11:19 AM Weight: 87.6 lb Height: 62in Body Surface Area: 1.32 m Body Mass Index: 16.02 kg/m Temp.: 98.70F(Oral)  Pulse: 52 (Regular)  BP: 116/60 (Sitting, Left Arm, Standard)    Physical Exam Sheila Salina T. [name]; 09/06/2014 11:48 AM) The physical exam findings are as follows: Note:General: Alert, very thin Caucasian female, in no distress Skin: Warm and dry without rash or infection. HEENT: Well-healed thyroidectomy scar without masses. Sclera nonicteric. Pupils equal round and reactive. Oropharynx clear. Lymph nodes: No cervical, supraclavicular, or inguinal nodes palpable. Lungs: Breath sounds somewhat distant but clear and equal. No wheezing or increased work of breathing. Cardiovascular: Regular rate and  rhythm without murmer. No JVD or edema. Peripheral pulses intact. No carotid bruits. Abdomen: Mild upper abdominal distention. Very thin. Palpable hard stool right side of abdomen. Soft and nontender. No masses palpable. No organomegaly. No palpable hernias. Extremities: No edema or joint swelling or deformity. No chronic venous stasis changes. Neurologic: Alert and fully oriented. Gait normal. No focal weakness. Psychiatric: Normal mood and affect. Thought content appropriate with normal judgement and insight    Assessment & Plan Sheila Salina T. 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 It sounds like she has had some ongoing problems with gastric emptying. This has progressively worsened. She has had documented marginal ulcer in 2013 and now has high-grade stenosis at her gastrojejunostomy. She has continued to smoke. No current NSAID use. This will require surgical correction. I would recommend a generous antrectomy in order to try to prevent further ulcers with resection and redo of her gastrojejunostomy. We discussed that ongoing cigarette use could contribute to future ulceration and outlet obstruction. We discussed the nature of surgery and its indications. Discussed risks of anesthetic complications, pulmonary complications, bleeding, infection or anastomotic leak. Her COPD appears mild and stable. As she is very thin and somewhat malnourished I recommended placement of a feeding jejunostomy tube at the time of surgery for nutritional support as she is recovering. She understands and desires to proceed. Current Plans  Schedule for Surgery Antrectomy and gastrojejunostomy with placement of feeding jejunostomy

## 2014-09-24 NOTE — Progress Notes (Signed)
09-24-14 - Talked to [facility] (Triage) at [facility] (Dr. Johna Sheriff) regarding a bowel prep for pt.  [name] stated she talked to Dr. Jamse Mead [name] and pt. Does not need a bowel prep for surgery on 09-25-14

## 2014-09-24 NOTE — Progress Notes (Addendum)
07-05-14 - DG ABD Acute w/ 1V Chest - EPIC 06-19-14 - DG UGI w/small bowel - EPIC 06-19-14 - DG ABD 1V - EPIC 11-30-13 - LOV - Dr. Maple Hudson (pulmon) - EPIC 11-30-13 - CXR - EPIC

## 2014-09-25 ENCOUNTER — Encounter (HOSPITAL_COMMUNITY): Payer: Self-pay | Admitting: *Deleted

## 2014-09-25 ENCOUNTER — Inpatient Hospital Stay (HOSPITAL_COMMUNITY): Payer: Medicare Other | Admitting: Certified Registered Nurse Anesthetist

## 2014-09-25 ENCOUNTER — Encounter (HOSPITAL_COMMUNITY)
Admission: RE | Disposition: A | Discharge status: Home / Self Care | Payer: Self-pay | Source: Ambulatory Visit | Attending: General Surgery

## 2014-09-25 ENCOUNTER — Inpatient Hospital Stay (HOSPITAL_COMMUNITY)
Admission: RE | Admit: 2014-09-25 | Discharge: 2014-10-03 | DRG: 326 | Disposition: A | Discharge status: Home / Self Care | Payer: Medicare Other | Source: Ambulatory Visit | Attending: General Surgery | Admitting: General Surgery

## 2014-09-25 DIAGNOSIS — G43909 Migraine, unspecified, not intractable, without status migrainosus: Secondary | ICD-10-CM | POA: Diagnosis present

## 2014-09-25 DIAGNOSIS — J449 Chronic obstructive pulmonary disease, unspecified: Secondary | ICD-10-CM | POA: Diagnosis present

## 2014-09-25 DIAGNOSIS — E079 Disorder of thyroid, unspecified: Secondary | ICD-10-CM | POA: Diagnosis present

## 2014-09-25 DIAGNOSIS — K567 Ileus, unspecified: Secondary | ICD-10-CM | POA: Diagnosis not present

## 2014-09-25 DIAGNOSIS — K289 Gastrojejunal ulcer, unspecified as acute or chronic, without hemorrhage or perforation: Secondary | ICD-10-CM | POA: Diagnosis present

## 2014-09-25 DIAGNOSIS — Z79891 Long term (current) use of opiate analgesic: Secondary | ICD-10-CM

## 2014-09-25 DIAGNOSIS — E43 Unspecified severe protein-calorie malnutrition: Secondary | ICD-10-CM | POA: Insufficient documentation

## 2014-09-25 DIAGNOSIS — E46 Unspecified protein-calorie malnutrition: Secondary | ICD-10-CM | POA: Diagnosis present

## 2014-09-25 DIAGNOSIS — Z01812 Encounter for preprocedural laboratory examination: Secondary | ICD-10-CM

## 2014-09-25 DIAGNOSIS — F17210 Nicotine dependence, cigarettes, uncomplicated: Secondary | ICD-10-CM | POA: Diagnosis present

## 2014-09-25 DIAGNOSIS — K219 Gastro-esophageal reflux disease without esophagitis: Secondary | ICD-10-CM | POA: Diagnosis present

## 2014-09-25 DIAGNOSIS — Z8711 Personal history of peptic ulcer disease: Secondary | ICD-10-CM | POA: Diagnosis not present

## 2014-09-25 DIAGNOSIS — M1990 Unspecified osteoarthritis, unspecified site: Secondary | ICD-10-CM | POA: Diagnosis present

## 2014-09-25 DIAGNOSIS — D62 Acute posthemorrhagic anemia: Secondary | ICD-10-CM | POA: Diagnosis not present

## 2014-09-25 DIAGNOSIS — Z79899 Other long term (current) drug therapy: Secondary | ICD-10-CM | POA: Diagnosis not present

## 2014-09-25 DIAGNOSIS — K311 Adult hypertrophic pyloric stenosis: Principal | ICD-10-CM | POA: Diagnosis present

## 2014-09-25 HISTORY — PX: PARTIAL GASTRECTOMY: SHX6003

## 2014-09-25 SURGERY — GASTRECTOMY, PARTIAL
Anesthesia: General | Site: Abdomen

## 2014-09-25 MED ORDER — FENTANYL CITRATE (PF) 100 MCG/2ML IJ SOLN
INTRAMUSCULAR | Status: DC | PRN
  Administered 2014-09-25: 100 ug via INTRAVENOUS
  Administered 2014-09-25 (×5): 50 ug via INTRAVENOUS

## 2014-09-25 MED ORDER — GLYCOPYRROLATE 0.2 MG/ML IJ SOLN
INTRAMUSCULAR | Status: DC | PRN
  Administered 2014-09-25 (×2): 0.2 mg via INTRAVENOUS

## 2014-09-25 MED ORDER — ONDANSETRON HCL 4 MG/2ML IJ SOLN
4.0000 mg | Freq: Four times a day (QID) | INTRAMUSCULAR | Status: DC | PRN
  Administered 2014-09-25: 4 mg via INTRAVENOUS
  Filled 2014-09-25 (×2): qty 2

## 2014-09-25 MED ORDER — GLYCOPYRROLATE 0.2 MG/ML IJ SOLN
INTRAMUSCULAR | Status: AC
  Filled 2014-09-25: qty 1

## 2014-09-25 MED ORDER — HYDROMORPHONE HCL 2 MG/ML IJ SOLN
INTRAMUSCULAR | Status: AC
  Filled 2014-09-25: qty 1

## 2014-09-25 MED ORDER — NEOSTIGMINE METHYLSULFATE 10 MG/10ML IV SOLN
INTRAVENOUS | Status: DC | PRN
  Administered 2014-09-25: 2 mg via INTRAVENOUS

## 2014-09-25 MED ORDER — ACETAMINOPHEN 10 MG/ML IV SOLN
1000.0000 mg | Freq: Four times a day (QID) | INTRAVENOUS | Status: AC
  Administered 2014-09-25 – 2014-09-26 (×4): 1000 mg via INTRAVENOUS
  Filled 2014-09-25 (×4): qty 100

## 2014-09-25 MED ORDER — ONDANSETRON 4 MG PO TBDP
4.0000 mg | ORAL_TABLET | Freq: Four times a day (QID) | ORAL | Status: DC | PRN
Start: 2014-09-25 — End: 2014-10-03
  Administered 2014-09-26: 4 mg via ORAL
  Filled 2014-09-25 (×2): qty 1

## 2014-09-25 MED ORDER — KCL IN DEXTROSE-NACL 20-5-0.9 MEQ/L-%-% IV SOLN
INTRAVENOUS | Status: DC
  Administered 2014-09-25 – 2014-09-27 (×4): via INTRAVENOUS
  Administered 2014-09-27 – 2014-09-28 (×2): 100 mL/h via INTRAVENOUS
  Administered 2014-09-29: 75 mL/h via INTRAVENOUS
  Administered 2014-09-30 – 2014-10-01 (×4): via INTRAVENOUS
  Administered 2014-10-02: 1000 mL via INTRAVENOUS
  Filled 2014-09-25 (×17): qty 1000

## 2014-09-25 MED ORDER — MEPERIDINE HCL 50 MG/ML IJ SOLN: 6.2500 mg | INTRAMUSCULAR | Status: DC | PRN

## 2014-09-25 MED ORDER — PROMETHAZINE HCL 25 MG/ML IJ SOLN: 6.2500 mg | INTRAMUSCULAR | Status: DC | PRN

## 2014-09-25 MED ORDER — HYDROMORPHONE HCL 1 MG/ML IJ SOLN
INTRAMUSCULAR | Status: DC | PRN
  Administered 2014-09-25 (×2): .2 mg via INTRAVENOUS

## 2014-09-25 MED ORDER — FENTANYL CITRATE (PF) 100 MCG/2ML IJ SOLN
25.0000 ug | INTRAMUSCULAR | Status: DC | PRN
  Administered 2014-09-25: 25 ug via INTRAVENOUS

## 2014-09-25 MED ORDER — 0.9 % SODIUM CHLORIDE (POUR BTL) OPTIME
TOPICAL | Status: DC | PRN
  Administered 2014-09-25: 5000 mL

## 2014-09-25 MED ORDER — FENTANYL CITRATE (PF) 100 MCG/2ML IJ SOLN
INTRAMUSCULAR | Status: AC
  Filled 2014-09-25: qty 2

## 2014-09-25 MED ORDER — ROCURONIUM BROMIDE 100 MG/10ML IV SOLN
INTRAVENOUS | Status: DC | PRN
  Administered 2014-09-25 (×3): 10 mg via INTRAVENOUS
  Administered 2014-09-25: 30 mg via INTRAVENOUS

## 2014-09-25 MED ORDER — PANTOPRAZOLE SODIUM 40 MG IV SOLR
40.0000 mg | Freq: Every day | INTRAVENOUS | Status: DC
  Administered 2014-09-25 – 2014-10-02 (×8): 40 mg via INTRAVENOUS
  Filled 2014-09-25 (×9): qty 40

## 2014-09-25 MED ORDER — ONDANSETRON HCL 4 MG/2ML IJ SOLN
INTRAMUSCULAR | Status: DC | PRN
  Administered 2014-09-25: 4 mg via INTRAVENOUS

## 2014-09-25 MED ORDER — TISSEEL VH 10 ML EX KIT
PACK | CUTANEOUS | Status: AC
  Filled 2014-09-25: qty 1

## 2014-09-25 MED ORDER — MORPHINE SULFATE (PF) 2 MG/ML IV SOLN
2.0000 mg | INTRAVENOUS | Status: DC | PRN
  Administered 2014-09-25 – 2014-09-26 (×3): 2 mg via INTRAVENOUS
  Administered 2014-09-26: 4 mg via INTRAVENOUS
  Administered 2014-09-26 (×3): 2 mg via INTRAVENOUS
  Administered 2014-09-26: 6 mg via INTRAVENOUS
  Administered 2014-10-02: 2 mg via INTRAVENOUS
  Filled 2014-09-25: qty 3
  Filled 2014-09-25 (×3): qty 1
  Filled 2014-09-25: qty 2
  Filled 2014-09-25 (×4): qty 1

## 2014-09-25 MED ORDER — ONDANSETRON HCL 4 MG/2ML IJ SOLN
INTRAMUSCULAR | Status: AC
  Filled 2014-09-25: qty 2

## 2014-09-25 MED ORDER — ENOXAPARIN SODIUM 30 MG/0.3ML ~~LOC~~ SOLN
30.0000 mg | SUBCUTANEOUS | Status: DC
  Administered 2014-09-26 – 2014-10-02 (×7): 30 mg via SUBCUTANEOUS
  Filled 2014-09-25 (×9): qty 0.3

## 2014-09-25 MED ORDER — FENTANYL CITRATE (PF) 250 MCG/5ML IJ SOLN
INTRAMUSCULAR | Status: AC
  Filled 2014-09-25: qty 25

## 2014-09-25 MED ORDER — CHLORHEXIDINE GLUCONATE 4 % EX LIQD: 1.0000 "application " | Freq: Once | CUTANEOUS | Status: DC

## 2014-09-25 MED ORDER — LIDOCAINE HCL (CARDIAC) 20 MG/ML IV SOLN
INTRAVENOUS | Status: DC | PRN
  Administered 2014-09-25: 50 mg via INTRAVENOUS

## 2014-09-25 MED ORDER — PROPOFOL 10 MG/ML IV BOLUS
INTRAVENOUS | Status: AC
  Filled 2014-09-25: qty 20

## 2014-09-25 MED ORDER — LACTATED RINGERS IV SOLN
INTRAVENOUS | Status: DC
  Administered 2014-09-25: 14:00:00 via INTRAVENOUS
  Administered 2014-09-25: 1000 mL via INTRAVENOUS
  Administered 2014-09-25: 14:00:00 via INTRAVENOUS

## 2014-09-25 MED ORDER — VANCOMYCIN HCL IN DEXTROSE 1-5 GM/200ML-% IV SOLN
1000.0000 mg | INTRAVENOUS | Status: AC
  Administered 2014-09-25: 1000 mg via INTRAVENOUS
  Filled 2014-09-25: qty 200

## 2014-09-25 MED ORDER — FENTANYL CITRATE (PF) 100 MCG/2ML IJ SOLN
INTRAMUSCULAR | Status: AC
  Filled 2014-09-25: qty 4

## 2014-09-25 MED ORDER — EPHEDRINE SULFATE 50 MG/ML IJ SOLN
INTRAMUSCULAR | Status: DC | PRN
  Administered 2014-09-25 (×3): 10 mg via INTRAVENOUS
  Administered 2014-09-25: 5 mg via INTRAVENOUS

## 2014-09-25 MED ORDER — TISSEEL VH 10 ML EX KIT
PACK | CUTANEOUS | Status: DC | PRN
  Administered 2014-09-25: 10 mL

## 2014-09-25 MED ORDER — PROPOFOL 10 MG/ML IV BOLUS
INTRAVENOUS | Status: DC | PRN
  Administered 2014-09-25: 30 mg via INTRAVENOUS
  Administered 2014-09-25: 70 mg via INTRAVENOUS

## 2014-09-25 MED ORDER — NEOSTIGMINE METHYLSULFATE 10 MG/10ML IV SOLN
INTRAVENOUS | Status: AC
  Filled 2014-09-25: qty 1

## 2014-09-25 SURGICAL SUPPLY — 54 items
APPLICATOR COTTON TIP 6IN STRL (MISCELLANEOUS) IMPLANT
ATTRACTOMAT 16X20 MAGNETIC DRP (DRAPES) IMPLANT
BLADE EXTENDED COATED 6.5IN (ELECTRODE) ×2 IMPLANT
BLADE HEX COATED 2.75 (ELECTRODE) ×2 IMPLANT
BLADE SURG SZ10 CARB STEEL (BLADE) IMPLANT
CATH ROBINSON RED A/P 12FR (CATHETERS) ×2 IMPLANT
CLIP TI LARGE 6 (CLIP) IMPLANT
COUNTER NEEDLE 20 DBL MAG RED (NEEDLE) ×2 IMPLANT
COVER SURGICAL LIGHT HANDLE (MISCELLANEOUS) ×2 IMPLANT
DRAIN CHANNEL RND F F (WOUND CARE) IMPLANT
DRAIN PENROSE 1X36 LTX NONSTRL (WOUND CARE) IMPLANT
DRAPE INCISE IOBAN 66X45 STRL (DRAPES) IMPLANT
DRAPE LAPAROSCOPIC ABDOMINAL (DRAPES) ×2 IMPLANT
ELECT REM PT RETURN 9FT ADLT (ELECTROSURGICAL)
ELECTRODE REM PT RTRN 9FT ADLT (ELECTROSURGICAL) IMPLANT
EVACUATOR SILICONE 100CC (DRAIN) IMPLANT
GAUZE SPONGE 4X4 12PLY STRL (GAUZE/BANDAGES/DRESSINGS) ×2 IMPLANT
GAUZE SPONGE 4X4 16PLY XRAY LF (GAUZE/BANDAGES/DRESSINGS) IMPLANT
GLOVE BIOGEL PI IND STRL 7.0 (GLOVE) ×1 IMPLANT
GLOVE BIOGEL PI INDICATOR 7.0 (GLOVE) ×1
GOWN STRL REUS W/TWL LRG LVL3 (GOWN DISPOSABLE) ×2 IMPLANT
GOWN STRL REUS W/TWL XL LVL3 (GOWN DISPOSABLE) ×4 IMPLANT
KIT BASIN OR (CUSTOM PROCEDURE TRAY) ×2 IMPLANT
NS IRRIG 1000ML POUR BTL (IV SOLUTION) ×4 IMPLANT
PACK GENERAL/GYN (CUSTOM PROCEDURE TRAY) ×2 IMPLANT
PEN SKIN MARKING BROAD (MISCELLANEOUS) IMPLANT
PLUG CATH AND CAP STER (CATHETERS) ×2 IMPLANT
RELOAD AUTO 90-4.8 TA90 GRN (ENDOMECHANICALS) ×2 IMPLANT
RELOAD PROXIMATE 75MM BLUE (ENDOMECHANICALS) ×4 IMPLANT
REMOVER STAPLE SKIN (DISPOSABLE) ×2 IMPLANT
SHEARS HARMONIC ACE PLUS 36CM (ENDOMECHANICALS) IMPLANT
SPONGE LAP 18X18 X RAY DECT (DISPOSABLE) ×4 IMPLANT
SPONGE LAP 4X18 X RAY DECT (DISPOSABLE) IMPLANT
STAPLER 90 3.5 STAND SLIM (STAPLE) ×2
STAPLER 90 3.5 STD SLIM (STAPLE) ×1 IMPLANT
STAPLER PROXIMATE 75MM BLUE (STAPLE) ×2 IMPLANT
STAPLER VISISTAT 35W (STAPLE) ×2 IMPLANT
SUCTION POOLE TIP (SUCTIONS) ×2 IMPLANT
SUT CHROMIC 2 0 SH (SUTURE) ×6 IMPLANT
SUT CHROMIC 3 0 SH 27 (SUTURE) ×2 IMPLANT
SUT ETHILON 3 0 PS 1 (SUTURE) ×2 IMPLANT
SUT NOVA NAB GS-21 0 18 T12 DT (SUTURE) ×2 IMPLANT
SUT PDS AB 0 CT1 36 (SUTURE) ×4 IMPLANT
SUT SILK 2 0 (SUTURE) ×3
SUT SILK 2 0 SH CR/8 (SUTURE) ×16 IMPLANT
SUT SILK 2-0 18XBRD TIE 12 (SUTURE) ×3 IMPLANT
SUT SILK 3 0 (SUTURE) ×1
SUT SILK 3 0 SH CR/8 (SUTURE) ×2 IMPLANT
SUT SILK 3-0 18XBRD TIE 12 (SUTURE) ×1 IMPLANT
TAPE CLOTH SURG 4X10 WHT LF (GAUZE/BANDAGES/DRESSINGS) ×2 IMPLANT
TOWEL OR 17X26 10 PK STRL BLUE (TOWEL DISPOSABLE) ×2 IMPLANT
TRAY FOLEY W/METER SILVER 14FR (SET/KITS/TRAYS/PACK) ×2 IMPLANT
TRAY FOLEY W/METER SILVER 16FR (SET/KITS/TRAYS/PACK) IMPLANT
YANKAUER SUCT BULB TIP NO VENT (SUCTIONS) ×2 IMPLANT

## 2014-09-25 NOTE — Anesthesia Procedure Notes (Signed)
Procedure Name: Intubation Performed by: Vana Arif J Pre-anesthesia Checklist: Patient identified, Emergency Drugs available, Suction available, Patient being monitored and Timeout performed Patient Re-evaluated:Patient Re-evaluated prior to inductionOxygen Delivery Method: Circle system utilized Preoxygenation: Pre-oxygenation with 100% oxygen Intubation Type: IV induction Ventilation: Mask ventilation without difficulty Laryngoscope Size: Mac and 3 Grade View: Grade I Tube type: Oral Tube size: 7.0 mm Number of attempts: 1 Airway Equipment and Method: Stylet Placement Confirmation: ETT inserted through vocal cords under direct vision,  positive ETCO2,  CO2 detector and breath sounds checked- equal and bilateral Secured at: 21 cm Tube secured with: Tape Dental Injury: Teeth and Oropharynx as per pre-operative assessment        

## 2014-09-25 NOTE — Progress Notes (Signed)
Patient easily aroused- vital signs stable

## 2014-09-25 NOTE — Op Note (Signed)
Preoperative Diagnosis: gastric outlet obstruction  Postoprative Diagnosis: gastric outlet obstruction  Procedure: Procedure(s): PARTIAL GASTRECTOMY WITH RESECTION OF GASTROJEJUNOSTOMY, ROUX-EN-Y RECONSTRUCTION, PLACEMENT OF JEJUNOSTOMY FEEDING TUBE   Surgeon: Mariella Saa   Assistants: Viviann Spare gross  Anesthesia:  General endotracheal anesthesia  Indications: patient is a 69 year old female with a history of laparoscopic truncal vagotomy and gastrojejunostomy in 2005 for peptic ulcer disease and gastric outlet obstruction. She subsequently developed a marginal ulcer which was treated but she has progressed to stenosis of her gastrojejunostomy and gastric outlet obstruction. She has progressive weight loss and wasting. After extensive preoperative discussion detailed elsewhere we have elected to proceed with gastrectomy and Roux-en-Y reconstruction with placement of a jejunal feeding tube    Procedure Detail:  Patient was brought to the operating room, placed in the supine position on the operating table, and general endotracheal anesthesia induced. She had received preoperative IV antibiotics. PAS were in place. The abdomen was widely sterilely prepped and draped. Patient timeout was performed and correct procedure verified. An upper midline incision skirting the umbilicus was used and I secured at the simultaneous tissue and midline fascia the peritoneum entered under direct vision. The thoracic exploration was performed. The stomach was very enlarged and dilated throughout. The previous gastrojejunostomy was noted and appeared to be just beyond the proximal third of the stomach. The affarent and efferent limbs were identified and examined and appeared normal. The transverse colon was markedly elongated with a lot of hard stool throughout the colon. The distal stomach was mobilized in preparation for resection. The lesser omentum was opened and the greater curve of the stomach dissected  beginning just proximal to the previous gastrojejunostomy and working distal to the pylorus staying just along the greater curve and avoiding the transverse colon mesentery until the greater curve was lately freed down to below the pylorus dividing vessels between clamps and tied with 2-0 silk ties. The point of resection along the lesser curve was chosen and the gastrohepatic ligament opened and the lesser curve vessels dissected off the stomach at this point divided and ligated. The stomach was then divided just above the level of the previous gastrojejunostomy which was about a two thirds gastrectomy, using a single firing of the TA 90 green load stapler. The 2 small bowel limbs were divided with GIA staplers just beyond the anastomosis. The specimen was completely free at this point down to the duodenum which felt soft and normal. I elected to continue with the Roux-en-Y reconstruction as the amount of stomach resected I felt would create some tension with a Billroth I anastomosis. The duodenum was divided with a single firing of the TA 90 stapler with a blue load and the specimen removed.  The biliopancreatic and efferent limbs were mobilized from each other somewhat dividing the mesentery short distance. The Roux limb was then brought up to the gastric pouch with the stapled end facing toward the patient's left and a large anastomosis was created in 2 layers with a posterior row of interrupted 2-0 silk then excising the staple line of the gastric pouch and incising the Roux limb followed by a running full-thickness circumferential locking suture of 2-0 chromic followed by an anterior row of seromuscular interrupted silk. Prior to completing the anastomosis an NG tube was advanced through the anastomosis 5 or 6 cm into the Roux limb. It appeared widely patent with good blood supply. Following this the biliopancreatic limb was anastomosed to the Roux limb about 30 cm distal to the gastrojejunostomy  in a  side-to-side fashion with a single firing of the GIA 75 mm stapler closing the enterotomy in 2 layers with running 3-0 chromic and interrupted 30 silks. The abdomen was thoroughly irrigated and complete hemostasis assured. The mesenteric defect at the jejunojejunostomy was closed with interrupted silks. A jejunostomy feeding tube was constructed using a 16 French red rubber catheter placed through a pursestring suture in the jejunum about 10 cm distal to the jejunojejunostomy. The site was inverted with interrupted 3-0 silk Wetzel sutures for a couple of centimeters. The tube was brought out through a stab wound in the left flank and secured up to the anterior abdominal wall with interrupted 2-0 silk around the tube and down the limb of bowel for several centimeters to prevent twisting. The abdomen was again inspected and irrigated. The anastomoses were coated with Tisseel tissue sealant. The viscera returned to anatomic position. The midline fascia was closed with running #1 PDS beginning either in the incision tied centrally. Simultaneous tissue was irrigated and skin closed with staples. After I left the room it was noted to be some skin edge bleeding from the incision and I returned and under sterile technique redraping the wound several staples were removed and a subcutaneous bleeder cauterized and dissection of the wound closed with running 3-0 nylon. Sponge and needle instrument counts were correct.    Findings: As above  Estimated Blood Loss:  200 mL         Drains: none  Blood Given: none          Specimens: distal gastrectomy with gastrojejunostomy and portion of duodenum        Complications:  * No complications entered in OR log *         Disposition: PACU - hemodynamically stable.         Condition: stable

## 2014-09-25 NOTE — Interval H&P Note (Signed)
History and Physical Interval Note:  09/25/2014 11:22 AM  [name]  has presented today for surgery, with the diagnosis of gastric outlet obstruction  The various methods of treatment have been discussed with the patient and family. After consideration of risks, benefits and other options for treatment, the patient has consented to  Procedure(s): PARTIAL GASTRECTOMY PLACEMENT OF FEEDING TUBE (N/A) as a surgical intervention .  The patient's history has been reviewed, patient examined, no change in status, stable for surgery.  I have reviewed the patient's chart and labs.  Questions were answered to the patient's satisfaction.     Chela Sutphen T

## 2014-09-25 NOTE — Anesthesia Preprocedure Evaluation (Addendum)
Anesthesia Evaluation  Patient identified by MRN, date of birth, ID band Patient awake    Reviewed: Allergy & Precautions, NPO status , Patient's Chart, lab work & pertinent test results  Airway Mallampati: II  TM Distance: >3 FB Neck ROM: Full    Dental no notable dental hx. (+) Partial Lower, Poor Dentition   Pulmonary neg pulmonary ROS, COPDCurrent Smoker,  breath sounds clear to auscultation  Pulmonary exam normal       Cardiovascular negative cardio ROS Normal cardiovascular examRhythm:Regular Rate:Normal     Neuro/Psych negative neurological ROS  negative psych ROS   GI/Hepatic negative GI ROS, Neg liver ROS,   Endo/Other    Renal/GU negative Renal ROS  negative genitourinary   Musculoskeletal negative musculoskeletal ROS (+)   Abdominal   Peds negative pediatric ROS (+)  Hematology negative hematology ROS (+)   Anesthesia Other Findings   Reproductive/Obstetrics negative OB ROS                            Anesthesia Physical Anesthesia Plan  ASA: III  Anesthesia Plan: General   Post-op Pain Management:    Induction: Rapid sequence, Cricoid pressure planned and Intravenous  Airway Management Planned: Oral ETT  Additional Equipment:   Intra-op Plan:   Post-operative Plan: Extubation in OR  Informed Consent: I have reviewed the patients History and Physical, chart, labs and discussed the procedure including the risks, benefits and alternatives for the proposed anesthesia with the patient or authorized representative who has indicated his/her understanding and acceptance.   Dental advisory given  Plan Discussed with: CRNA  Anesthesia Plan Comments:         Anesthesia Quick Evaluation

## 2014-09-25 NOTE — Progress Notes (Signed)
Dr. Johna Sheriff made aware of patient's vital signs and being very sleepy from receiving Fentanyl- IVP- orders given

## 2014-09-25 NOTE — Transfer of Care (Signed)
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

## 2014-09-25 NOTE — Progress Notes (Addendum)
Patient admitted to room 1224 from PACU. Patient drowsy on arrival to unit but easily aroused (oriented x4). Vitals signs stable. Surgical dressings clean/dry/intact, J-tube clamped, and NG tube connected to intermittent low wall suction. NG tube drained 200 ml of dark red output since pt arrival to unit. Dr Johna Sheriff at bedside and aware of NG tube output. Family at bedside and updated. Will continue to monitor.

## 2014-09-26 ENCOUNTER — Encounter (HOSPITAL_COMMUNITY): Payer: Self-pay | Admitting: General Surgery

## 2014-09-26 LAB — CBC
HCT: 30.9 % — ABNORMAL LOW (ref 36.0–46.0)
Hemoglobin: 10.4 g/dL — ABNORMAL LOW (ref 12.0–15.0)
MCH: 33.2 pg (ref 26.0–34.0)
MCHC: 33.7 g/dL (ref 30.0–36.0)
MCV: 98.7 fL (ref 78.0–100.0)
PLATELETS: 164 10*3/uL (ref 150–400)
RBC: 3.13 MIL/uL — AB (ref 3.87–5.11)
RDW: 13 % (ref 11.5–15.5)
WBC: 9.7 10*3/uL (ref 4.0–10.5)

## 2014-09-26 LAB — BASIC METABOLIC PANEL
Anion gap: 9 (ref 5–15)
BUN: 11 mg/dL (ref 6–20)
CO2: 23 mmol/L (ref 22–32)
CREATININE: 0.44 mg/dL (ref 0.44–1.00)
Calcium: 7.3 mg/dL — ABNORMAL LOW (ref 8.9–10.3)
Chloride: 102 mmol/L (ref 101–111)
GFR calc Af Amer: >60 mL/min (ref >60)
Glucose, Bld: 171 mg/dL — ABNORMAL HIGH (ref 65–99)
POTASSIUM: 4 mmol/L (ref 3.5–5.1)
SODIUM: 134 mmol/L — AB (ref 135–145)

## 2014-09-26 LAB — GLUCOSE, CAPILLARY: Glucose-Capillary: 98 mg/dL (ref 65–99)

## 2014-09-26 MED ORDER — HYDROMORPHONE HCL 1 MG/ML IJ SOLN
1.0000 mg | INTRAMUSCULAR | Status: DC | PRN
  Administered 2014-09-26 – 2014-10-03 (×54): 1 mg via INTRAVENOUS
  Filled 2014-09-26 (×55): qty 1

## 2014-09-26 MED ORDER — VITAL AF 1.2 CAL PO LIQD
1000.0000 mL | ORAL | Status: DC
  Administered 2014-09-26 – 2014-09-27 (×2): 1000 mL
  Filled 2014-09-26 (×4): qty 1000

## 2014-09-26 NOTE — Anesthesia Postprocedure Evaluation (Signed)
  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

## 2014-09-26 NOTE — Progress Notes (Signed)
Patient ID: [name], female   DOB: July 11, 1945, 69 y.o.   MRN: [id number] 1 Day Post-Op  Subjective: Alert, reasonably comfortable.  No SOB  Objective: Vital signs in last 24 hours: Temp:  [96.1 F (35.6 C)-98.7 F (37.1 C)] 98.7 F (37.1 C) (08/17 0400) Pulse Rate:  [59-83] 76 (08/17 0500) Resp:  [12-21] 16 (08/17 0500) BP: (83-155)/(32-78) 147/43 mmHg (08/17 0500) SpO2:  [92 %-100 %] 100 % (08/17 0500) Weight:  [38.3 kg (84 lb 7 oz)-38.556 kg (85 lb)] 38.3 kg (84 lb 7 oz) (08/17 0000)    Intake/Output from previous day: 08/16 0701 - 08/17 0700 In: 4100 [I.V.:3900; IV Piggyback:200] Out: 1210 [Urine:660; Emesis/NG output:300; Blood:250] Intake/Output this shift:    General appearance: alert, cooperative and no distress Resp: clear to auscultation bilaterally GI: normal findings: soft, non-tender Incision/Wound: Dressing clean and dry  Lab Results:   Recent Labs  09/24/14 1105 09/26/14 0345  WBC 4.1 9.7  HGB 12.9 10.4*  HCT 40.1 30.9*  PLT 168 164   BMET  Recent Labs  09/24/14 1105 09/26/14 0345  NA 134* 134*  K 3.9 4.0  CL 101 102  CO2 25 23  GLUCOSE 76 171*  BUN 16 11  CREATININE 0.59 0.44  CALCIUM 8.9 7.3*     Studies/Results: No results found.  Anti-infectives: Anti-infectives    Start     Dose/Rate Route Frequency Ordered Stop   09/25/14 0809  vancomycin (VANCOCIN) IVPB 1000 mg/200 mL premix     1,000 mg 200 mL/hr over 60 Minutes Intravenous On call to O.R. 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

## 2014-09-26 NOTE — Care Management Note (Signed)
Case Management Note  Patient Details  Name: [name] MRN: [id number] Date of Birth: [birth year]  Subjective/Objective:                 Hypotension s/p surgical procedure   Action/Plan:Date:  September 26, 2014 U.R. performed for needs and level of care. Will continue to follow for Case Management needs.  Marcelle Smiling, RN, BSN, [place]   [phone]   Expected Discharge Date:                  Expected Discharge Plan:  Home/Self Care  In-House Referral:  NA  Discharge planning Services  CM Consult  Post Acute Care Choice:  NA Choice offered to:  NA  DME Arranged:  N/A DME Agency:  NA  HH Arranged:  NA HH Agency:  NA  Status of Service:  In process, will continue to follow  Medicare Important Message Given:    Date Medicare IM Given:    Medicare IM give by:    Date Additional Medicare IM Given:    Additional Medicare Important Message give by:     If discussed at Long Length of Stay Meetings, dates discussed:    Additional Comments:  [name], RN 09/26/2014, 10:31 AM

## 2014-09-27 LAB — CBC
HCT: 26.8 % — ABNORMAL LOW (ref 36.0–46.0)
HEMOGLOBIN: 8.8 g/dL — AB (ref 12.0–15.0)
MCH: 32.6 pg (ref 26.0–34.0)
MCHC: 32.8 g/dL (ref 30.0–36.0)
MCV: 99.3 fL (ref 78.0–100.0)
PLATELETS: 144 10*3/uL — AB (ref 150–400)
RBC: 2.7 MIL/uL — AB (ref 3.87–5.11)
RDW: 13.3 % (ref 11.5–15.5)
WBC: 6.4 10*3/uL (ref 4.0–10.5)

## 2014-09-27 LAB — BASIC METABOLIC PANEL
Anion gap: 6 (ref 5–15)
BUN: 10 mg/dL (ref 6–20)
CHLORIDE: 101 mmol/L (ref 101–111)
CO2: 27 mmol/L (ref 22–32)
Calcium: 7.3 mg/dL — ABNORMAL LOW (ref 8.9–10.3)
Creatinine, Ser: 0.39 mg/dL — ABNORMAL LOW (ref 0.44–1.00)
GFR calc Af Amer: >60 mL/min (ref >60)
GFR calc non Af Amer: >60 mL/min (ref >60)
GLUCOSE: 127 mg/dL — AB (ref 65–99)
POTASSIUM: 3.7 mmol/L (ref 3.5–5.1)
Sodium: 134 mmol/L — ABNORMAL LOW (ref 135–145)

## 2014-09-27 LAB — MRSA PCR SCREENING: MRSA by PCR: NEGATIVE

## 2014-09-27 MED ORDER — CETYLPYRIDINIUM CHLORIDE 0.05 % MT LIQD
7.0000 mL | Freq: Two times a day (BID) | OROMUCOSAL | Status: DC
  Administered 2014-09-27 – 2014-10-02 (×11): 7 mL via OROMUCOSAL

## 2014-09-27 NOTE — Progress Notes (Signed)
Patient ID: [name], female   DOB: [birth year], 69 y.o.   MRN: [id number] 2 Days Post-Op  Subjective: Pain at incision with movement, otherwise comfortable without C/O  Objective: Vital signs in last 24 hours: Temp:  [97.9 F (36.6 C)-98.9 F (37.2 C)] 98.5 F (36.9 C) (08/18 0337) Pulse Rate:  [31-81] 75 (08/18 0500) Resp:  [8-22] 15 (08/18 0500) BP: (120-186)/(47-95) 147/88 mmHg (08/18 0500) SpO2:  [88 %-100 %] 100 % (08/18 0500)    Intake/Output from previous day: 08/17 0701 - 08/18 0700 In: 2530 [I.V.:2200; NG/GT:190; IV Piggyback:100] Out: 1300 [Urine:1200; Emesis/NG output:100] Intake/Output this shift: Total I/O In: 1040 [I.V.:900; Other:20; NG/GT:120] Out: 600 [Urine:600]  General appearance: alert, cooperative and no distress Resp: clear to auscultation bilaterally GI: normal findings: soft, non-tender and mild distention Incision/Wound: Dressing clean and dry  Lab Results:   Recent Labs  09/26/14 0345 09/27/14 0343  WBC 9.7 6.4  HGB 10.4* 8.8*  HCT 30.9* 26.8*  PLT 164 144*   BMET  Recent Labs  09/26/14 0345 09/27/14 0343  NA 134* 134*  K 4.0 3.7  CL 102 101  CO2 23 27  GLUCOSE 171* 127*  BUN 11 10  CREATININE 0.44 0.39*  CALCIUM 7.3* 7.3*     Studies/Results: No results found.  Anti-infectives: Anti-infectives    Start     Dose/Rate Route Frequency Ordered Stop   09/25/14 0809  vancomycin (VANCOCIN) IVPB 1000 mg/200 mL premix     1,000 mg 200 mL/hr over 60 Minutes Intravenous On call to O.R. 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  Expected ileus. Cont slow j tube feeds Blood loss anemia-follow Required I&O cath p Foley out Transfer to floor   LOS: 2 days    [name] T 09/27/2014

## 2014-09-27 NOTE — Progress Notes (Signed)
Initial Nutrition Assessment  DOCUMENTATION CODES:   Severe malnutrition in context of chronic illness, Underweight  INTERVENTION:  - Recommended goal: Vital AF 1.2 @ 45 mL/hr which will provide 1296 kcal, 81 grams protein, and 876 mL free water. - RD will continue to monitor for needs  NUTRITION DIAGNOSIS:   Altered GI function related to chronic illness as evidenced by other (see comment) (gastric outlet obstruction s/p surgery 8/16)  GOAL:   Patient will meet greater than or equal to 90% of their needs  MONITOR:   TF tolerance  REASON FOR ASSESSMENT:   Other (Comment) (new TF)  ASSESSMENT:   69 year old female who presents with a gastric outlet obstruction. She has a long history of problems with peptic ulcer disease and gastric emptying issues. Patient was diagnosed with peptic ulcer disease at least as early as the early 2000s. She has been a chronic cigarette smoker. Also arthritis and history of NSAID use. 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 In December of 2005 she underwent laparoscopic truncal vagotomy and gastrojejunostomy by Dr. Danna Hefty. The patient states that she had some temporary improvement in her symptoms but continued to have early satiety and bloating after meals. This has gradually worsened and become very significant over the last year or so. She feels she is unable to eat much at all or she will develop significant upper abdominal swelling and pressure, bloating and nausea and belching. She has to severely limit her diet and really have symptoms after any sort of oral intake.   Pt seen for new TF. BMI indicates underweight status. Pt is POD #2 partial gastrectomy with J-tube placement. NGT in place to suction; unable to visualize output at time of visit as RN and tech working to prepare pt for transfer out of unit.   Pt reports that for the past 1 year she has had very poor intakes related to chronic gastric  complications as outlined above from H&P. She states that she has "always been small" but that she has lost a few pounds over the past year. Per weight hx review, pt has lost 6 lbs (7% body weight) in the past 2 months which is significant for time frame.  Unable to do physical assessment at this time related to RN and tech working with pt and pt stating she is having difficulty concentrating with some much activity in her room; will complete on follow-up.  She is currently receiving Vital AF 1.2 @ 10 mL/hr via J-tube which is providing 288 kcal, 18 grams protein, and 195 mL free water which is not able to meet needs. Recommend TF as outlined above.   Medications reviewed. Labs reviewed; Na: 134 mmol/L, creatinine low, Ca: 7.3 mg/dL.    Diet Order:  Diet NPO time specified Except for: Ice Chips  Skin:  Wound (see comment) (abdominal surgical wound)  Last BM:  PTA  Height:   Ht Readings from Last 1 Encounters:  09/25/14  (1.575 m)    Weight:   Wt Readings from Last 1 Encounters:  09/26/14 84 lb 7 oz (38.3 kg)    Ideal Body Weight:  50 kg (kg)  BMI:  Body mass index is 15.44 kg/(m^2).  Estimated Nutritional Needs:   Kcal:  1150-1350  Protein:  57-70 grams  Fluid:  2.2 L/day  EDUCATION NEEDS:   No education needs identified at this time      Trenton Gammon, RD, LDN Inpatient Clinical Dietitian Pager # [phone] After  hours/weekend pager # [phone]

## 2014-09-27 NOTE — Progress Notes (Signed)
Patient's foley was removed around 1800 during previous shift. Patient had not voided since. Bladder scan was done and showed 563 mL of urine. On call MD was paged and notified. Was told to do in and out cath up to 2 attempts, if patient is still retaining after 2 attempt then call again for foley cath order. In and out cath was done and 600 mL was obtained.

## 2014-09-27 NOTE — Care Management Important Message (Signed)
Important Message  Patient Details  Name: [name] MRN: [id number] Date of Birth: May 01, 1945   Medicare Important Message Given:  [facility] notification given    Haskell Flirt 09/27/2014, 1:26 PMImportant Message  Patient Details  Name: [name] MRN: [id number] Date of Birth: April 06, 1945   Medicare Important Message Given:  Yes-second notification given    Haskell Flirt 09/27/2014, 1:26 PM

## 2014-09-28 LAB — CBC
HEMATOCRIT: 24.7 % — AB (ref 36.0–46.0)
Hemoglobin: 8 g/dL — ABNORMAL LOW (ref 12.0–15.0)
MCH: 32.1 pg (ref 26.0–34.0)
MCHC: 32.4 g/dL (ref 30.0–36.0)
MCV: 99.2 fL (ref 78.0–100.0)
Platelets: 124 10*3/uL — ABNORMAL LOW (ref 150–400)
RBC: 2.49 MIL/uL — AB (ref 3.87–5.11)
RDW: 12.9 % (ref 11.5–15.5)
WBC: 3.5 10*3/uL — AB (ref 4.0–10.5)

## 2014-09-28 MED ORDER — VITAL AF 1.2 CAL PO LIQD
1000.0000 mL | ORAL | Status: AC
  Administered 2014-09-28: 1000 mL
  Filled 2014-09-28: qty 1000

## 2014-09-28 MED ORDER — VITAL 1.5 CAL PO LIQD
1000.0000 mL | ORAL | Status: DC
  Administered 2014-09-28: 1000 mL
  Filled 2014-09-28 (×2): qty 1000

## 2014-09-28 NOTE — Progress Notes (Signed)
Patient ID: [name], female   DOB: 1945-06-12, 69 y.o.   MRN: [id number] 3 Days Post-Op  Subjective: Upset with nursing care yesterday, but better this AM.  Denies abd pain, nausea or SOB.  No flatus or BM yet.  Voiding OK.  Has walked in hall  Objective: Vital signs in last 24 hours: Temp:  [98.2 F (36.8 C)-98.8 F (37.1 C)] 98.4 F (36.9 C) (08/19 0550) Pulse Rate:  [70-96] 79 (08/19 0550) Resp:  [8-18] 15 (08/19 0550) BP: (116-177)/(52-64) 151/58 mmHg (08/19 0550) SpO2:  [90 %-100 %] 96 % (08/19 0550) Last BM Date:  (PTA)  Intake/Output from previous day: 08/18 0701 - 08/19 0700 In: 2722 [I.V.:2563.3; NG/GT:138.7] Out: 2925 [Urine:2725; Emesis/NG output:200] Intake/Output this shift:    General appearance: alert, cooperative and no distress Resp: clear to auscultation bilaterally GI: normal findings: soft, non-tender and mildly distended Incision/Wound: Clean and dry  Lab Results:   Recent Labs  09/27/14 0343 09/28/14 0421  WBC 6.4 3.5*  HGB 8.8* 8.0*  HCT 26.8* 24.7*  PLT 144* 124*   BMET  Recent Labs  09/26/14 0345 09/27/14 0343  NA 134* 134*  K 4.0 3.7  CL 102 101  CO2 23 27  GLUCOSE 171* 127*  BUN 11 10  CREATININE 0.44 0.39*  CALCIUM 7.3* 7.3*     Studies/Results: No results found.  Anti-infectives: Anti-infectives    Start     Dose/Rate Route Frequency Ordered Stop   09/25/14 0809  vancomycin (VANCOCIN) IVPB 1000 mg/200 mL premix     1,000 mg 200 mL/hr over 60 Minutes Intravenous On call to O.R. 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  Continue NG until bowel function.  Ambulation encouraged Blood loss anemia-stabilizing-check CBC in AM Severe malnutrition-increased tube feeding to 15 ml/hr   LOS: 3 days    Carlisle Enke T 09/28/2014

## 2014-09-28 NOTE — Progress Notes (Deleted)
Patient voided without difficulty.  Bladder scan performed per MD order.  No residual urine in bladder.  Dr. Marcelle Overlie notified via phone.  Dr. Marcelle Overlie informed that urine appeared bloody.  Per Dr. Marcelle Overlie "It is probably from her vagina."  Patient tolerated procedure well.

## 2014-09-28 NOTE — Progress Notes (Signed)
Nutrition Follow-up  DOCUMENTATION CODES:   Severe malnutrition in context of chronic illness, Underweight  INTERVENTION:   TF adjustment: Will switch Vital AF 1.2 to Vital 1.5 d/t Vital AF 1.2 on back-order.  Continue bottle of Vital AF 1.2 until 1600 today. Initiate Vital 1.5 @ 15 ml/hr and recommend increase by 5 ml every 12 hours to goal rate of 40 ml/hr.   Tube feeding regimen provides 1440 kcal (100% of needs), 65 grams of protein, and 733 ml of H2O.   Recommend monitor magnesium, potassium, and phosphorus daily for at least 3 days, MD to replete as needed, as pt is at risk for refeeding syndrome given severe malnutrition status.  RD to continue to monitor for tolerance and advancement.  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  Ongoing.  GOAL:   Patient will meet greater than or equal to 90% of their needs  Not meeting currently.  MONITOR:   Labs, Weight trends, TF tolerance, Skin, I & O's  REASON FOR ASSESSMENT:   Other (Comment) (new TF)    ASSESSMENT:   69 year old female who presents with a gastric outlet obstruction. She has a long history of problems with peptic ulcer disease and gastric emptying issues. Patient was diagnosed with peptic ulcer disease at least as early as the early 2000s. She has been a chronic cigarette smoker. Also arthritis and history of NSAID use. 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 In December of 2005 she underwent laparoscopic truncal vagotomy and gastrojejunostomy by Dr. Danna Hefty. The patient states that she had some temporary improvement in her symptoms but continued to have early satiety and bloating after meals. This has gradually worsened and become very significant over the last year or so. She feels she is unable to eat much at all or she will develop significant upper  abdominal swelling and pressure, bloating and nausea and belching. She has to severely limit her diet and really have symptoms after any sort of oral intake.   Pt's TF started yesterday. Currently infusing Vital AF 1.2 @ 15 ml/hr. RD notified by pharmacy today that Vital AF 1.2 is currently on back order. RD to switch formula to Vital 1.5. Plan is to finish bottle of Vital AF 1.2 today at 1600. Will initiate Vital 1.5 at current rate of 15 ml/hr and slowly advance to goal per surgery recommendations.  RD will continue to monitor for tolerance and advancement.  Labs reviewed: Low Na, Creatinine  Diet Order:  Diet NPO time specified Except for: Ice Chips  Skin:  Wound (see comment) (abdominal surgical wound)  Last BM:  PTA  Height:   Ht Readings from Last 1 Encounters:  09/25/14  (1.575 m)    Weight:   Wt Readings from Last 1 Encounters:  09/26/14 84 lb 7 oz (38.3 kg)    Ideal Body Weight:  50 kg (kg)  BMI:  Body mass index is 15.44 kg/(m^2).  Estimated Nutritional Needs:   Kcal:  1250-1450  Protein:  60-70g  Fluid:  2.2L/day  EDUCATION NEEDS:   No education needs identified at this time  Tilda Franco, MS, RD, LDN Pager: [phone] After Hours Pager: [phone]

## 2014-09-28 NOTE — Care Management Note (Signed)
Case Management Note  Patient Details  Name: Tammara Massing MRN: [id number] Date of Birth: 01-Jan-1946  Subjective/Objective:            Admitted s/p gastrectomy and Roux-en-Y reconstruction with placement of a jejunal feeding tube        Action/Plan: Discharge planning, spoke with patient and family at bedside. Discussed likely need for [facility] services and what that would look like. Patient and family asked lots of questions, appreciative of assistance. Provided list for [facility] agency choice to family. They will discuss and get back to me. Provided them with my card as well. Will continue to follow.  Expected Discharge Date:                  Expected Discharge Plan:  Home w Home Health Services  In-House Referral:  Nutrition  Discharge planning Services  CM Consult  Post Acute Care Choice:  Home Health Choice offered to:  Patient, Adult Children  DME Arranged:  N/A DME Agency:  NA  HH Arranged:  RN, Disease Management HH Agency:     Status of Service:  In process, will continue to follow  Medicare Important Message Given:  Yes-second notification given Date Medicare IM Given:    Medicare IM give by:    Date Additional Medicare IM Given:    Additional Medicare Important Message give by:     If discussed at Long Length of Stay Meetings, dates discussed:    Additional Comments:  Alexis Goodell, RN 09/28/2014, 11:05 AM

## 2014-09-29 DIAGNOSIS — E43 Unspecified severe protein-calorie malnutrition: Secondary | ICD-10-CM | POA: Insufficient documentation

## 2014-09-29 LAB — CBC
HEMATOCRIT: 23.6 % — AB (ref 36.0–46.0)
HEMOGLOBIN: 7.6 g/dL — AB (ref 12.0–15.0)
MCH: 31.7 pg (ref 26.0–34.0)
MCHC: 32.2 g/dL (ref 30.0–36.0)
MCV: 98.3 fL (ref 78.0–100.0)
Platelets: 147 10*3/uL — ABNORMAL LOW (ref 150–400)
RBC: 2.4 MIL/uL — ABNORMAL LOW (ref 3.87–5.11)
RDW: 12.9 % (ref 11.5–15.5)
WBC: 3.3 10*3/uL — ABNORMAL LOW (ref 4.0–10.5)

## 2014-09-29 LAB — BASIC METABOLIC PANEL
Anion gap: 5 (ref 5–15)
BUN: 5 mg/dL — AB (ref 6–20)
CHLORIDE: 104 mmol/L (ref 101–111)
CO2: 27 mmol/L (ref 22–32)
CREATININE: 0.36 mg/dL — AB (ref 0.44–1.00)
Calcium: 8.1 mg/dL — ABNORMAL LOW (ref 8.9–10.3)
GFR calc non Af Amer: >60 mL/min (ref >60)
Glucose, Bld: 133 mg/dL — ABNORMAL HIGH (ref 65–99)
Potassium: 3.7 mmol/L (ref 3.5–5.1)
Sodium: 136 mmol/L (ref 135–145)

## 2014-09-29 MED ORDER — BISACODYL 10 MG RE SUPP
10.0000 mg | Freq: Once | RECTAL | Status: AC
  Administered 2014-09-29: 10 mg via RECTAL
  Filled 2014-09-29: qty 1

## 2014-09-29 MED ORDER — KETOROLAC TROMETHAMINE 15 MG/ML IJ SOLN
15.0000 mg | Freq: Once | INTRAMUSCULAR | Status: AC
  Administered 2014-09-29: 15 mg via INTRAVENOUS
  Filled 2014-09-29: qty 1

## 2014-09-29 MED ORDER — VITAL 1.5 CAL PO LIQD
1000.0000 mL | ORAL | Status: DC
  Administered 2014-09-29: 1000 mL
  Filled 2014-09-29: qty 1000

## 2014-09-29 NOTE — Progress Notes (Signed)
4 Days Post-Op  Subjective: Walking.  No BM or flatus.  Objective: Vital signs in last 24 hours: Temp:  [98.4 F (36.9 C)-99.1 F (37.3 C)] 98.4 F (36.9 C) (08/20 0552) Pulse Rate:  [66-84] 66 (08/20 0552) Resp:  [16] 16 (08/20 0552) BP: (118-146)/(55-65) 143/65 mmHg (08/20 0552) SpO2:  [93 %-97 %] 97 % (08/20 0552) Last BM Date:  (PTA)  Intake/Output from previous day: 08/19 0701 - 08/20 0700 In: 2211.8 [I.V.:1910.4; NG/GT:281.3] Out: 4300 [Urine:3900; Emesis/NG output:400-bilious Intake/Output this shift:    PE: General- In NAD Abdomen-soft, some distension, incision clean and intact, hypoactive bowel sounds.  Lab Results:   Recent Labs  09/28/14 0421 09/29/14 0555  WBC 3.5* 3.3*  HGB 8.0* 7.6*  HCT 24.7* 23.6*  PLT 124* 147*   BMET  Recent Labs  09/27/14 0343 09/29/14 0555  NA 134* 136  K 3.7 3.7  CL 101 104  CO2 27 27  GLUCOSE 127* 133*  BUN 10 5*  CREATININE 0.39* 0.36*  CALCIUM 7.3* 8.1*   PT/INR No results for input(s): LABPROT, INR in the last 72 hours. Comprehensive Metabolic Panel:    Component Value Date/Time   NA 136 09/29/2014 0555   NA 134* 09/27/2014 0343   K 3.7 09/29/2014 0555   K 3.7 09/27/2014 0343   CL 104 09/29/2014 0555   CL 101 09/27/2014 0343   CO2 27 09/29/2014 0555   CO2 27 09/27/2014 0343   BUN 5* 09/29/2014 0555   BUN 10 09/27/2014 0343   CREATININE 0.36* 09/29/2014 0555   CREATININE 0.39* 09/27/2014 0343   GLUCOSE 133* 09/29/2014 0555   GLUCOSE 127* 09/27/2014 0343   CALCIUM 8.1* 09/29/2014 0555   CALCIUM 7.3* 09/27/2014 0343   AST 9 10/21/2011 0809   ALT 9 10/21/2011 0809   ALKPHOS 50 10/21/2011 0809   BILITOT <0.1* 10/21/2011 0809   PROT 4.4* 10/21/2011 0809   ALBUMIN 2.6* 10/21/2011 0809     Studies/Results: No results found.  Anti-infectives: Anti-infectives    Start     Dose/Rate Route Frequency Ordered Stop   09/25/14 0809  vancomycin (VANCOCIN) IVPB 1000 mg/200 mL premix     1,000 mg 200  mL/hr over 60 Minutes Intravenous On call to O.R. 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  Increase TF to 20 cc/hr.  Leave ng in until bowel function returns. Recheck hemoglobin tomorrow.   [name] Shela Commons 09/29/2014

## 2014-09-30 LAB — CBC
HCT: 22 % — ABNORMAL LOW (ref 36.0–46.0)
Hemoglobin: 7.3 g/dL — ABNORMAL LOW (ref 12.0–15.0)
MCH: 33.3 pg (ref 26.0–34.0)
MCHC: 33.2 g/dL (ref 30.0–36.0)
MCV: 100.5 fL — AB (ref 78.0–100.0)
PLATELETS: 160 10*3/uL (ref 150–400)
RBC: 2.19 MIL/uL — AB (ref 3.87–5.11)
RDW: 13.2 % (ref 11.5–15.5)
WBC: 2.9 10*3/uL — ABNORMAL LOW (ref 4.0–10.5)

## 2014-09-30 MED ORDER — VITAL 1.5 CAL PO LIQD
1000.0000 mL | ORAL | Status: DC
  Administered 2014-09-30: 1000 mL
  Filled 2014-09-30: qty 1000

## 2014-09-30 NOTE — Progress Notes (Signed)
5 Days Post-Op  Subjective: Passing gas.  Had a BM yesterday after suppository.  Having pain from arthritis.  Objective: Vital signs in last 24 hours: Temp:  [97.7 F (36.5 C)-98.9 F (37.2 C)] 98.2 F (36.8 C) (08/21 0600) Pulse Rate:  [65-72] 69 (08/21 0600) Resp:  [16] 16 (08/21 0600) BP: (120-157)/(55-77) 152/77 mmHg (08/21 0600) SpO2:  [98 %-100 %] 100 % (08/21 0600) Last BM Date:  (PTA)  Intake/Output from previous day: 08/19 0701 - 08/20 0700 In: 2211.8 [I.V.:1910.4; NG/GT:281.3] Out: 4300 [Urine:3900; Emesis/NG output 700cc Intake/Output this shift: Total I/O In: -  Out: 325 [Urine:325]  PE: General- In NAD Abdomen-soft, some distension, incision clean and intact,active bowel sounds.  Lab Results:   Recent Labs  09/29/14 0555 09/30/14 0606  WBC 3.3* 2.9*  HGB 7.6* 7.3*  HCT 23.6* 22.0*  PLT 147* 160   BMET  Recent Labs  09/29/14 0555  NA 136  K 3.7  CL 104  CO2 27  GLUCOSE 133*  BUN 5*  CREATININE 0.36*  CALCIUM 8.1*   PT/INR No results for input(s): LABPROT, INR in the last 72 hours. Comprehensive Metabolic Panel:    Component Value Date/Time   NA 136 09/29/2014 0555   NA 134* 09/27/2014 0343   K 3.7 09/29/2014 0555   K 3.7 09/27/2014 0343   CL 104 09/29/2014 0555   CL 101 09/27/2014 0343   CO2 27 09/29/2014 0555   CO2 27 09/27/2014 0343   BUN 5* 09/29/2014 0555   BUN 10 09/27/2014 0343   CREATININE 0.36* 09/29/2014 0555   CREATININE 0.39* 09/27/2014 0343   GLUCOSE 133* 09/29/2014 0555   GLUCOSE 127* 09/27/2014 0343   CALCIUM 8.1* 09/29/2014 0555   CALCIUM 7.3* 09/27/2014 0343   AST 9 10/21/2011 0809   ALT 9 10/21/2011 0809   ALKPHOS 50 10/21/2011 0809   BILITOT <0.1* 10/21/2011 0809   PROT 4.4* 10/21/2011 0809   ALBUMIN 2.6* 10/21/2011 0809     Studies/Results: No results found.  Anti-infectives: Anti-infectives    Start     Dose/Rate Route Frequency Ordered Stop   09/25/14 0809  vancomycin (VANCOCIN) IVPB 1000 mg/200  mL premix     1,000 mg 200 mL/hr over 60 Minutes Intravenous On call to O.R. 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  Keep NPO.  Increase tube feeds to 30 cc/hr   Harrison Paulson J 09/30/2014

## 2014-10-01 MED ORDER — BISACODYL 10 MG RE SUPP
10.0000 mg | Freq: Once | RECTAL | Status: AC
  Administered 2014-10-01: 10 mg via RECTAL
  Filled 2014-10-01: qty 1

## 2014-10-01 MED ORDER — VITAL 1.5 CAL PO LIQD
1000.0000 mL | ORAL | Status: DC
  Administered 2014-10-01 – 2014-10-02 (×2): 1000 mL
  Filled 2014-10-01 (×3): qty 1000

## 2014-10-01 NOTE — Progress Notes (Signed)
Patient ID: [name], female   DOB: 1945-10-12, 70 y.o.   MRN: [id number] 6 Days Post-Op  Subjective: Tired of being in hospital and wants to go home.  No nausea with NG out.  Small BM yest.  Denies pain  Objective: Vital signs in last 24 hours: Temp:  [98.2 F (36.8 C)-99.5 F (37.5 C)] 98.2 F (36.8 C) (08/22 0500) Pulse Rate:  [69-76] 70 (08/22 0500) Resp:  [17-18] 18 (08/22 0500) BP: (145-147)/(59-64) 145/60 mmHg (08/22 0500) SpO2:  [97 %-100 %] 97 % (08/22 0500) Last BM Date:  (PTA)  Intake/Output from previous day: 08/21 0701 - 08/22 0700 In: 2171 [I.V.:1800; NG/GT:371] Out: 3020 [Urine:2975; Emesis/NG output:25; Drains:20] Intake/Output this shift:    General appearance: alert, cooperative and no distress GI: normal findings: soft, non-tender and mild distention Incision/Wound: Clean and dry  Lab Results:   Recent Labs  09/29/14 0555 09/30/14 0606  WBC 3.3* 2.9*  HGB 7.6* 7.3*  HCT 23.6* 22.0*  PLT 147* 160   BMET  Recent Labs  09/29/14 0555  NA 136  K 3.7  CL 104  CO2 27  GLUCOSE 133*  BUN 5*  CREATININE 0.36*  CALCIUM 8.1*     Studies/Results: No results found.  Anti-infectives: Anti-infectives    Start     Dose/Rate Route Frequency Ordered Stop   09/25/14 0809  vancomycin (VANCOCIN) IVPB 1000 mg/200 mL premix     1,000 mg 200 mL/hr over 60 Minutes Intravenous On call to O.R. 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  Start CL diet.  TF at 35 ml/hr  Dulcolax supp today..Ambulate   LOS: 6 days    Iyona Pehrson T 10/01/2014

## 2014-10-02 LAB — CBC
HEMATOCRIT: 23.4 % — AB (ref 36.0–46.0)
Hemoglobin: 7.7 g/dL — ABNORMAL LOW (ref 12.0–15.0)
MCH: 33 pg (ref 26.0–34.0)
MCHC: 32.9 g/dL (ref 30.0–36.0)
MCV: 100.4 fL — ABNORMAL HIGH (ref 78.0–100.0)
Platelets: 219 10*3/uL (ref 150–400)
RBC: 2.33 MIL/uL — ABNORMAL LOW (ref 3.87–5.11)
RDW: 13.3 % (ref 11.5–15.5)
WBC: 3.4 10*3/uL — ABNORMAL LOW (ref 4.0–10.5)

## 2014-10-02 NOTE — Progress Notes (Signed)
Nutrition Follow-up  DOCUMENTATION CODES:   Severe malnutrition in context of chronic illness, Underweight  INTERVENTION:   Diet advancement per MD Continue Vital 1.5 @ 35 ml/hr, providing 1260 kcal and 57g of protein. RD to follow-up with pt before discharge for educational needs  NUTRITION DIAGNOSIS:   Altered GI function related to chronic illness as evidenced by other (see comment) (gastric outlet obstruction s/p surgery 8/16).  Ongoing.  GOAL:   Patient will meet greater than or equal to 90% of their needs  Progressing.  MONITOR:   Diet advancement, Labs, Weight trends, TF tolerance, Skin, I & O's  REASON FOR ASSESSMENT:   Other (Comment) (new TF)    ASSESSMENT:   69 year old female who presents with a gastric outlet obstruction. She has a long history of problems with peptic ulcer disease and gastric emptying issues. Patient was diagnosed with peptic ulcer disease at least as early as the early 2000s. She has been a chronic cigarette smoker. Also arthritis and history of NSAID use. 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 In December of 2005 she underwent laparoscopic truncal vagotomy and gastrojejunostomy by Dr. Danna Hefty. The patient states that she had some temporary improvement in her symptoms but continued to have early satiety and bloating after meals. This has gradually worsened and become very significant over the last year or so. She feels she is unable to eat much at all or she will develop significant upper abdominal swelling and pressure, bloating and nausea and belching. She has to severely limit her diet and really have symptoms after any sort of oral intake.   Pt reports tolerating clear liquids and TF at rate of 35 ml/hr. Pt's diet has been advanced to full liquids. Per surgery, if pt tolerated full liquid diet she would not need tube feeding at home. Pt is interested in education regarding protein and high calorie  foods. RD to follow-up with pt prior to discharge.  Nutrition-Focused physical exam completed. Findings are severe fat depletion, severe muscle depletion, and no edema.   Labs reviewed: Elevated BUN & Creatinine  Diet Order:  Diet full liquid Room service appropriate?: Yes; Fluid consistency:: Thin  Skin:  Wound (see comment) (abdominal surgical wound)  Last BM:  8/21  Height:   Ht Readings from Last 1 Encounters:  09/25/14  (1.575 m)    Weight:   Wt Readings from Last 1 Encounters:  09/26/14 84 lb 7 oz (38.3 kg)    Ideal Body Weight:  50 kg (kg)  BMI:  Body mass index is 15.44 kg/(m^2).  Estimated Nutritional Needs:   Kcal:  1250-1450  Protein:  60-70g  Fluid:  2.2L/day  EDUCATION NEEDS:   No education needs identified at this time  Tilda Franco, MS, RD, LDN Pager: [phone] After Hours Pager: [phone]

## 2014-10-02 NOTE — Progress Notes (Signed)
Patient ID: [name], female   DOB: [birth year], 69 y.o.   MRN: [id number] 7 Days Post-Op  Subjective: No complaints today. Looking forward to going home. Had several bowel movements yesterday. Tolerating clear liquids without any nausea or other difficulty  Objective: Vital signs in last 24 hours: Temp:  [98 F (36.7 C)-98.4 F (36.9 C)] 98.4 F (36.9 C) (08/23 0548) Pulse Rate:  [63-85] 85 (08/23 0548) Resp:  [18] 18 (08/23 0548) BP: (131-146)/(50-63) 131/50 mmHg (08/23 0548) SpO2:  [97 %-100 %] 97 % (08/23 0548) Last BM Date: 09/30/14  Intake/Output from previous day: 08/22 0701 - 08/23 0700 In: 3181.8 [P.O.:480; I.V.:1875; NG/GT:826.8] Out: 751 [Urine:750; Stool:1] Intake/Output this shift: Total I/O In: 330 [I.V.:225; NG/GT:105] Out: -   General appearance: alert, cooperative and no distress GI: normal findings: soft, non-tender Incision/Wound: cclean and dry without evidence of infection  Lab Results:   Recent Labs  09/30/14 0606 10/02/14 0436  WBC 2.9* 3.4*  HGB 7.3* 7.7*  HCT 22.0* 23.4*  PLT 160 219   BMET No results for input(s): NA, K, CL, CO2, GLUCOSE, BUN, CREATININE, CALCIUM in the last 72 hours.   Studies/Results: No results found.  Anti-infectives: Anti-infectives    Start     Dose/Rate Route Frequency Ordered Stop   09/25/14 0809  vancomycin (VANCOCIN) IVPB 1000 mg/200 mL premix     1,000 mg 200 mL/hr over 60 Minutes Intravenous On call to O.R. 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 Advance to full liquid diet today. Anticipate discharge tomorrow. If tolerates full liquids will not continue tube feedings at home and I do not anticipate any home health needs.    LOS: 7 days    Sharian Delia T 10/02/2014

## 2014-10-02 NOTE — Care Management Important Message (Signed)
Important Message  Patient Details  Name: [name] MRN: [id number] Date of Birth: October 13, 1945   Medicare Important Message Given:  Yes-third notification given    Haskell Flirt 10/02/2014, 12:33 PMImportant Message  Patient Details  Name: [name] MRN: [id number] Date of Birth: 05/02/1945   Medicare Important Message Given:  Yes-third notification given    Haskell Flirt 10/02/2014, 12:33 PM

## 2014-10-02 NOTE — Care Management Note (Signed)
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 Patient states daughter, Darl Pikes will stay with patient for a while at d/c, sister will also assist with care. Patient has no preference for [facility] agency, ok with [facility]. Contacted [facility] to arrange, awaiting final orders. No DME needs identified at this time.   Expected Discharge Date:                  Expected Discharge Plan:  Home w Home Health Services  In-House Referral:  Nutrition  Discharge planning Services  CM Consult  Post Acute Care Choice:  Home Health Choice offered to:  Patient, Adult Children  DME Arranged:  N/A DME Agency:  NA  HH Arranged:  RN, Disease Management HH Agency:  [facility]  Status of Service:  Completed, signed off  Medicare Important Message Given:  Yes-second notification given Date Medicare IM Given:    Medicare IM give by:    Date Additional Medicare IM Given:    Additional Medicare Important Message give by:     If discussed at Long Length of Stay Meetings, dates discussed:    Additional Comments:  Alexis Goodell, RN 10/02/2014, 10:36 AM

## 2014-10-03 MED ORDER — HYDROCODONE-ACETAMINOPHEN 5-325 MG PO TABS
1.0000 | ORAL_TABLET | ORAL | Status: DC | PRN
  Administered 2014-10-03: 2 via ORAL
  Filled 2014-10-03: qty 2

## 2014-10-03 MED ORDER — HYDROCODONE-ACETAMINOPHEN 5-325 MG PO TABS: 1.0000 | ORAL_TABLET | ORAL | Status: DC | PRN

## 2014-10-03 NOTE — Progress Notes (Signed)
Patient ID: [name], female   DOB: 04-03-1945, 69 y.o.   MRN: [id number] 8 Days Post-Op  Subjective: Feels well today. Tolerating full liquids without any nausea or bloating. Has had another bowel movement.  Objective: Vital signs in last 24 hours: Temp:  [98.1 F (36.7 C)-98.8 F (37.1 C)] 98.1 F (36.7 C) (08/24 0552) Pulse Rate:  [76-79] 78 (08/24 0552) Resp:  [18] 18 (08/24 0552) BP: (145-162)/(58-75) 145/58 mmHg (08/24 0552) SpO2:  [97 %-99 %] 99 % (08/24 0552) Last BM Date: 09/30/14  Intake/Output from previous day: 08/23 0701 - 08/24 0700 In: 1752.8 [P.O.:940; I.V.:436.6; NG/GT:376.3] Out: 600 [Urine:600] Intake/Output this shift:    General appearance: alert, cooperative and no distress GI: normal findings: soft, non-tender and nnondistended Incision/Wound: clean and dry without evidence of infection  Lab Results:   Recent Labs  10/02/14 0436  WBC 3.4*  HGB 7.7*  HCT 23.4*  PLT 219   BMET No results for input(s): NA, K, CL, CO2, GLUCOSE, BUN, CREATININE, CALCIUM in the last 72 hours.   Studies/Results: No results found.  Anti-infectives: Anti-infectives    Start     Dose/Rate Route Frequency Ordered Stop   09/25/14 0809  vancomycin (VANCOCIN) IVPB 1000 mg/200 mL premix     1,000 mg 200 mL/hr over 60 Minutes Intravenous On call to O.R. 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 Tolerating diet well with good bowel function. Ambulatory in the halls. She appears ready for discharge and she strongly desires to go home.   LOS: 8 days    [name] T 10/03/2014

## 2014-10-03 NOTE — Progress Notes (Signed)
Patient was taught how to flush tube. Patient was able to return demonstrate care for the tube. Patient was instructed on incision care at home. Patient has verbalizes understanding of discharge instructions. Patient was given a prescription for pain medication and instructed to call [facility] today to make 3 week follow up appointment.

## 2014-10-03 NOTE — Care Management Note (Signed)
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 Patient discussed with attending and no plans to use feeding tube currently, patient has received instruction on care of tube and feels no need for [facility]. Cancelled referral to [facility].  Expected Discharge Date:                  Expected Discharge Plan:  Home/Self Care  In-House Referral:  Nutrition  Discharge planning Services  CM Consult  Post Acute Care Choice:  Home Health, NA Choice offered to:  Patient, Adult Children  DME Arranged:  N/A DME Agency:  NA  HH Arranged:  RN, Disease Management, NA HH Agency:  [facility], NA  Status of Service:  Completed, signed off  Medicare Important Message Given:  Yes-third notification given Date Medicare IM Given:    Medicare IM give by:    Date Additional Medicare IM Given:    Additional Medicare Important Message give by:     If discussed at Long Length of Stay Meetings, dates discussed:    Additional Comments:  Alexis Goodell, RN 10/03/2014, 10:25 AM

## 2014-10-03 NOTE — Discharge Summary (Signed)
Patient ID: [name] [id number] 69 y.o. 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 She subsequently developed a marginal ulcer which was treated but has progressed to near total gastric outlet obstruction on recent endoscopy with marked symptoms of food intolerance, bloating, nausea and weight loss. After extensive preoperative workup and discussion detailed elsewhere we have elected to proceed with subtotal gastrectomy and gastrojejunostomy with placement of a jejunal feeding tube.  Hospital Course: patient was admitted on the morning of her procedure and underwent an uneventful subtotal gastrectomy with resection of her previous gastrojejunostomy, Roux-en-Y reconstruction and placement of a jejunal feeding tube. She tolerated the procedure well. Her postoperative course was uncomplicated. She was started on jejunal tube feedings immediately postoperatively which were gradually increased. Nasogastric tube remained in place with some expected ileus. After approximately 4-5 days she began having flatus and NG tube was removed. She was then started on a clear liquid diet. Jejunal feeds were gradually advanced. She was then able to be advanced to a full liquid diet which she tolerated well. She had some postoperative acute blood loss anemia and her hemoglobin decreased to about 7.3 but was increased to 7.7 at the time of discharge. At the time of discharge she is tolerating a full liquid  diet and having bowel movements. Abdomen is soft and nontender. Midline incision well healed and staples are removed. Jejunal feeding tube will remain in place but no feedings planned.   Disposition: Home  Patient Instructions:    Medication List    STOP taking these medications        azithromycin 250 MG tablet  Commonly known as:  ZITHROMAX     pantoprazole 40 MG tablet  Commonly known as:  PROTONIX      TAKE these medications        butalbital-acetaminophen-caffeine 50-325-40 MG per tablet  Commonly known as:  FIORICET, ESGIC  Take 1 tablet by mouth every 6 (six) hours as needed. Headaches.     esomeprazole 40 MG capsule  Commonly known as:  NEXIUM  Take 40 mg by mouth 2 (two) times daily.     HYDROcodone-acetaminophen 5-325 MG per tablet  Commonly known as:  NORCO/VICODIN  Take 1-2 tablets by mouth every 4 (four) hours as needed for moderate pain or severe pain.     HYDROcodone-acetaminophen 5-325 MG per tablet  Commonly known as:  NORCO/VICODIN  Take 1-2 tablets by mouth every 4 (four) hours as needed for moderate pain.     levothyroxine 137 MCG tablet  Commonly known as:  SYNTHROID, LEVOTHROID  Take 137 mcg by mouth daily before breakfast.     multivitamin capsule  Take 1 capsule by mouth every morning.     traMADol 50 MG tablet  Commonly known as:  ULTRAM  Take 50 mg by mouth every 6 (six) hours as needed for moderate pain. Osteoarthritis pain Takes with Vicodin        Activity: no heavy lifting for 4 weeks Diet: full liquid or pured diet for 2 weeks then gradually resume solid food Wound Care: patient instructed to flush jejunostomy tube with water twice daily  Follow-up:  With Dr. Johna Sheriff in 3 week.  Signed: Mariella Saa MD, FACS  10/03/2014, 6:42 PM

## 2014-11-22 ENCOUNTER — Encounter: Payer: Self-pay | Admitting: Dietician

## 2014-11-22 ENCOUNTER — Encounter: Payer: Medicare Other | Attending: Internal Medicine | Admitting: Dietician

## 2014-11-22 VITALS — Ht 62.0 in | Wt 81.0 lb

## 2014-11-22 DIAGNOSIS — R636 Underweight: Secondary | ICD-10-CM | POA: Diagnosis present

## 2014-11-22 DIAGNOSIS — Z713 Dietary counseling and surveillance: Secondary | ICD-10-CM | POA: Diagnosis not present

## 2014-11-22 NOTE — Progress Notes (Signed)
Medical Nutrition Therapy:  Appt start time: 1045 end time:  1130.   Assessment:  Primary concerns today: [name] is here today to discuss gaining weight. She reports that she developed an ulcer around 2005-2007 requiring surgery. After surgery [name] states she became progressively sicker and lost more weight in following years. Normal adult weight per patient: 105 lbs. She reports that she was found to have "2 openings" in her stomach which was repaired by Dr. Johna SheriffHoxworth in August 2016. She used to have a lot of issues with constipation and this has gotten much better since August. Has 2 bowel movements "like clockwork" every morning. She had a jejunostomy tube placed and this was removed 2 weeks ago; only received feedings in the hospital. She thinks she has a hernia around where the feeding tube was but "it's not bothering her;" she reports plans to discuss this with Dr. Johna SheriffHoxworth during her appointment in December. Has lost about 5 pounds since leaving the hospital. Was 87 lbs before surgery. [name] states that she would like to gain 20 pounds. She does not sleep well but states has never slept well. Not having trouble with vomiting or diarrhea since surgery in August. Taking Centrum Silver multivitamin and iron supplement.   Samples provided and patient instructed on proper use: Unjury protein powder (unflavored - qty 2) Lot#: [id number] Exp: 08/2015  Unjury protein powder (vanilla - qty 2) Lot#: [id number] Exp: 08/2015  Unjury protein powder (strawberry - qty 2) Lot#: [id number] Exp: 10/2015   Preferred Learning Style:   No preference indicated   Learning Readiness:   Ready   MEDICATIONS: see list   DIETARY INTAKE:  Usual eating pattern includes 3 meals and 2 snacks per day.  Cannot tolerate milk products like Ensure, pudding, cream soups, and yogurt. Tolerates half and half in coffee; has not tried cheese yet. Also avoids very greasy foods and foods that cause gas like ham and cabbage. She  states she has never eaten much meat.  Can tolerate about 2 oz of meat at a time.   24-hr recall:  Wakes up around 6 am  B (7-7:15 AM): 2 fried eggs with rotisserie chicken, 2 cups coffee with half and half   Snk ( AM): none  L (12:30-1:30 PM): chicken or egg salad sandwich on U[employer] bread, "a lot of potato chips," water Snk (4 PM): cookie and coffee D (6 PM): fried or broiled chicken, mashed potatoes, and vegetable from [employer] Snk ( PM): cookies, pretzels, cheese crackers  Goes to bed between 12:30 and 1:30 am  Beverages: water, coffee  Usual physical activity: walks dog 3x a day  Estimated energy needs: 1800-2000 calories 200-225 g carbohydrates 135-150 g protein 50-56 g fat  Progress Towards Goal(s):  In progress.   Nutritional Diagnosis:  [place]-3.1 Underweight As related to history of inability to consume/absorb adequate energy due to stomach and intestinal surgery  As evidenced by BMI 14.8.    Intervention:  Nutrition counseling provided. Praised patient on effort to eat high protein meals 3x a day. Discussed increasing daily calories by adding high calorie snacks such as full fat cheese (if tolerated), lactose-free protein shakes, and nuts or nut butters. Recommended that the patient add calcium, vitamin D, and vitamin B12 supplements.    Teaching Method Utilized:  Visual Auditory Hands on  Barriers to learning/adherence to lifestyle change: inability to consume sufficient calories  Demonstrated degree of understanding via:  Teach Back   Monitoring/Evaluation:  Dietary intake and body weight prn.

## 2014-11-22 NOTE — Patient Instructions (Addendum)
-  Consider trying cheese (full fat) and nuts and peanut butter -Consider adding a mid morning snack  -Protein shake with powder, soy/almond/Lactaid milk  -Try adding protein powder to warm coffee -Start taking Calcium supplement (Citracal Petites) and 5,000 IU vitamin D  -Get a sublingual vitamin B12

## 2015-04-02 ENCOUNTER — Ambulatory Visit (INDEPENDENT_AMBULATORY_CARE_PROVIDER_SITE_OTHER): Payer: Medicare Other | Admitting: Internal Medicine

## 2015-04-02 ENCOUNTER — Encounter: Payer: Self-pay | Admitting: Internal Medicine

## 2015-04-02 VITALS — BP 138/68 | HR 73 | Ht 62.0 in | Wt 98.4 lb

## 2015-04-02 DIAGNOSIS — J439 Emphysema, unspecified: Secondary | ICD-10-CM | POA: Diagnosis not present

## 2015-04-02 DIAGNOSIS — F17200 Nicotine dependence, unspecified, uncomplicated: Secondary | ICD-10-CM | POA: Diagnosis not present

## 2015-04-02 DIAGNOSIS — E43 Unspecified severe protein-calorie malnutrition: Secondary | ICD-10-CM | POA: Diagnosis not present

## 2015-04-02 NOTE — Patient Instructions (Signed)
Please call if we can help  We will check with Dr Venita Sheffield office to get record of your pneumonia vaccinations

## 2015-04-02 NOTE — Assessment & Plan Note (Signed)
Mild, small airway obstruction despite prolonged smoking history. Not using bronchodilators and denies wheeze or cough. Chest x-ray was stable.

## 2015-04-02 NOTE — Progress Notes (Signed)
Patient ID: [name], female    DOB: [birth year], 70 y.o.   MRN: [id number]  HPI 09/30/10- 26 yoF smoker followed for allergic rhinitis and recurrent bronchitis. 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  She denies respiratory concerns through the long spring pollen season or summer heat. Still denies morning cough, phlegm or wheeze.  Dr Donette Larry PCP gets annual CXR  No recent PFT Pneumovax at least once  02/13/11- 65 yoF smoker followed for allergic rhinitis and recurrent bronchitis. Has had a recent sore throat, green discharge from nose and chest and low-grade fever. She is now taking a Z-Pak and feels better but asks Depo-Medrol. Her daughter is using a Neti pot and we discussed saline lavage. Since last here had thyroidectomy in March, uncomplicated. PFT 10/14/10-reviewed with her, showing mild/ moderate obstructive airways disease with insignificant response to bronchodilator, hyperinflation and air trapping, normal diffusion. FEV1/FVC 0.65. I used this as a basis for talking again with her about smoking cessation today.  09/30/11- 65 yoF smoker followed for allergic rhinitis and recurrent bronchitis. Last week had bout of colored phelgm-green and thick in color, felt like fever but no temps when checked. Took prednisone medrol pak and this helped with symptoms. Back to baseline now but noting some vertex/tension headache, nasal congestion. Asks refill fluticasone. Still smoking. We discussed this again. She is not motivated to stop despite my efforts.  03/31/12- 67 yoF smoker followed for allergic rhinitis and recurrent bronchitis. FOLLOWS FOR: denies any SOB, wheezing, cough, or congestion She was hospitalized for upper GI bleed and transfused. Stabilized now and feeling much stronger. CXR 10/09/11 IMPRESSION:  COPD. No active disease.  Original Report Authenticated By: Cyndie Chime, M.D.  10/13/12- 8 yoF smoker followed  for allergic rhinitis and recurrent bronchitis. FOLLOWS FOR: Breathing is unchanged since last OV.  No complaints today Minor seasonal watery rhinorrhea without much sneezing. She feels well controlled. No change in dyspnea with exertion. No routine cough or phlegm. She is not making an effort to stop smoking despite discussion  11/30/13- 68 yoF smoker followed for allergic rhinitis and recurrent bronchitis, complicated by hx UGI bleed/ anemia FOLLOW FOR:  COPD; Asthma; runny nose, sinus pressure last week Plans flu vax in November    Still smokes against advise Doing fine, c/o watery nose CXR 10/13/12 IMPRESSION:  COPD changes.  No acute abnormalities.  Original Report Authenticated By: Ulyses Southward, M.D. Office Spirometry- 11/30/13- Mild airway obstruction. FVC 2.81/ 116%, FEV1 1.49/ 80%, FEV1/FVC 0.53.  04/02/2015-70 year old female smoker followed for allergic rhinitis, recurrent bronchitis, complicated by history he GI bleed/anemia Follows for: COPD. Pt states that her breathing is doing well. Pt denies cough/wheeze/SOB/CP/tightness.  CXR 11/30/2013-NAD Says breathing "fine". 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 Denies respiratory complications of surgery. She is not sure of pneumonia vaccine status done at [facility]   Review of Systems- see HPI Constitutional:   No-   weight loss, night sweats, fevers, chills, fatigue, lassitude. HEENT:   +  headaches, no-difficulty swallowing, tooth/dental problems, sore throat,       No-  sneezing, itching, ear ache,  +nasal congestion, +post nasal drip,  CV:  No-   chest pain, orthopnea, PND, swelling in lower extremities, anasarca, dizziness, palpitations Resp: little acute  shortness of breath with exertion or at rest.              No- productive cough,  No non-productive cough,  No-  coughing up of blood.              No-change in color of mucus.  No- wheezing.   Skin: No-   rash or  lesions. GI:  No-   heartburn, indigestion, abdominal pain, nausea, vomiting, GU:  MS:  No-   joint pain or swelling.   Neuro- nothing unusual Psych:  No- change in mood or affect. No depression or anxiety.  No memory loss.  Objective:   Physical Exam General- Alert, Oriented, Affect-appropriate, Distress- none acute, looks well Skin- rash-none, lesions- none, excoriation- none. Normally pale complexion, thin Lymphadenopathy- none Head- atraumatic            Eyes- Gross vision intact, PERRLA, conjunctivae clear secretions    Periorbital edema            Ears- Hearing, canals normal            Nose- , no-Septal dev, mucus, polyps, erosion, perforation             Throat- Mallampati II , mucosa clear , drainage- none, tonsils- atrophic Neck- flexible , trachea midline, no stridor , thyroid nl, carotid no bruit Chest - symmetrical excursion , unlabored           Heart/CV- RRR , no murmur , no gallop  , no rub, nl s1 s2                           - JVD- none , edema- none, stasis changes- none, varices- none           Lung- +decreased/ clear, wheeze- none, cough- none , dullness-none, rub- none               Chest wall-  Abd- Br/ Gen/ Rectal- Not done, not indicated Extrem- +synovial thickening on hands Neuro- grossly intact to observation

## 2015-04-02 NOTE — Assessment & Plan Note (Signed)
She is not trying to stop smoking. Discussed.

## 2015-04-02 NOTE — Assessment & Plan Note (Signed)
Gaining weight after partial gastrectomy relieved outlet obstruction due to adhesions

## 2015-05-17 ENCOUNTER — Telehealth: Payer: Self-pay | Admitting: Internal Medicine

## 2015-05-17 MED ORDER — SULFAMETHOXAZOLE-TRIMETHOPRIM 800-160 MG PO TABS: 1.0000 | ORAL_TABLET | Freq: Two times a day (BID) | ORAL | Status: DC

## 2015-05-17 NOTE — Telephone Encounter (Signed)
Per CY-okay to give patient Bactrim DS  #14 take 1 po BID no refills.     Pt is aware that Rx has been sent. Nothing more needed at this time.

## 2015-12-03 ENCOUNTER — Other Ambulatory Visit: Payer: Self-pay | Admitting: Internal Medicine

## 2015-12-03 DIAGNOSIS — Z1231 Encounter for screening mammogram for malignant neoplasm of breast: Secondary | ICD-10-CM

## 2015-12-03 DIAGNOSIS — M810 Age-related osteoporosis without current pathological fracture: Secondary | ICD-10-CM

## 2015-12-06 ENCOUNTER — Other Ambulatory Visit: Payer: Self-pay | Admitting: Internal Medicine

## 2015-12-06 DIAGNOSIS — M1990 Unspecified osteoarthritis, unspecified site: Secondary | ICD-10-CM

## 2015-12-06 DIAGNOSIS — M810 Age-related osteoporosis without current pathological fracture: Secondary | ICD-10-CM

## 2015-12-19 ENCOUNTER — Other Ambulatory Visit: Payer: Medicare Other

## 2015-12-19 ENCOUNTER — Ambulatory Visit: Payer: Medicare Other

## 2016-03-17 ENCOUNTER — Telehealth: Payer: Self-pay | Admitting: Internal Medicine

## 2016-03-17 MED ORDER — AZITHROMYCIN 250 MG PO TABS: ORAL_TABLET | ORAL | 0 refills | Status: AC

## 2016-03-17 NOTE — Telephone Encounter (Signed)
Pt aware of CY's recommendations & voiced her understanding. Rx sent to preferred pharmacy. Nothing further needed.  

## 2016-03-17 NOTE — Telephone Encounter (Signed)
Offer Zpak    250 mg, # 6, 2 today then one daily 

## 2016-03-17 NOTE — Telephone Encounter (Signed)
Spoke with pt. States that she is not feeling well. Reports cough, SOB, sinus headache, rib pain and trouble sleeping. Cough is producing green mucus. Denies chest tightness, wheezing or fever. Symptoms started 2 weeks ago. Has been taking Sudafed with no relief. Would like to have an antibiotic sent in.  CY - please advise. Thanks.  Allergies  Allergen Reactions  . Doxycycline Itching  . Levofloxacin     REACTION: GI upset, mouth/throat sores  . Penicillins     REACTION: itch   Current Outpatient Prescriptions on File Prior to Visit  Medication Sig Dispense Refill  . butalbital-acetaminophen-caffeine (FIORICET, ESGIC) 50-325-40 MG per tablet Take 1 tablet by mouth every 6 (six) hours as needed. Headaches.  1  . esomeprazole (NEXIUM) 40 MG capsule Take 40 mg by mouth 2 (two) times daily.    Marland Kitchen. HYDROcodone-acetaminophen (NORCO/VICODIN) 5-325 MG tablet Take 1-2 tablets by mouth every 4 (four) hours as needed for moderate pain.    Marland Kitchen. levothyroxine (SYNTHROID, LEVOTHROID) 125 MCG tablet Take 1 tablet by mouth daily.  4  . Multiple Vitamin (MULTIVITAMIN) capsule Take 1 capsule by mouth every morning.     . sulfamethoxazole-trimethoprim (BACTRIM DS,SEPTRA DS) 800-160 MG tablet Take 1 tablet by mouth 2 (two) times daily. 14 tablet 0  . traMADol (ULTRAM) 50 MG tablet Take 50 mg by mouth every 6 (six) hours as needed for moderate pain. Osteoarthritis pain Takes with Vicodin     No current facility-administered medications on file prior to visit.

## 2016-04-02 ENCOUNTER — Ambulatory Visit: Payer: Medicare Other | Admitting: Internal Medicine

## 2016-04-15 ENCOUNTER — Encounter: Payer: Self-pay | Admitting: Internal Medicine

## 2016-04-15 ENCOUNTER — Ambulatory Visit (INDEPENDENT_AMBULATORY_CARE_PROVIDER_SITE_OTHER): Payer: Medicare Other | Admitting: Internal Medicine

## 2016-04-15 ENCOUNTER — Other Ambulatory Visit: Payer: Medicare Other

## 2016-04-15 ENCOUNTER — Ambulatory Visit (INDEPENDENT_AMBULATORY_CARE_PROVIDER_SITE_OTHER)
Admission: RE | Admit: 2016-04-15 | Discharge: 2016-04-15 | Disposition: A | Discharge status: Home / Self Care | Payer: Medicare Other | Source: Ambulatory Visit | Attending: Internal Medicine | Admitting: Internal Medicine

## 2016-04-15 VITALS — BP 142/72 | HR 74 | Ht 62.0 in | Wt 91.4 lb

## 2016-04-15 DIAGNOSIS — J449 Chronic obstructive pulmonary disease, unspecified: Secondary | ICD-10-CM

## 2016-04-15 DIAGNOSIS — J439 Emphysema, unspecified: Secondary | ICD-10-CM | POA: Diagnosis not present

## 2016-04-15 DIAGNOSIS — F17200 Nicotine dependence, unspecified, uncomplicated: Secondary | ICD-10-CM | POA: Diagnosis not present

## 2016-04-15 MED ORDER — UMECLIDINIUM-VILANTEROL 62.5-25 MCG/INH IN AEPB: 1.0000 | INHALATION_SPRAY | Freq: Every day | RESPIRATORY_TRACT | 0 refills | Status: DC

## 2016-04-15 NOTE — Patient Instructions (Addendum)
Sample Anoro Ellipta inhaler   inhale 1 puff, once daily    See if it helps your breathing  Reminder- please don't smoke !- We care about you.  Order- CXR     Dx COPD mixed type  Order- lab- a1AT phenotype  Please call if we can help

## 2016-04-15 NOTE — Assessment & Plan Note (Signed)
I bring up the issue and ask her to consider trying to quit. She has not been motivated to do so.

## 2016-04-15 NOTE — Progress Notes (Signed)
Patient ID: [name], female    DOB: 06-26-1945, 71 y.o.   MRN: [id number]  HPI  female smoker followed for allergic rhinitis, recurrent bronchitis, complicated by history he GI bleed/anemia PFT 10/14/10-reviewed with her, showing mild/ moderate obstructive airways disease with insignificant response to bronchodilator, hyperinflation and air trapping, normal diffusion. FEV1/FVC 0.65. Office Spirometry- 11/30/13- Mild airway obstruction. FVC 2.81/ 116%, FEV1 1.49/ 80%, FEV1/FVC 0.53. -----------------------------------------------------------------------------------   04/02/2015-71 year old female smoker followed for allergic rhinitis, recurrent bronchitis, complicated by history he GI bleed/anemia Follows for: COPD. Pt states that her breathing is doing well. Pt denies cough/wheeze/SOB/CP/tightness.  CXR 11/30/2013-NAD Says breathing "fine". 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 Denies respiratory complications of surgery. She is not [facility]  04/15/2016-71 year old female smoker followed for allergic rhinitis, recurrent bronchitis/ COPD, complicated by history GI bleed/anemia Follows For: COPD Still smoking without intention to stop. Feels very stable with no acute respiratory events this winter. Summer humidity tends to bother her most. She has no inhalers at all but doesn't notice routine significant cough or any wheeze. Pneumonia vaccine provided by her primary physician. Had flu shot this fall.  Review of Systems- see HPI Constitutional:   No-   weight loss, night sweats, fevers, chills, fatigue, lassitude. HEENT:     headaches, no-difficulty swallowing, tooth/dental problems, sore throat,       No-  sneezing, itching, ear ache,  +nasal congestion, +post nasal drip,  CV:  No-   chest pain, orthopnea, PND, swelling in lower extremities, anasarca, dizziness, palpitations Resp: little  acute  shortness of breath with exertion or at rest.              No- productive cough,  No non-productive cough,  No-  coughing up of blood.              No-change in color of mucus.  No- wheezing.   Skin: No-   rash or lesions. GI:  No-   heartburn, indigestion, abdominal pain, nausea, vomiting, GU:  MS:  No-   joint pain or swelling.   Neuro- nothing unusual Psych:  No- change in mood or affect. No depression or anxiety.  No memory loss.  Objective:   Physical Exam General- Alert, Oriented, Affect-appropriate, Distress- none acute, looks well Skin- rash-none, lesions- none, excoriation- none. Normally pale complexion, thin Lymphadenopathy- none Head- atraumatic            Eyes- Gross vision intact, PERRLA, conjunctivae clear secretions                Ears- Hearing, canals normal            Nose- , no-Septal dev, mucus, polyps, erosion, perforation             Throat- Mallampati II , mucosa clear , drainage- none, tonsils- atrophic Neck- flexible , trachea midline, no stridor , thyroid nl, carotid no bruit Chest - symmetrical excursion , unlabored           Heart/CV- RRR , no murmur , no gallop  , no rub, nl s1 s2                           - JVD- none , edema- none, stasis changes- none, varices- none           Lung- +decreased/ clear, wheeze- none, cough- none , dullness-none, rub- none  Chest wall-  Abd- Br/ Gen/ Rectal- Not done, not indicated Extrem- +Arthritic deformities of fingers Neuro- grossly intact to observation

## 2016-04-15 NOTE — Assessment & Plan Note (Signed)
No exacerbation and exam today is near her baseline despite her ongoing smoking against advice. Plan-a1AT fior documentation. CXR for update. Try sample Anoro inhaler for effect.

## 2016-04-28 ENCOUNTER — Telehealth: Payer: Self-pay | Admitting: Internal Medicine

## 2016-04-28 NOTE — Telephone Encounter (Signed)
LMTCB

## 2016-04-30 NOTE — Telephone Encounter (Signed)
CXR- lungs are somewhat over-inflated from COPD, but there is no new or active process. Stable.   Called and spoke with pt and she is aware of results per CY

## 2016-05-05 NOTE — Progress Notes (Signed)
Spoke with patient and informed her of results. Pt verbalized understanding and did not have any questions. Nothing further is needed.

## 2016-06-03 ENCOUNTER — Telehealth: Payer: Self-pay | Admitting: Internal Medicine

## 2016-06-03 MED ORDER — AZITHROMYCIN 250 MG PO TABS: ORAL_TABLET | ORAL | 0 refills | Status: DC

## 2016-06-03 NOTE — Telephone Encounter (Signed)
Spoke with pt, who reports of nasal drainage, watery eyes, R ear pain & headache x3-4d Pt states when brushing her teeth, she feels that her mouth doesn't open all the way on the on the right side. Pt feels that she may have a low grade temp, but she has not measured her temp.  CY please advise. Thanks.  Current Outpatient Prescriptions on File Prior to Visit  Medication Sig Dispense Refill  . butalbital-acetaminophen-caffeine (FIORICET, ESGIC) 50-325-40 MG per tablet Take 1 tablet by mouth every 6 (six) hours as needed. Headaches.  1  . esomeprazole (NEXIUM) 40 MG capsule Take 40 mg by mouth 2 (two) times daily.    Marland Kitchen HYDROcodone-acetaminophen (NORCO/VICODIN) 5-325 MG tablet Take 1-2 tablets by mouth every 4 (four) hours as needed for moderate pain.    Marland Kitchen levothyroxine (SYNTHROID, LEVOTHROID) 125 MCG tablet Take 1 tablet by mouth daily.  4  . Multiple Vitamin (MULTIVITAMIN) capsule Take 1 capsule by mouth every morning.     . traMADol (ULTRAM) 50 MG tablet Take 50 mg by mouth every 6 (six) hours as needed for moderate pain. Osteoarthritis pain Takes with Vicodin    . umeclidinium-vilanterol (ANORO ELLIPTA) 62.5-25 MCG/INH AEPB Inhale 1 puff into the lungs daily. 1 each 0   No current facility-administered medications on file prior to visit.     Allergies  Allergen Reactions  . Doxycycline Itching  . Levofloxacin     REACTION: GI upset, mouth/throat sores  . Penicillins     REACTION: itch

## 2016-06-03 NOTE — Telephone Encounter (Signed)
Ok to try Z pak   # 6, 250 mg,  2 today then one daily      Suggest also Sudafed from pharmacist

## 2016-06-03 NOTE — Telephone Encounter (Signed)
Called spoke with patient and discussed CY's recommendations with her Pt okay with these recs and voiced her understanding Pt aware to contact the office if her symptoms do not improve or they worsen Rx sent to verified pharmacy Nothing further needed; will sign off

## 2016-06-03 NOTE — Telephone Encounter (Signed)
Patient returned phone call. °

## 2016-06-03 NOTE — Telephone Encounter (Signed)
LMTCB

## 2016-07-02 ENCOUNTER — Ambulatory Visit
Admission: RE | Admit: 2016-07-02 | Discharge: 2016-07-02 | Disposition: A | Discharge status: Home / Self Care | Payer: Medicare Other | Source: Ambulatory Visit | Attending: Internal Medicine | Admitting: Internal Medicine

## 2016-07-02 DIAGNOSIS — Z1231 Encounter for screening mammogram for malignant neoplasm of breast: Secondary | ICD-10-CM

## 2016-11-13 ENCOUNTER — Telehealth: Payer: Self-pay | Admitting: Internal Medicine

## 2016-11-13 MED ORDER — AZITHROMYCIN 250 MG PO TABS: ORAL_TABLET | ORAL | 1 refills | Status: DC

## 2016-11-13 NOTE — Telephone Encounter (Signed)
Ok Z pak 250 mg, # 6, 2 today then one daily,     May ref x 1

## 2016-11-13 NOTE — Telephone Encounter (Signed)
Called and spoke with pt and she stated that for the last several weeks she has been having facial pain, headaches, increased mucus and pain around her eyes. This has become worse in the last couple of days. The mucus is now green in color and last night she did have body aches, chills and sweats.  She is requesting that a zpak be called in for her.  CY please advise. Thanks  Last ov--04/15/16 Next ov--04/15/17  Allergies  Allergen Reactions  . Doxycycline Itching  . Levofloxacin     REACTION: GI upset, mouth/throat sores  . Penicillins     REACTION: itch

## 2016-11-13 NOTE — Telephone Encounter (Signed)
Called and spoke with pt and she is aware of rx for the zpak that has been sent to the pharmacy. Nothing further is needed.

## 2017-04-01 ENCOUNTER — Telehealth: Payer: Self-pay | Admitting: Internal Medicine

## 2017-04-01 MED ORDER — AZITHROMYCIN 250 MG PO TABS: ORAL_TABLET | ORAL | 0 refills | Status: AC

## 2017-04-01 NOTE — Telephone Encounter (Signed)
Spoke with pt, c/o HA, sinus congestion/pressure, PND, runny nose, fatigue, chills.Notes that her mucus is clear to green, lightens up through the day.  Denies chest congestion, fever, chest pain. Has been taking BC powder to help witth s/s.  Requesting additional recs.   Uses [facility] on [address].   CY please advise on additional recs.  Thanks.

## 2017-04-01 NOTE — Telephone Encounter (Signed)
Offer Zpak 250 mg, # 6, 2 today then one daily  Mucinex-DM or something like a Tylenol Cold and Sinus type product might help

## 2017-04-01 NOTE — Telephone Encounter (Signed)
Pt is aware of below message and voiced her understanding.  Rx for zpak has been sent to preferred pharmacy. Nothing further is needed.   

## 2017-04-15 ENCOUNTER — Encounter: Payer: Self-pay | Admitting: Internal Medicine

## 2017-04-15 ENCOUNTER — Ambulatory Visit (INDEPENDENT_AMBULATORY_CARE_PROVIDER_SITE_OTHER): Payer: Medicare Other | Admitting: Internal Medicine

## 2017-04-15 VITALS — BP 138/84 | HR 74 | Ht 60.0 in | Wt 85.6 lb

## 2017-04-15 DIAGNOSIS — M545 Low back pain, unspecified: Secondary | ICD-10-CM | POA: Insufficient documentation

## 2017-04-15 DIAGNOSIS — G8929 Other chronic pain: Secondary | ICD-10-CM | POA: Diagnosis not present

## 2017-04-15 DIAGNOSIS — J439 Emphysema, unspecified: Secondary | ICD-10-CM | POA: Diagnosis not present

## 2017-04-15 DIAGNOSIS — F17200 Nicotine dependence, unspecified, uncomplicated: Secondary | ICD-10-CM | POA: Diagnosis not present

## 2017-04-15 NOTE — Assessment & Plan Note (Signed)
She says she has never inhaled, and thinks this is why she has relatively little objective evidence of lung disease.  She has little recognized wheeze, cough or dyspnea.  Previous trials with inhalers were ineffective and I very much doubt any would be  worth the cost now.

## 2017-04-15 NOTE — Progress Notes (Signed)
Patient ID: [name],[name] female    DOB: 1945-06-28, 72 y.o.   MRN: [id number]  HPI  female smoker followed for allergic rhinitis, recurrent bronchitis, complicated by history he GI bleed/anemia PFT 10/14/10-reviewed with her, showing mild/ moderate obstructive airways disease with insignificant response to bronchodilator, hyperinflation and air trapping, normal diffusion. FEV1/FVC 0.65. Office Spirometry- 11/30/13- Mild airway obstruction. FVC 2.81/ 116%, FEV1 1.49/ 80%, FEV1/FVC 0.53. ----------------------------------------------------------------------------------- 04/15/2016-72 year old female smoker followed for allergic rhinitis, recurrent bronchitis/ COPD, complicated by history GI bleed/anemia Follows For: COPD Still smoking without intention to stop. Feels very stable with no acute respiratory events this winter. Summer humidity tends to bother her most. She has no inhalers at all but doesn't notice routine significant cough or any wheeze. Pneumonia vaccine provided by her primary physician. Had flu shot this fall.  04/15/17- 72 year old female smoker followed for allergic rhinitis, recurrent bronchitis/ COPD, complicated by history GI bleed/anemia ----Pt doing well overall UTD flu vax She had a viral syndrome bronchitis during the winter and pleased just a little then with some cough but is "90% back to baseline" now with no routine cough or wheeze.  Walks 3 miles a day with her large dog.  Denies significant dyspnea.  Has no inhaled medications and does not feel she needs them.  Continues to smoke against advice. Chronic right low back pain she relates to her scoliosis, gradually getting worse. CXR 04/15/16 IMPRESSION: COPD without acute abnormality.  Review of Systems- see HPI + = positive Constitutional:   No-   weight loss, night sweats, fevers, chills, fatigue, lassitude. HEENT:     headaches, no-difficulty swallowing, tooth/dental problems, sore       throat,  No-  sneezing,  itching, ear ache,  +nasal congestion, +post nasal drip,  CV:  No-   chest pain, orthopnea, PND, swelling in lower extremities, anasarca, dizziness, palpitations Resp: little acute  shortness of breath with exertion or at rest.              No- productive cough,  No non-productive cough,  No-  coughing up of blood.              No-change in color of mucus.  No- wheezing.   Skin: No-   rash or lesions. GI:  No-   heartburn, indigestion, abdominal pain, nausea, vomiting, GU:  MS:  No-   joint pain or swelling.  + back pain Neuro- nothing unusual Psych:  No- change in mood or affect. No depression or anxiety.  No memory loss.  Objective:   Physical Exam General- Alert, Oriented, Affect-appropriate, Distress- none acute, petite + Skin- rash-none, lesions- none, excoriation- none. Normally pale complexion, thin Lymphadenopathy- none Head- atraumatic            Eyes- Gross vision intact, PERRLA, conjunctivae clear secretions                Ears- Hearing, canals normal            Nose- , no-Septal dev, mucus, polyps, erosion, perforation             Throat- Mallampati II , mucosa clear , drainage- none, tonsils- atrophic Neck- flexible , trachea midline, no stridor , thyroid nl, carotid no bruit Chest - symmetrical excursion , unlabored           Heart/CV- RRR , no murmur , no gallop  , no rub, nl s1 s2                           -  JVD- none , edema- none, stasis changes- none, varices- none           Lung- +decreased/ clear, wheeze- none, cough- none , dullness-none, rub- none               Chest wall-+ scoliosis. Abd- Br/ Gen/ Rectal- Not done, not indicated Extrem- +Arthritic deformities of fingers Neuro- grossly intact to observation

## 2017-04-15 NOTE — Assessment & Plan Note (Signed)
I again pressed her to make an effort to stop smoking.

## 2017-04-15 NOTE — Patient Instructions (Signed)
Order- CXR    Dx  COPD mixed type, right low back pain  Please think about stopping smoking, while you can.

## 2017-04-15 NOTE — Assessment & Plan Note (Signed)
Gradually worsening chronic right paraspinous low back pain, vaguely indicated around into the right flank area without sciatica or urinary symptoms.  Likely she is correct in attributing this to her scoliosis. Plan-I recommended she discuss low back pain with her PCP for possible referral

## 2017-10-25 ENCOUNTER — Telehealth: Payer: Self-pay | Admitting: Internal Medicine

## 2017-10-25 MED ORDER — AZITHROMYCIN 250 MG PO TABS: ORAL_TABLET | ORAL | 0 refills | Status: DC

## 2017-10-25 NOTE — Telephone Encounter (Signed)
Suggest a Zpak  250 mg, # 6, 2 today then one daily                    Sudafed from pharmacy counter as a decongestant                     Tylenol, ibuprofen or etc, as needed for pain                     Hot compress over the painful area may help

## 2017-10-25 NOTE — Telephone Encounter (Signed)
Pt is aware of below message and voiced her understanding.  Rx for zpak has been sent to preferred pharmacy. Nothing further is needed.   

## 2017-10-25 NOTE — Telephone Encounter (Signed)
Called and spoke to pt.  Pt reports of right sided facial discomfort, right ear is stopped up & head congestion x2d Pt feels that she may have a low garde temp, but has not measure temp.  Pt denied cough, wheezing, sob or chest discomfort. I have requested that pt contact pcp. Pt stated that she would perfer that CY address this matter.  Pt declined apt.   CY please advise. Thanks  Current Outpatient Medications on File Prior to Visit  Medication Sig Dispense Refill  . butalbital-acetaminophen-caffeine (FIORICET, ESGIC) 50-325-40 MG per tablet Take 1 tablet by mouth every 6 (six) hours as needed. Headaches.  1  . esomeprazole (NEXIUM) 40 MG capsule Take 40 mg by mouth 2 (two) times daily.    Marland Kitchen. HYDROcodone-acetaminophen (NORCO/VICODIN) 5-325 MG tablet Take 1-2 tablets by mouth every 4 (four) hours as needed for moderate pain.    Marland Kitchen. levothyroxine (SYNTHROID, LEVOTHROID) 125 MCG tablet Take 1 tablet by mouth daily.  4  . Multiple Vitamin (MULTIVITAMIN) capsule Take 1 capsule by mouth every morning.     . traMADol (ULTRAM) 50 MG tablet Take 50 mg by mouth every 6 (six) hours as needed for moderate pain. Osteoarthritis pain Takes with Vicodin     No current facility-administered medications on file prior to visit.     Allergies  Allergen Reactions  . Doxycycline Itching  . Levofloxacin     REACTION: GI upset, mouth/throat sores  . Penicillins     REACTION: itch

## 2017-11-02 ENCOUNTER — Other Ambulatory Visit: Payer: Self-pay | Admitting: Internal Medicine

## 2017-11-02 NOTE — Telephone Encounter (Signed)
Ok Zpak 250 mg, # 6, 2 today then 1 daily

## 2017-11-02 NOTE — Telephone Encounter (Signed)
CY Please advise on refill for abx. Thanks.

## 2018-01-11 ENCOUNTER — Other Ambulatory Visit: Payer: Self-pay | Admitting: Internal Medicine

## 2018-01-11 DIAGNOSIS — M810 Age-related osteoporosis without current pathological fracture: Secondary | ICD-10-CM

## 2018-01-11 DIAGNOSIS — Z1231 Encounter for screening mammogram for malignant neoplasm of breast: Secondary | ICD-10-CM

## 2018-03-07 ENCOUNTER — Ambulatory Visit
Admission: RE | Admit: 2018-03-07 | Discharge: 2018-03-07 | Disposition: A | Discharge status: Home / Self Care | Payer: Medicare Other | Source: Ambulatory Visit | Attending: Internal Medicine | Admitting: Internal Medicine

## 2018-03-07 ENCOUNTER — Encounter: Payer: Self-pay | Admitting: General Practice

## 2018-03-07 ENCOUNTER — Telehealth: Payer: Self-pay | Admitting: Internal Medicine

## 2018-03-07 DIAGNOSIS — M810 Age-related osteoporosis without current pathological fracture: Secondary | ICD-10-CM

## 2018-03-07 DIAGNOSIS — Z1231 Encounter for screening mammogram for malignant neoplasm of breast: Secondary | ICD-10-CM

## 2018-03-07 MED ORDER — AZITHROMYCIN 250 MG PO TABS: ORAL_TABLET | ORAL | 0 refills | Status: DC

## 2018-03-07 NOTE — Telephone Encounter (Signed)
error 

## 2018-03-07 NOTE — Telephone Encounter (Signed)
Ok Z pak   250 mg, # 6, 2 today then one daily 

## 2018-03-07 NOTE — Telephone Encounter (Signed)
Spoke with patient-states she feels like she has a sinus infection. Would like Rx sent to pharmacy as she is unable to come in for apt. CY Please advise. Thanks.

## 2018-03-07 NOTE — Telephone Encounter (Signed)
Spoke with pt. She is aware of CY's recommendation. Rx has been sent in. Nothing further was needed.  

## 2018-04-07 ENCOUNTER — Telehealth: Payer: Self-pay | Admitting: Internal Medicine

## 2018-04-07 MED ORDER — AZITHROMYCIN 250 MG PO TABS: ORAL_TABLET | ORAL | 0 refills | Status: DC

## 2018-04-07 MED ORDER — BENZONATATE 200 MG PO CAPS: 200.0000 mg | ORAL_CAPSULE | Freq: Three times a day (TID) | ORAL | 1 refills | Status: DC | PRN

## 2018-04-07 NOTE — Telephone Encounter (Signed)
Called and spoke with Patient. Dr Maple Hudson recommendations given.  Understanding stated.  Zpak prescription sent to pharmacy.  [facility] pharmacists stated that they do not carry Benzonatate Perles in 250mg , only 100mg  and 200mg .   Dr Maple Hudson, please advise on Benzonatate Perles

## 2018-04-07 NOTE — Telephone Encounter (Signed)
Ok to offer Zpak 250 mg, # 6, 2 today then one daily                    Benzonatate perles 250 mg, # 30, 1 ever  8 hours as needed for cough  Stay well hydrated

## 2018-04-07 NOTE — Telephone Encounter (Signed)
I miss-typed. Benzonatate is 200 mg, # 30, 1 every 8 hours as needed, ref x 1

## 2018-04-07 NOTE — Telephone Encounter (Signed)
Benzonatate 200mg , #30, take 1 every 8 hours, with 1 refill, sent to requested pharmacy. Patient aware.  Nothing further at this time.

## 2018-04-07 NOTE — Telephone Encounter (Signed)
Called and spoke with Patient. She is seen by Dr Maple Hudson for COPD, last OV was 04/15/17.  She is complaining of cough, that is mostly non productive, but occasionally has green phlegm. Patient stated that she gets coughing spells, where she can't stop coughing. She has headache, and low grade fever that has been 100-101.  She stated that it started Monday,and has gradually gotten worse. She has been taking Advil and Claritin.  She denied a OV, because she is feeling so bad, and didn't want to be around any other sick people at this time. She has requested prescriptions to be sent to [facility] at [place]. Message routed to Dr Maple Hudson  Allergies  Allergen Reactions  . Doxycycline Itching  . Levofloxacin     REACTION: GI upset, mouth/throat sores  . Penicillins     REACTION: itch  . Current Outpatient Medications on File Prior to Visit  Medication Sig Dispense Refill  . azithromycin (ZITHROMAX) 250 MG tablet TAKE 2 TABLETS BY MOUTH ON DAY 1, THEN TAKE 1 TABLET BY MOUTH DAILY FOR 4 DAYS 6 tablet 0  . butalbital-acetaminophen-caffeine (FIORICET, ESGIC) 50-325-40 MG per tablet Take 1 tablet by mouth every 6 (six) hours as needed. Headaches.  1  . esomeprazole (NEXIUM) 40 MG capsule Take 40 mg by mouth 2 (two) times daily.    Marland Kitchen HYDROcodone-acetaminophen (NORCO/VICODIN) 5-325 MG tablet Take 1-2 tablets by mouth every 4 (four) hours as needed for moderate pain.    Marland Kitchen levothyroxine (SYNTHROID, LEVOTHROID) 125 MCG tablet Take 1 tablet by mouth daily.  4  . Multiple Vitamin (MULTIVITAMIN) capsule Take 1 capsule by mouth every morning.     . traMADol (ULTRAM) 50 MG tablet Take 50 mg by mouth every 6 (six) hours as needed for moderate pain. Osteoarthritis pain Takes with Vicodin     No current facility-administered medications on file prior to visit.

## 2018-04-18 ENCOUNTER — Ambulatory Visit: Payer: Medicare Other | Admitting: Internal Medicine

## 2018-06-09 ENCOUNTER — Other Ambulatory Visit: Payer: Self-pay

## 2018-06-09 ENCOUNTER — Encounter: Payer: Self-pay | Admitting: Internal Medicine

## 2018-06-09 ENCOUNTER — Ambulatory Visit (INDEPENDENT_AMBULATORY_CARE_PROVIDER_SITE_OTHER): Payer: Medicare Other | Admitting: Internal Medicine

## 2018-06-09 DIAGNOSIS — J0101 Acute recurrent maxillary sinusitis: Secondary | ICD-10-CM

## 2018-06-09 DIAGNOSIS — F17200 Nicotine dependence, unspecified, uncomplicated: Secondary | ICD-10-CM | POA: Diagnosis not present

## 2018-06-09 DIAGNOSIS — J310 Chronic rhinitis: Secondary | ICD-10-CM

## 2018-06-09 MED ORDER — CEFDINIR 300 MG PO CAPS: ORAL_CAPSULE | ORAL | 0 refills | Status: DC

## 2018-06-09 MED ORDER — BUTALBITAL-APAP-CAFF-COD 50-300-40-30 MG PO CAPS: ORAL_CAPSULE | ORAL | 0 refills | Status: DC

## 2018-06-09 NOTE — Progress Notes (Signed)
Patient ID: Donell BeersSandra Kreh, female    DOB: [birth year], 73 y.o.   MRN: [id number]  HPI  female smoker followed for allergic rhinitis, recurrent bronchitis, complicated by history he GI bleed/anemia PFT 10/14/10-reviewed with her, showing mild/ moderate obstructive airways disease with insignificant response to bronchodilator, hyperinflation and air trapping, normal diffusion. FEV1/FVC 0.65. Office Spirometry- 11/30/13- Mild airway obstruction. FVC 2.81/ 116%, FEV1 1.49/ 80%, FEV1/FVC 0.53. ----------------------------------------------------------------------------------- 04/15/17- 73 year old female smoker followed for allergic rhinitis, recurrent bronchitis/ COPD, complicated by history GI bleed/anemia ----Pt doing well overall UTD flu vax She had a viral syndrome bronchitis during the winter and pleased just a little then with some cough but is "90% back to baseline" now with no routine cough or wheeze.  Walks 3 miles a day with her large dog.  Denies significant dyspnea.  Has no inhaled medications and does not feel she needs them.  Continues to smoke against advice. Chronic right low back pain she relates to her scoliosis, gradually getting worse. CXR 04/15/16 IMPRESSION: COPD without acute abnormality.  Review of Systems- see HPI + = positive Constitutional:   No-   weight loss, night sweats, fevers, chills, fatigue, lassitude. HEENT:     headaches, no-difficulty swallowing, tooth/dental problems, sore       throat,  No-  sneezing, itching, ear ache,  +nasal congestion, +post nasal drip,  CV:  No-   chest pain, orthopnea, PND, swelling in lower extremities, anasarca, dizziness, palpitations Resp: little acute  shortness of breath with exertion or at rest.              No- productive cough,  No non-productive cough,  No-  coughing up of blood.              No-change in color of mucus.  No- wheezing.   Skin: No-   rash or lesions. GI:  No-   heartburn, indigestion, abdominal pain, nausea,  vomiting, GU:  MS:  No-   joint pain or swelling.  + back pain Neuro- nothing unusual Psych:  No- change in mood or affect. No depression or anxiety.  No memory loss.  Objective:   Physical Exam General- Alert, Oriented, Affect-appropriate, Distress- none acute, petite + Skin- rash-none, lesions- none, excoriation- none. Normally pale complexion, thin Lymphadenopathy- none Head- atraumatic            Eyes- Gross vision intact, PERRLA, conjunctivae clear secretions                Ears- Hearing, canals normal            Nose- , no-Septal dev, mucus, polyps, erosion, perforation             Throat- Mallampati II , mucosa clear , drainage- none, tonsils- atrophic Neck- flexible , trachea midline, no stridor , thyroid nl, carotid no bruit Chest - symmetrical excursion , unlabored           Heart/CV- RRR , no murmur , no gallop  , no rub, nl s1 s2                           - JVD- none , edema- none, stasis changes- none, varices- none           Lung- +decreased/ clear, wheeze- none, cough- none , dullness-none, rub- none               Chest wall-+ scoliosis. Abd- Br/ Gen/ Rectal- Not done, not  indicated Extrem- +Arthritic deformities of fingers Neuro- grossly intact to observation   4/303/2020 Virtual Visit via Telephone Note  I connected with [name] on 06/09/18 at  4:00 PM EDT by telephone and verified that I am speaking with the correct person using two identifiers.  Location: Patient: home Provider: office   I discussed the limitations, risks, security and privacy concerns of performing an evaluation and management service by telephone and the availability of in person appointments. I also discussed with the patient that there may be a patient responsible charge related to this service. The patient expressed understanding and agreed to proceed.   History of Present Illness: PFT 10/14/10-reviewed with her, showing mild/ moderate obstructive airways disease with insignificant  response to bronchodilator, hyperinflation and air trapping, normal diffusion. FEV1/FVC 0.65. Office Spirometry- 11/30/13- Mild airway obstruction. FVC 2.81/ 116%, FEV1 1.49/ 80%, FEV1/FVC 0.53. ------breathing at baseline; having posterior headache, nasal & ear congestion, clear mucus for 1 mo, hoping CY will give an antibiotic & medication for this; taking med from PCP for tension headache, but states it is not helping bc it is not a tension headache Some discomfort behind right ear raising question of possible mastoiditis.  Denies fever.  Describes pain across the back of her head. Chronic headaches not responding well now to plain Fioricet. Codeine form used to work much better. Scant sputum is occasionally brown.  Still smoking 1/2 pack/day despite repeated counseling. PCN sometimes causes itching- discussed cephalosporins.    Observations/Objective: PFT 10/14/10-reviewed with her, showing mild/ moderate obstructive airways disease with insignificant response to bronchodilator, hyperinflation and air trapping, normal diffusion. FEV1/FVC 0.65. Office Spirometry- 11/30/13- Mild airway obstruction. FVC 2.81/ 116%, FEV1 1.49/ 80%, FEV1/FVC 0.53.  Assessment and Plan: Otitis and headache. Given cefdinir and Fioricet with codiene  Follow Up Instructions: 1 year   I discussed the assessment and treatment plan with the patient. The patient was provided an opportunity to ask questions and all were answered. The patient agreed with the plan and demonstrated an understanding of the instructions.   The patient was advised to call back or seek an in-person evaluation if the symptoms worsen or if the condition fails to improve as anticipated.  I provided 21 minutes of non-face-to-face time during this encounter.   Jetty Duhamel, MD

## 2018-06-09 NOTE — Patient Instructions (Addendum)
Script sent for Fioricet with codeine- use sparingly for headache  Script sent for cefdinir antibiotic- there is low incidence of cross allergy with penicillins  If the area near your ear keeps hurting, I suggest you see an ENT doctor  Remember- I expect these problems to do better if you can stop smoking

## 2018-06-19 ENCOUNTER — Encounter: Payer: Self-pay | Admitting: Internal Medicine

## 2018-06-19 NOTE — Assessment & Plan Note (Signed)
Covering possibility of an acute otitis media with cephaloporin,  Plan cefdinir, hydration

## 2018-06-19 NOTE — Assessment & Plan Note (Signed)
Treating as otitis media and maxillary sinusitis Plan Cefdinir, Fioricet with codeine. Drowsiness discussed.

## 2018-06-19 NOTE — Assessment & Plan Note (Signed)
I again pressed to to make an effort to quit. She isn't prepared to make changes.

## 2018-08-22 ENCOUNTER — Other Ambulatory Visit: Payer: Self-pay | Admitting: Internal Medicine

## 2018-08-22 MED ORDER — AZITHROMYCIN 250 MG PO TABS: ORAL_TABLET | ORAL | 0 refills | Status: DC

## 2018-08-22 NOTE — Progress Notes (Signed)
Zpak refill requested through drug store fax Refilled Zpak

## 2018-12-07 ENCOUNTER — Other Ambulatory Visit: Payer: Self-pay

## 2018-12-07 ENCOUNTER — Ambulatory Visit: Payer: Medicare Other | Admitting: Internal Medicine

## 2018-12-07 ENCOUNTER — Encounter: Payer: Self-pay | Admitting: Internal Medicine

## 2018-12-07 ENCOUNTER — Telehealth: Payer: Self-pay | Admitting: Internal Medicine

## 2018-12-07 ENCOUNTER — Ambulatory Visit (INDEPENDENT_AMBULATORY_CARE_PROVIDER_SITE_OTHER): Payer: Medicare Other | Admitting: Internal Medicine

## 2018-12-07 DIAGNOSIS — M19072 Primary osteoarthritis, left ankle and foot: Secondary | ICD-10-CM

## 2018-12-07 DIAGNOSIS — F17200 Nicotine dependence, unspecified, uncomplicated: Secondary | ICD-10-CM

## 2018-12-07 DIAGNOSIS — J439 Emphysema, unspecified: Secondary | ICD-10-CM | POA: Diagnosis not present

## 2018-12-07 MED ORDER — AZITHROMYCIN 250 MG PO TABS: ORAL_TABLET | ORAL | 0 refills | Status: DC

## 2018-12-07 MED ORDER — ACETAMINOPHEN-CODEINE #3 300-30 MG PO TABS: ORAL_TABLET | ORAL | 0 refills | Status: DC

## 2018-12-07 NOTE — Assessment & Plan Note (Signed)
Complains of chronic pain esp neck and back. Managed by her PCP Discussed possible use of otc ibuprofen

## 2018-12-07 NOTE — Assessment & Plan Note (Signed)
Importance of smoking cessation while she still can. Support and counseling.

## 2018-12-07 NOTE — Telephone Encounter (Signed)
Called and spoke to patient.  Patient stated she is running a low grad fever, having some congestion in chest that is causing pain in chest/side with a productive cough that is green in color.  Patient had canceled 10:30 appointment for today but upon speaking to patient rescheduled the 10:30 appointment as a telephone visit with Dr. Annamaria Boots.  Nothing further needed at this time.

## 2018-12-07 NOTE — Patient Instructions (Signed)
Script sent for Zpak antibiotic  Script sent for acetaminophen with codeine ("Tylenol # 3) - use sparingly for cough and pain  Can also try an otc arthritis pain relief NSAID product like ibuprofen  Stay comfortably hydrated  If you don't start feeling better in a few day please let us know.

## 2018-12-07 NOTE — Assessment & Plan Note (Addendum)
There is episodic acute bronchitis. PFTs have shown mild to moderate obstruction with little response to dilator  At baseline. Plan- Zpak. May have limited one time script for tylenol # 3 to help arthritis pain and cough

## 2018-12-07 NOTE — Progress Notes (Signed)
Patient ID: [name], female    DOB: [birth year], 73 y.o.   MRN: [id number]  HPI  female smoker followed for allergic rhinitis, asthmatic bronchitis/ COPD mixed type, complicated by history he GI bleed/anemia PFT 10/14/10-reviewed with her, showing mild/ moderate obstructive airways disease with insignificant response to bronchodilator, hyperinflation and air trapping, normal diffusion. FEV1/FVC 0.65. Office Spirometry- 11/30/13- Mild airway obstruction. FVC 2.81/ 116%, FEV1 1.49/ 80%, FEV1/FVC 0.53. ----------------------------------------------------------------------------------- 4/303/2020 Virtual Visit via Telephone Note History of Present Illness: PFT 10/14/10-reviewed with her, showing mild/ moderate obstructive airways disease with insignificant response to bronchodilator, hyperinflation and air trapping, normal diffusion. FEV1/FVC 0.65. Office Spirometry- 11/30/13- Mild airway obstruction. FVC 2.81/ 116%, FEV1 1.49/ 80%, FEV1/FVC 0.53. ------breathing at baseline; having posterior headache, nasal & ear congestion, clear mucus for 1 mo, hoping CY will give an antibiotic & medication for this; taking med from PCP for tension headache, but states it is not helping bc it is not a tension headache Some discomfort behind right ear raising question of possible mastoiditis.  Denies fever.  Describes pain across the back of her head. Chronic headaches not responding well now to plain Fioricet. Codeine form used to work much better. Scant sputum is occasionally brown.  Still smoking 1/2 pack/day despite repeated counseling. PCN sometimes causes itching- discussed cephalosporins.  Observations/Objective: PFT 10/14/10-reviewed with her, showing mild/ moderate obstructive airways disease with insignificant response to bronchodilator, hyperinflation and air trapping, normal diffusion. FEV1/FVC 0.65. Office Spirometry- 11/30/13- Mild airway obstruction. FVC 2.81/ 116%, FEV1 1.49/ 80%, FEV1/FVC  0.53. Assessment and Plan: Otitis and headache. Given cefdinir and Fioricet with codiene Follow Up Instructions: 1 year  12/07/2018-  Virtual Visit via Telephone Note  I connected with [name] on 12/07/18 at 10:30 AM EDT by telephone and verified that I am speaking with the correct person using two identifiers.  Location: Patient: H Provider: O   I discussed the limitations, risks, security and privacy concerns of performing an evaluation and management service by telephone and the availability of in person appointments. I also discussed with the patient that there may be a patient responsible charge related to this service. The patient expressed understanding and agreed to proceed.   History of Present Illness: 73 yo female smoker followed for allergic rhinitis, asthmatic bronchitis/ COPD mixed type complicated by history he GI bleed/anemia -----pt reports a low grad fever, chest congestion that is causing pain in chest/side and productive cough with green mucus Smoking 1/2 ppd, flu vax pending. Acute productive cough last few days, interfering w sleep. Asks help with cough until eligible to refill tramadol which is usually used for her arthritis. Sputum clear, no blood or chest pain.  No inhalers- trials had been ineffective in past.  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 Can also try ibuprofen. Med talk.  At next ov look again at role for inhaled meds. Tobacco abuse- Reinforce need to stop smoking. She has resisted. Follow Up Instructions: 6 months   I discussed the assessment and treatment plan with the patient. The patient was provided an opportunity to ask questions and all were answered. The patient agreed with the plan and demonstrated an understanding of the instructions.   The patient was advised to call back or seek an in-person evaluation  if the symptoms worsen or if the condition fails to improve as anticipated.  I provided 21 minutes of non-face-to-face time during this  encounter.   Jetty Duhamel, MD        Review of Systems- see HPI + = positive Constitutional:   No-   weight loss, night sweats, fevers, chills, fatigue, lassitude. HEENT:     headaches, no-difficulty swallowing, tooth/dental problems, sore       throat,  No-  sneezing, itching, ear ache,  +nasal congestion, +post nasal drip,  CV:  No-   chest pain, orthopnea, PND, swelling in lower extremities, anasarca, dizziness, palpitations Resp: little acute  shortness of breath with exertion or at rest.              No- productive cough,  No non-productive cough,  No-  coughing up of blood.              No-change in color of mucus.  No- wheezing.   Skin: No-   rash or lesions. GI:  No-   heartburn, indigestion, abdominal pain, nausea, vomiting, GU:  MS:  No-   joint pain or swelling.  + back pain Neuro- nothing unusual Psych:  No- change in mood or affect. No depression or anxiety.  No memory loss.  Objective:   Physical Exam General- Alert, Oriented, Affect-appropriate, Distress- none acute, petite + Skin- rash-none, lesions- none, excoriation- none. Normally pale complexion, thin Lymphadenopathy- none Head- atraumatic            Eyes- Gross vision intact, PERRLA, conjunctivae clear secretions                Ears- Hearing, canals normal            Nose- , no-Septal dev, mucus, polyps, erosion, perforation             Throat- Mallampati II , mucosa clear , drainage- none, tonsils- atrophic Neck- flexible , trachea midline, no stridor , thyroid nl, carotid no bruit Chest - symmetrical excursion , unlabored           Heart/CV- RRR , no murmur , no gallop  , no rub, nl s1 s2                           - JVD- none , edema- none, stasis changes- none, varices- none           Lung- +decreased/ clear, wheeze- none, cough- none , dullness-none, rub- none                Chest wall-+ scoliosis. Abd- Br/ Gen/ Rectal- Not done, not indicated Extrem- +Arthritic deformities of fingers Neuro- grossly intact to observation

## 2019-01-23 ENCOUNTER — Ambulatory Visit: Payer: Medicare Other | Admitting: Internal Medicine

## 2019-03-12 ENCOUNTER — Ambulatory Visit: Payer: Medicare Other | Attending: Internal Medicine

## 2019-03-12 DIAGNOSIS — Z23 Encounter for immunization: Secondary | ICD-10-CM | POA: Insufficient documentation

## 2019-03-12 NOTE — Progress Notes (Signed)
   Covid-19 Vaccination Clinic  Name:  [name]    MRN: [id number] DOB: Oct 20, 1945  03/12/2019  [name] was observed post Covid-19 immunization for 15 minutes  without incidence. She was provided with Vaccine Information Sheet and instruction to access the V-Safe system.   [name] was instructed to call 911 with any severe reactions post vaccine: Marland Kitchen Difficulty breathing  . Swelling of your face and throat  . A fast heartbeat  . A bad rash all over your body  . Dizziness and weakness    Immunizations Administered    Name Date Dose VIS Date Route   Pfizer COVID-19 Vaccine 03/12/2019  2:20 PM 0.3 mL 01/20/2019 Intramuscular   Manufacturer: [employer], [employer]   Lot: [id number]   NDC: [address]-1

## 2019-04-03 ENCOUNTER — Ambulatory Visit: Payer: Medicare Other

## 2019-04-06 ENCOUNTER — Ambulatory Visit: Payer: Medicare Other | Attending: Internal Medicine

## 2019-04-06 DIAGNOSIS — Z23 Encounter for immunization: Secondary | ICD-10-CM

## 2019-04-06 NOTE — Progress Notes (Signed)
   Covid-19 Vaccination Clinic  Name:  [name]    MRN: [id number] DOB: 04-15-1945  04/06/2019  [name] was observed post Covid-19 immunization for 15 minutes without incidence. She was provided with Vaccine Information Sheet and instruction to access the V-Safe system.   [name] was instructed to call 911 with any severe reactions post vaccine: Marland Kitchen Difficulty breathing  . Swelling of your face and throat  . A fast heartbeat  . A bad rash all over your body  . Dizziness and weakness    Immunizations Administered    Name Date Dose VIS Date Route   Pfizer COVID-19 Vaccine 04/06/2019 10:45 AM 0.3 mL 01/20/2019 Intramuscular   Manufacturer: [employer], [employer]   Lot: [id number]   NDC: [address]-5

## 2019-05-04 ENCOUNTER — Telehealth: Payer: Self-pay | Admitting: Internal Medicine

## 2019-05-04 MED ORDER — PROMETHAZINE-CODEINE 6.25-10 MG/5ML PO SYRP: 5.0000 mL | ORAL_SOLUTION | Freq: Four times a day (QID) | ORAL | 0 refills | Status: DC | PRN

## 2019-05-04 MED ORDER — AZITHROMYCIN 250 MG PO TABS: ORAL_TABLET | ORAL | 0 refills | Status: DC

## 2019-05-04 NOTE — Telephone Encounter (Signed)
Spoke with the pt and notified that the rxs were sent

## 2019-05-04 NOTE — Telephone Encounter (Signed)
Scripts e-sent

## 2019-05-04 NOTE — Telephone Encounter (Signed)
Spoke with patient. She developed a fever last night of 101F. She states she has been struggling with her allergies for the past 3 days. She has a productive cough with green phlegm. Denied any body aches or chest pain. She has not been around anyone with COVID and has received both of her vaccines.   She is requesting a zpak and cough syrup with codeine. She stated that she had to tried to use Tessalon perles before in the past but they did not work.  Offered her a televisit but she refused.   Pharmacy is [facility] on [address] and [address].   CY, please advise. Thanks!

## 2019-06-02 ENCOUNTER — Other Ambulatory Visit: Payer: Self-pay | Admitting: Internal Medicine

## 2019-06-02 MED ORDER — PROMETHAZINE-CODEINE 6.25-10 MG/5ML PO SYRP: 5.0000 mL | ORAL_SOLUTION | Freq: Four times a day (QID) | ORAL | 0 refills | Status: DC | PRN

## 2020-05-20 ENCOUNTER — Telehealth: Payer: Self-pay | Admitting: Internal Medicine

## 2020-05-20 MED ORDER — AZITHROMYCIN 250 MG PO TABS: ORAL_TABLET | ORAL | 0 refills | Status: DC

## 2020-05-20 NOTE — Telephone Encounter (Signed)
Left message for patient to call back  

## 2020-05-20 NOTE — Telephone Encounter (Signed)
Called and spoke with patient. She verbalized understanding. Zpak has been sent into her pharmacy.   Nothing further needed at time of call.

## 2020-05-20 NOTE — Telephone Encounter (Signed)
Suggest Zpak 250 mg, # 6, 2 today then one daily  Consider otc chlortrimeton antihistamine- better for drying, but may make her sleepy.  I will see her if no better

## 2020-05-20 NOTE — Telephone Encounter (Signed)
Pt returning a phone call. Pt can be reached at [phone].

## 2020-05-20 NOTE — Telephone Encounter (Signed)
Primary Pulmonologist: CY  Last office visit and with whom: 12/07/2018 What do we see them for (pulmonary problems): emphysema  Last OV assessment/plan:  Return in about 6 months (around 06/07/2019). Script sent for Zpak antibiotic  Script sent for acetaminophen with codeine ("Tylenol # 3) - use sparingly for cough and pain  Can also try an otc arthritis pain relief NSAID product like ibuprofen  Stay comfortably hydrated  If you don't start feeling better in a few day please let us know.        Was appointment offered to patient (explain)?  Patient wanted recommendations. I did make her aware that CY may want her to get scheduled for a follow up once she is feeling better and fever free for 24 hours. She verbalized understanding.    Reason for call: Spoke with patient. She stated that she has been bothered by allergies for the past week. She has been taking OTC Claritin without any relief. She started running fevers yesterday as well as a sore throat. She believes her throat is sore from post nasal drip since she can feel the drainage in the back of her throat. When she coughs, the phlegm has been clear. She also has a runny nose with clear discharge. She denies any body aches. She also denied being around anyone recently who has been sick. She has received 2 Pfizer covid vaccines.   (examples of things to ask: : When did symptoms start? Fever? Cough? Productive? Color to sputum? More sputum than usual? Wheezing? Have you needed increased oxygen? Are you taking your respiratory medications? What over the counter measures have you tried?)  Allergies  Allergen Reactions  . Doxycycline Itching  . Levofloxacin     REACTION: GI upset, mouth/throat sores  . Penicillins     REACTION: itch    Immunization History  Administered Date(s) Administered  . Influenza Split 12/11/2011, 12/19/2014, 12/11/2015, 12/24/2016  . Influenza Whole 11/05/2008, 12/20/2009, 12/11/2010  .  Influenza-Unspecified 12/10/2012, 01/08/2018  . PFIZER(Purple Top)SARS-COV-2 Vaccination 03/12/2019, 04/06/2019   Pharmacy is [facility] on [address].   CY, can you please advise? Thanks.

## 2020-06-06 ENCOUNTER — Telehealth: Payer: Self-pay | Admitting: Internal Medicine

## 2020-06-06 ENCOUNTER — Other Ambulatory Visit: Payer: Self-pay | Admitting: Internal Medicine

## 2020-06-06 MED ORDER — PROMETHAZINE-CODEINE 6.25-10 MG/5ML PO SYRP: 5.0000 mL | ORAL_SOLUTION | Freq: Four times a day (QID) | ORAL | 0 refills | Status: DC | PRN

## 2020-06-06 MED ORDER — AZITHROMYCIN 250 MG PO TABS: 250.0000 mg | ORAL_TABLET | ORAL | 0 refills | Status: DC

## 2020-06-06 NOTE — Telephone Encounter (Signed)
Called and spoke with the pt  She is c/o sore throat off and on x 3 days and cough with green sputum  She states she has had temp of 100-101 off and on as well  Denies any increased SOB, wheezing, chest tightness  She is requesting meds be called in  Has had covid vax x 3 and has not taken at at home test She has not been seen since Oct 2020 and has had meds called in for her 05/20/20 and 05/03/20- zpack and prometh  Can we please use RN slot for her?

## 2020-06-06 NOTE — Telephone Encounter (Signed)
Codeine cough syrup sent to her pharmacy

## 2020-06-06 NOTE — Telephone Encounter (Signed)
Spoke with the pt and notified of response per CDY  Pt verbalized understanding  She likes Tues am for appts and so I scheduled her for 06/11/20 at 11:30 with Dr Kreg Shropshire was sent  She does request the prometh/codeine syrup- please send to pharm on file, thank you!

## 2020-06-06 NOTE — Telephone Encounter (Signed)
Pt.notified

## 2020-06-06 NOTE — Telephone Encounter (Signed)
Yes - please get her an RN slot with me or see APP in next week or 2.   Pending that, we can send Zpak. If she needs cough syrup, LMK and I will send that.  Thanks

## 2020-06-10 NOTE — Progress Notes (Deleted)
Patient ID: [name], female    DOB: Nov 21, 1945, 75 y.o.   MRN: [id number]  HPI  female smoker followed for allergic rhinitis, asthmatic bronchitis/ COPD mixed type, complicated by history he GI bleed/anemia PFT 10/14/10-reviewed with her, showing mild/ moderate obstructive airways disease with insignificant response to bronchodilator, hyperinflation and air trapping, normal diffusion. FEV1/FVC 0.65. Office Spirometry- 11/30/13- Mild airway obstruction. FVC 2.81/ 116%, FEV1 1.49/ 80%, FEV1/FVC 0.53. -----------------------------------------------------------------------------------  12/07/2018-  Virtual Visit via Telephone Note  History of Present Illness: 75 yo female smoker followed for allergic rhinitis, asthmatic bronchitis/ COPD mixed type complicated by history he GI bleed/anemia -----pt reports a low grad fever, chest congestion that is causing pain in chest/side and productive cough with green mucus Smoking 1/2 ppd, flu vax pending. Acute productive cough last few days, interfering w sleep. Asks help with cough until eligible to refill tramadol which is usually used for her arthritis. Sputum clear, no blood or chest pain.  No inhalers- trials had been ineffective in past.  Observations/Objective:  Assessment and Plan: COPD mixed type- asthma COPD overlap with exacerbation.-Plan- Zpak, Tylenol w codeine for arthritis pain and cough. Can also try ibuprofen. Med talk.  At next ov look again at role for inhaled meds. Tobacco abuse- Reinforce need to stop smoking. She has resisted. Follow Up Instructions: 6 months   06/11/20- 76 yo female smoker followed for allergic rhinitis, asthmatic bronchitis/ COPD mixed type complicated by history he GI bleed/anemia We sent Zpak, prometh-codeine cough syrup 4/28. Covid vax-      Review of Systems- see HPI + = positive Constitutional:   No-   weight loss, night sweats, fevers, chills, fatigue, lassitude. HEENT:     headaches,  no-difficulty swallowing, tooth/dental problems, sore       throat,  No-  sneezing, itching, ear ache,  +nasal congestion, +post nasal drip,  CV:  No-   chest pain, orthopnea, PND, swelling in lower extremities, anasarca, dizziness, palpitations Resp: little acute  shortness of breath with exertion or at rest.              No- productive cough,  No non-productive cough,  No-  coughing up of blood.              No-change in color of mucus.  No- wheezing.   Skin: No-   rash or lesions. GI:  No-   heartburn, indigestion, abdominal pain, nausea, vomiting, GU:  MS:  No-   joint pain or swelling.  + back pain Neuro- nothing unusual Psych:  No- change in mood or affect. No depression or anxiety.  No memory loss.  Objective:   Physical Exam General- Alert, Oriented, Affect-appropriate, Distress- none acute, petite + Skin- rash-none, lesions- none, excoriation- none. Normally pale complexion, thin Lymphadenopathy- none Head- atraumatic            Eyes- Gross vision intact, PERRLA, conjunctivae clear secretions                Ears- Hearing, canals normal            Nose- , no-Septal dev, mucus, polyps, erosion, perforation             Throat- Mallampati II , mucosa clear , drainage- none, tonsils- atrophic Neck- flexible , trachea midline, no stridor , thyroid nl, carotid no bruit Chest - symmetrical excursion , unlabored           Heart/CV- RRR , no murmur , no gallop  , no  rub, nl s1 s2                           - JVD- none , edema- none, stasis changes- none, varices- none           Lung- +decreased/ clear, wheeze- none, cough- none , dullness-none, rub- none               Chest wall-+ scoliosis. Abd- Br/ Gen/ Rectal- Not done, not indicated Extrem- +Arthritic deformities of fingers Neuro- grossly intact to observation

## 2020-06-11 ENCOUNTER — Ambulatory Visit: Payer: Medicare Other | Admitting: Internal Medicine

## 2020-07-03 NOTE — Progress Notes (Deleted)
Patient ID: [name], female    DOB: [birth year], 75 y.o.   MRN: [id number]  HPI  female smoker followed for allergic rhinitis, asthmatic bronchitis/ COPD mixed type, complicated by history he GI bleed/anemia PFT 10/14/10-reviewed with her, showing mild/ moderate obstructive airways disease with insignificant response to bronchodilator, hyperinflation and air trapping, normal diffusion. FEV1/FVC 0.65. Office Spirometry- 11/30/13- Mild airway obstruction. FVC 2.81/ 116%, FEV1 1.49/ 80%, FEV1/FVC 0.53. -----------------------------------------------------------------------------------  12/07/2018-  Virtual Visit via Telephone Note  History of Present Illness: 75 yo female smoker followed for allergic rhinitis, asthmatic bronchitis/ COPD mixed type complicated by history he GI bleed/anemia -----pt reports a low grad fever, chest congestion that is causing pain in chest/side and productive cough with green mucus Smoking 1/2 ppd, flu vax pending. Acute productive cough last few days, interfering w sleep. Asks help with cough until eligible to refill tramadol which is usually used for her arthritis. Sputum clear, no blood or chest pain.  No inhalers- trials had been ineffective in past.  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 Can also try ibuprofen. Med talk.  At next ov look again at role for inhaled meds. Tobacco abuse- Reinforce need to stop smoking. She has resisted. Follow Up Instructions: 6 months   07/04/20- 75 yo female Smoker followed for allergic rhinitis, asthmatic bronchitis/ COPD mixed type complicated by history he GI bleed/anemia Multiple pain meds, prometh codeine cough syrup, tramadol, tesalon  No inhalers Got Zpak in April Chronic pain- Dr Donette Larry   Review of Systems- see HPI + = positive Constitutional:   No-   weight loss, night sweats,  fevers, chills, fatigue, lassitude. HEENT:     headaches, no-difficulty swallowing, tooth/dental problems, sore       throat,  No-  sneezing, itching, ear ache,  +nasal congestion, +post nasal drip,  CV:  No-   chest pain, orthopnea, PND, swelling in lower extremities, anasarca, dizziness, palpitations Resp: little acute  shortness of breath with exertion or at rest.              No- productive cough,  No non-productive cough,  No-  coughing up of blood.              No-change in color of mucus.  No- wheezing.   Skin: No-   rash or lesions. GI:  No-   heartburn, indigestion, abdominal pain, nausea, vomiting, GU:  MS:  No-   joint pain or swelling.  + back pain Neuro- nothing unusual Psych:  No- change in mood or affect. No depression or anxiety.  No memory loss.  Objective:   Physical Exam General- Alert, Oriented, Affect-appropriate, Distress- none acute, petite + Skin- rash-none, lesions- none, excoriation- none. Normally pale complexion, thin Lymphadenopathy- none Head- atraumatic            Eyes- Gross vision intact, PERRLA, conjunctivae clear secretions                Ears- Hearing, canals normal            Nose- , no-Septal dev, mucus, polyps, erosion, perforation             Throat- Mallampati II , mucosa clear , drainage- none, tonsils- atrophic Neck- flexible , trachea midline, no stridor , thyroid nl, carotid no bruit Chest - symmetrical excursion , unlabored           Heart/CV- RRR ,  no murmur , no gallop  , no rub, nl s1 s2                           - JVD- none , edema- none, stasis changes- none, varices- none           Lung- +decreased/ clear, wheeze- none, cough- none , dullness-none, rub- none               Chest wall-+ scoliosis. Abd- Br/ Gen/ Rectal- Not done, not indicated Extrem- +Arthritic deformities of fingers Neuro- grossly intact to observation

## 2020-07-04 ENCOUNTER — Ambulatory Visit: Payer: Medicare Other | Admitting: Internal Medicine

## 2020-07-05 DIAGNOSIS — E782 Mixed hyperlipidemia: Secondary | ICD-10-CM | POA: Insufficient documentation

## 2020-07-05 DIAGNOSIS — G43909 Migraine, unspecified, not intractable, without status migrainosus: Secondary | ICD-10-CM | POA: Insufficient documentation

## 2020-07-05 DIAGNOSIS — M810 Age-related osteoporosis without current pathological fracture: Secondary | ICD-10-CM | POA: Insufficient documentation

## 2020-07-05 DIAGNOSIS — R143 Flatulence: Secondary | ICD-10-CM | POA: Insufficient documentation

## 2020-07-05 DIAGNOSIS — M5090 Cervical disc disorder, unspecified, unspecified cervical region: Secondary | ICD-10-CM | POA: Insufficient documentation

## 2020-07-05 DIAGNOSIS — K219 Gastro-esophageal reflux disease without esophagitis: Secondary | ICD-10-CM | POA: Insufficient documentation

## 2020-07-05 DIAGNOSIS — D509 Iron deficiency anemia, unspecified: Secondary | ICD-10-CM | POA: Insufficient documentation

## 2020-07-05 DIAGNOSIS — J449 Chronic obstructive pulmonary disease, unspecified: Secondary | ICD-10-CM | POA: Insufficient documentation

## 2020-07-05 DIAGNOSIS — K279 Peptic ulcer, site unspecified, unspecified as acute or chronic, without hemorrhage or perforation: Secondary | ICD-10-CM | POA: Insufficient documentation

## 2020-07-05 DIAGNOSIS — F419 Anxiety disorder, unspecified: Secondary | ICD-10-CM | POA: Insufficient documentation

## 2020-07-05 DIAGNOSIS — G894 Chronic pain syndrome: Secondary | ICD-10-CM | POA: Insufficient documentation

## 2020-07-05 DIAGNOSIS — K311 Adult hypertrophic pyloric stenosis: Secondary | ICD-10-CM | POA: Insufficient documentation

## 2020-07-05 DIAGNOSIS — E039 Hypothyroidism, unspecified: Secondary | ICD-10-CM | POA: Insufficient documentation

## 2020-07-05 DIAGNOSIS — M154 Erosive (osteo)arthritis: Secondary | ICD-10-CM | POA: Insufficient documentation

## 2020-07-05 DIAGNOSIS — Z934 Other artificial openings of gastrointestinal tract status: Secondary | ICD-10-CM | POA: Insufficient documentation

## 2020-07-05 DIAGNOSIS — J309 Allergic rhinitis, unspecified: Secondary | ICD-10-CM | POA: Insufficient documentation

## 2020-07-05 DIAGNOSIS — D638 Anemia in other chronic diseases classified elsewhere: Secondary | ICD-10-CM | POA: Insufficient documentation

## 2020-07-05 DIAGNOSIS — M158 Other polyosteoarthritis: Secondary | ICD-10-CM | POA: Insufficient documentation

## 2020-07-05 DIAGNOSIS — R141 Gas pain: Secondary | ICD-10-CM | POA: Insufficient documentation

## 2020-07-06 NOTE — Progress Notes (Signed)
Patient ID: [name], female    DOB: 16-Dec-1945, 75 y.o.   MRN: [id number]  HPI  female smoker followed for allergic rhinitis, asthmatic bronchitis/ COPD mixed type, complicated by history he GI bleed/anemia PFT 10/14/10-reviewed with her, showing mild/ moderate obstructive airways disease with insignificant response to bronchodilator, hyperinflation and air trapping, normal diffusion. FEV1/FVC 0.65. Office Spirometry- 11/30/13- Mild airway obstruction. FVC 2.81/ 116%, FEV1 1.49/ 80%, FEV1/FVC 0.53. -----------------------------------------------------------------------------------  12/07/2018-  Virtual Visit via Telephone Note History of Present Illness: 75 yo female smoker followed for allergic rhinitis, asthmatic bronchitis/ COPD mixed type complicated by history he GI bleed/anemia -----pt reports a low grad fever, chest congestion that is causing pain in chest/side and productive cough with green mucus Smoking 1/2 ppd, flu vax pending. Acute productive cough last few days, interfering w sleep. Asks help with cough until eligible to refill tramadol which is usually used for her arthritis. Sputum clear, no blood or chest pain.  No inhalers- trials had been ineffective in past.  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 Can also try ibuprofen. Med talk.  At next ov look again at role for inhaled meds. Tobacco abuse- Reinforce need to stop smoking. She has resisted. Follow Up Instructions: 6 months    07/09/20- 75 yo female Smoker followed for Allergic Rhinitis, asthmatic bronchitis/ COPD mixed type complicated by history of UGI bleed/anemia, GERD, Hypothyroid, Osteoarthritis, Tobacco abuse, Protein-Calorie malnutrition, Chronic Pain, Anemia Chronic Disease,  -No listed inhalers Body weight today-84 lbs Last CXR 2018 Covid vax- [employer] -----Allergies with runny  nose. We sent Zpak and cough med in April. She indicated that resolved and she is at baseline now. Activity mainly limited by diffuse arthritis.  Scant clear phlegm about once daily. Not much cough or wheeze.  Edentulous after extractions last year. Dislikes her dentures.   Review of Systems- see HPI + = positive Constitutional:   No-   weight loss, night sweats, fevers, chills, fatigue, lassitude. HEENT:     headaches, no-difficulty swallowing, tooth/dental problems, sore       throat,  No-  sneezing, itching, ear ache,  +nasal congestion, +post nasal drip,  CV:  No-   chest pain, orthopnea, PND, swelling in lower extremities, anasarca, dizziness, palpitations Resp:+ little acute  shortness of breath with exertion or at rest.              + productive cough,  No non-productive cough,  No-  coughing up of blood.              No-change in color of mucus.  No- wheezing.   Skin: No-   rash or lesions. GI:  No-   heartburn, indigestion, abdominal pain, nausea, vomiting, GU:  MS:  + joint pain or swelling.  + back pain Neuro- nothing unusual Psych:  No- change in mood or affect. No depression or anxiety.  No memory loss.  Objective:   Physical Exam General- Alert, Oriented, Affect-appropriate, Distress- none acute, petite + Skin- rash-none, lesions- none, excoriation- none. Normally pale complexion,+ thin Lymphadenopathy- none Head- atraumatic            Eyes- Gross vision intact, PERRLA, conjunctivae clear secretions                Ears- Hearing, canals normal            Nose- , no-Septal dev, mucus, polyps, erosion, perforation  Throat- Mallampati II , mucosa clear , drainage- none, tonsils- atrophic Neck- flexible , trachea midline, no stridor , thyroid nl, carotid no bruit Chest - symmetrical excursion , unlabored           Heart/CV- RRR , no murmur , no gallop  , no rub, nl s1 s2                           - JVD- none , edema- none, stasis changes- none, varices- none            Lung- +decreased/ clear, wheeze- none, cough- none , dullness-none, rub- none               Chest wall-+ scoliosis. Abd- Br/ Gen/ Rectal- Not done, not indicated Extrem- +Arthritic deformities of fingers Neuro- grossly intact to observation

## 2020-07-09 ENCOUNTER — Other Ambulatory Visit: Payer: Self-pay

## 2020-07-09 ENCOUNTER — Ambulatory Visit (INDEPENDENT_AMBULATORY_CARE_PROVIDER_SITE_OTHER): Payer: Medicare Other

## 2020-07-09 ENCOUNTER — Ambulatory Visit (INDEPENDENT_AMBULATORY_CARE_PROVIDER_SITE_OTHER): Payer: Medicare Other | Admitting: Internal Medicine

## 2020-07-09 ENCOUNTER — Encounter: Payer: Self-pay | Admitting: Internal Medicine

## 2020-07-09 VITALS — BP 170/60 | HR 87 | Temp 98.7°F | Ht 62.0 in | Wt 84.0 lb

## 2020-07-09 DIAGNOSIS — F17200 Nicotine dependence, unspecified, uncomplicated: Secondary | ICD-10-CM

## 2020-07-09 DIAGNOSIS — J439 Emphysema, unspecified: Secondary | ICD-10-CM

## 2020-07-09 MED ORDER — TRELEGY ELLIPTA 100-62.5-25 MCG/INH IN AEPB: 1.0000 | INHALATION_SPRAY | Freq: Every day | RESPIRATORY_TRACT | 0 refills | Status: DC

## 2020-07-09 NOTE — Assessment & Plan Note (Signed)
Mostly emphysema. Physical activty is very limited so she isn't much impacted by dyspnea. Plan- CXR, sample trial Trelegy 100 with discussion

## 2020-07-09 NOTE — Assessment & Plan Note (Signed)
I again brought up smoking and importance of quitting. Support available.

## 2020-07-09 NOTE — Patient Instructions (Signed)
Order- CXR    Dx  Tobacco user  Order- sample x 2 Trelegy 100    Inhale 1 puff then rinse mouth, once daily  See if this helps with shortness of breath and cough  We continue to hope you will stop smoking  Please call if we can help

## 2020-07-11 ENCOUNTER — Encounter: Payer: Self-pay | Admitting: *Deleted

## 2020-07-25 ENCOUNTER — Telehealth: Payer: Self-pay | Admitting: Internal Medicine

## 2020-07-25 MED ORDER — TRELEGY ELLIPTA 100-62.5-25 MCG/INH IN AEPB: 1.0000 | INHALATION_SPRAY | Freq: Every day | RESPIRATORY_TRACT | 11 refills | Status: DC

## 2020-07-25 NOTE — Telephone Encounter (Signed)
Spoke with pt  Trelegy 100 working well for her  Rx sent to preferred pharm  Nothing further needed

## 2020-08-03 ENCOUNTER — Telehealth: Payer: Self-pay | Admitting: Pulmonary Disease

## 2020-08-03 ENCOUNTER — Other Ambulatory Visit: Payer: Self-pay | Admitting: Pulmonary Disease

## 2020-08-03 MED ORDER — AZITHROMYCIN 250 MG PO TABS: ORAL_TABLET | ORAL | 0 refills | Status: DC

## 2020-08-03 NOTE — Telephone Encounter (Signed)
Patient with sinus fullness and congestion  Feels she may be getting a sinus infection  Azithromycin called in

## 2020-08-03 NOTE — Progress Notes (Signed)
Called in with sinus fullness  Azithromycin called in Cepacol for throat soreness, ibuprofen for headache

## 2020-08-03 NOTE — Telephone Encounter (Signed)
Patient called answering service  I did place a call back to her-went to voicemail

## 2020-08-13 ENCOUNTER — Other Ambulatory Visit: Payer: Self-pay | Admitting: Internal Medicine

## 2020-08-14 ENCOUNTER — Other Ambulatory Visit: Payer: Self-pay | Admitting: Internal Medicine

## 2020-10-09 ENCOUNTER — Ambulatory Visit
Admission: RE | Admit: 2020-10-09 | Discharge: 2020-10-09 | Disposition: A | Discharge status: Home / Self Care | Payer: Medicare Other | Source: Ambulatory Visit | Attending: Internal Medicine | Admitting: Internal Medicine

## 2020-10-09 ENCOUNTER — Other Ambulatory Visit: Payer: Self-pay

## 2020-10-09 ENCOUNTER — Other Ambulatory Visit: Payer: Self-pay | Admitting: Internal Medicine

## 2020-10-09 DIAGNOSIS — S0990XA Unspecified injury of head, initial encounter: Secondary | ICD-10-CM

## 2020-10-10 ENCOUNTER — Other Ambulatory Visit: Payer: Self-pay | Admitting: Internal Medicine

## 2020-10-10 ENCOUNTER — Emergency Department (HOSPITAL_COMMUNITY): Payer: Medicare Other

## 2020-10-10 ENCOUNTER — Emergency Department (HOSPITAL_COMMUNITY)
Admission: EM | Admit: 2020-10-10 | Discharge: 2020-10-10 | Disposition: A | Discharge status: Home / Self Care | Payer: Medicare Other | Attending: Emergency Medicine | Admitting: Emergency Medicine

## 2020-10-10 ENCOUNTER — Encounter (HOSPITAL_COMMUNITY): Payer: Self-pay | Admitting: Emergency Medicine

## 2020-10-10 DIAGNOSIS — J449 Chronic obstructive pulmonary disease, unspecified: Secondary | ICD-10-CM | POA: Insufficient documentation

## 2020-10-10 DIAGNOSIS — J45909 Unspecified asthma, uncomplicated: Secondary | ICD-10-CM | POA: Insufficient documentation

## 2020-10-10 DIAGNOSIS — Z79899 Other long term (current) drug therapy: Secondary | ICD-10-CM | POA: Diagnosis not present

## 2020-10-10 DIAGNOSIS — E039 Hypothyroidism, unspecified: Secondary | ICD-10-CM | POA: Diagnosis not present

## 2020-10-10 DIAGNOSIS — M810 Age-related osteoporosis without current pathological fracture: Secondary | ICD-10-CM

## 2020-10-10 DIAGNOSIS — S0990XA Unspecified injury of head, initial encounter: Secondary | ICD-10-CM

## 2020-10-10 DIAGNOSIS — Z7951 Long term (current) use of inhaled steroids: Secondary | ICD-10-CM | POA: Diagnosis not present

## 2020-10-10 DIAGNOSIS — W07XXXA Fall from chair, initial encounter: Secondary | ICD-10-CM | POA: Insufficient documentation

## 2020-10-10 DIAGNOSIS — S12030A Displaced posterior arch fracture of first cervical vertebra, initial encounter for closed fracture: Secondary | ICD-10-CM | POA: Insufficient documentation

## 2020-10-10 DIAGNOSIS — S199XXA Unspecified injury of neck, initial encounter: Secondary | ICD-10-CM | POA: Diagnosis present

## 2020-10-10 DIAGNOSIS — S0083XA Contusion of other part of head, initial encounter: Secondary | ICD-10-CM | POA: Insufficient documentation

## 2020-10-10 DIAGNOSIS — F17210 Nicotine dependence, cigarettes, uncomplicated: Secondary | ICD-10-CM | POA: Insufficient documentation

## 2020-10-10 NOTE — ED Notes (Addendum)
Pt placed in [place] J cervical collar due to no [place] c-collar available. EDP [place] made aware.  Pt provided education on proper application of c-collar.

## 2020-10-10 NOTE — ED Provider Notes (Signed)
I personally evaluated the patient during the encounter and completed a history, physical, procedures, medical decision making to contribute to the overall care of the patient and decision making for the patient briefly, the patient is a 75 y.o. female for CT of her neck after possible injury found on CT head that was performed outpatient.  Had mechanical falls earlier this week.  Not on blood thinners.  Supposedly outpatient head CT showed concern for may be C1 injury.  She is neurovascular neuromuscularly intact.  Normal strength and sensation upper extremities.  No specific midline tenderness.  We will get CT of neck.  Please see PA note for further results, evaluation, disposition of the patient.  This chart was dictated using voice recognition software.  Despite best efforts to proofread,  errors can occur which can change the documentation meaning.    EKG Interpretation None            Virgina Norfolk, DO 10/10/20 1002

## 2020-10-10 NOTE — ED Notes (Signed)
Patient said her BP is high because she is upset and wants to leave. RN made aware.

## 2020-10-10 NOTE — ED Provider Notes (Signed)
[facility]-EMERGENCY DEPT Provider Note   CSN: [id number] Arrival date & time: 10/10/20  4098     History Chief Complaint  Patient presents with   [name] is a 75 y.o. female.  Patient presents to the emergency department for evaluation of abnormal CT scan imaging.  Patient states that during the mornings of 8/29 and 10/08/20 she had a fall.  She states that she woke up about 2 hours after taking gabapentin.  This is a new medication for her.  She states that she sat down on a stool and fell striking her head.  She developed some bruising on her face.  She saw her doctor yesterday who ordered a head CT.  This was performed and showed questionable signs of fracture at C1.  Patient was called and told to come to the emergency department.  She states that currently she does not have any pain in her neck.  No weakness, numbness, or tingling in her arms or her legs.  She is able to ambulate well.  She does not develop pain with movement of the neck.  She denies vision change or loss, vomiting, confusion.  She states that she no longer wants to take the gabapentin because of the way it makes her feel.  Course is constant.  Denies other medical complaints.       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   Flatulence, eructation and gas pain 07/05/2020   Gastro-esophageal reflux disease without esophagitis  07/05/2020   Iron deficiency anemia 07/05/2020   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/2020   Migraine 07/05/2020   Mixed hyperlipidemia 07/05/2020   Osteoporosis 07/05/2020   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   Anemia of chronic disease 07/05/2020   Allergic rhinitis 07/05/2020   Chronic obstructive pulmonary disease (HCC) 07/05/2020   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   Peptic ulcer disease 07/05/2020   Osteoarthritis of ankle and foot, 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   Low back pain 04/15/2017   Protein-calorie malnutrition, severe (HCC) 09/29/2014   Gastric outlet obstruction 09/25/2014   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   Upper GI bleed 10/21/2011   Anemia 10/21/2011   SINUSITIS, ACUTE 02/17/2007   TOBACCO ABUSE 11/19/2006   ASTHMATIC BRONCHITIS, ACUTE 11/19/2006   RHINITIS, CHRONIC 11/19/2006    Past Surgical History:  Procedure Laterality Date   ESOPHAGOGASTRODUODENOSCOPY  10/22/2011   Procedure: ESOPHAGOGASTRODUODENOSCOPY (EGD);  Surgeon: Willis Modena, MD;  Location: [facility] ENDOSCOPY;  Service: Endoscopy;  Laterality: Left;   ESOPHAGOGASTRODUODENOSCOPY N/A 07/24/2014   Procedure: ESOPHAGOGASTRODUODENOSCOPY (EGD);  Surgeon: Charolett Bumpers, MD;  Location: [facility];  Service: Endoscopy;  Laterality: N/A;   GASTRIC OUTLET OBSTRUCTION RELEASE     7 years ago   PARTIAL GASTRECTOMY N/A 09/25/2014   Procedure: subtotal gastrectomy resection gastric jejunal anastamosis feeding jejunostomy roux -en-y reconstruction;  Surgeon: Glenna Fellows, MD;  Location: [facility] ORS;  Service:  General;  Laterality: N/A;   THYROIDECTOMY  2012   TOTAL ABDOMINAL HYSTERECTOMY       OB History   No obstetric history on file.     Family History  Problem Relation Age of Onset   Cancer Father    Cirrhosis Mother    Breast cancer Neg Hx     Social History   Tobacco Use   Smoking status: Every Day    Packs/day: 1.00    Years: 40.00    Pack years: 40.00    Types: Cigarettes   Smokeless tobacco: Never    Tobacco comments:    1 ppd as of 07/09/2020  Vaping Use   Vaping Use: Never used  Substance Use Topics   Alcohol use: Yes    Comment: rarely   Drug use: No    Home Medications Prior to Admission medications   Medication Sig Start Date End Date Taking? Authorizing Provider  azithromycin (ZITHROMAX Z-PAK) 250 MG tablet Take 2 tablets on day 1 and then 1 tablet daily for 4 days 08/03/20   Tomma Lightning, MD  butalbital-acetaminophen-caffeine (FIORICET, ESGIC) 50-325-40 MG per tablet Take 1 tablet by mouth every 6 (six) hours as needed. Headaches. 08/25/14   [provider]  Butalbital-APAP-Caff-Cod 50-300-40-30 MG CAPS 1 every 4 hours only if needed for pain 06/09/18   Jetty Duhamel D, MD  esomeprazole (NEXIUM) 40 MG capsule Take 40 mg by mouth 2 (two) times daily.    [provider]  Fluticasone-Umeclidin-Vilant (TRELEGY ELLIPTA) 100-62.5-25 MCG/INH AEPB Inhale 1 puff into the lungs daily. 07/09/20   Jetty Duhamel D, MD  Fluticasone-Umeclidin-Vilant (TRELEGY ELLIPTA) 100-62.5-25 MCG/INH AEPB Inhale 1 puff into the lungs daily. 07/25/20   Waymon Budge, MD  HYDROcodone-acetaminophen (NORCO/VICODIN) 5-325 MG tablet Take 1-2 tablets by mouth every 4 (four) hours as needed for moderate pain.    [provider]  levothyroxine (SYNTHROID, LEVOTHROID) 125 MCG tablet Take 1 tablet by mouth daily. 03/18/15   [provider]  loratadine (CLARITIN) 10 MG tablet Take 10 mg by mouth daily.    [provider]  Multiple Vitamin (MULTIVITAMIN) capsule Take 1 capsule by mouth every morning.     [provider]  promethazine-codeine (PHENERGAN WITH CODEINE) 6.25-10 MG/5ML syrup Take 5 mLs by mouth every 6 (six) hours as needed for cough. Patient not taking: Reported on 07/09/2020 06/06/20   Waymon Budge, MD  traMADol (ULTRAM) 50 MG tablet Take 50 mg by mouth every 6 (six) hours as needed for moderate pain. Osteoarthritis pain Takes with Vicodin     [provider]    Allergies    Doxycycline, Levofloxacin, Nsaids, Penicillins, and Prednisone  Review of Systems   Review of Systems  Constitutional:  Negative for fatigue.  HENT:  Positive for facial swelling. Negative for congestion, rhinorrhea and tinnitus.   Eyes:  Negative for photophobia, pain and visual disturbance.  Respiratory:  Negative for shortness of breath.   Cardiovascular:  Negative for chest pain.  Gastrointestinal:  Negative for nausea and vomiting.  Musculoskeletal:  Negative for back pain, gait problem and neck pain.  Skin:  Negative for wound.  Neurological:  Negative for dizziness, weakness, light-headedness, numbness and headaches.  Psychiatric/Behavioral:  Negative for confusion and decreased concentration.    Physical Exam Updated Vital Signs BP (!) 166/95 (BP Location: Left Arm)   Pulse (!) 50   Temp 97.6 F (36.4 C) (Oral)   Resp 16   SpO2 99%   Physical Exam  Vitals and nursing note reviewed.  Constitutional:      Appearance: She is well-developed.  HENT:     Head: Normocephalic. No raccoon eyes or Battle's sign.     Comments: Mild ecchymosis of cheeks bilaterally. Full ROM jaw without point tenderness or pain.     Right Ear: Tympanic membrane, ear canal and external ear normal. No hemotympanum.     Left Ear: Tympanic membrane, ear canal and external ear normal. No hemotympanum.     Nose: Nose normal.     Mouth/Throat:     Pharynx: Uvula midline.  Eyes:     General: Lids are normal.     Extraocular Movements:     Right eye: No nystagmus.     Left eye: No nystagmus.     Conjunctiva/sclera: Conjunctivae normal.     Pupils: Pupils are equal, round, and reactive to light.     Comments: No visible hyphema noted  Cardiovascular:     Rate and Rhythm: Regular rhythm. Bradycardia present.  Pulmonary:     Effort: Pulmonary effort is normal.     Breath sounds: Normal breath sounds.  Abdominal:     Palpations: Abdomen is soft.      Tenderness: There is no abdominal tenderness.  Musculoskeletal:     Cervical back: Normal range of motion and neck supple. No tenderness or bony tenderness.     Thoracic back: No tenderness or bony tenderness.     Lumbar back: No tenderness or bony tenderness.     Comments: Normal upper extremity strength bilaterally.  No tenderness or step-offs of the cervical or upper thoracic spine.  Patient voluntarily moves her head in all directions without discomfort.  Skin:    General: Skin is warm and dry.  Neurological:     Mental Status: She is alert and oriented to person, place, and time.     GCS: GCS eye subscore is 4. GCS verbal subscore is 5. GCS motor subscore is 6.     Cranial Nerves: No cranial nerve deficit.     Sensory: No sensory deficit.     Coordination: Coordination normal.     Deep Tendon Reflexes: Reflexes are normal and symmetric.    ED Results / Procedures / Treatments   Labs (all labs ordered are listed, but only abnormal results are displayed) Labs Reviewed - No data to display  EKG None  Radiology CT HEAD WO CONTRAST (5MM)  Addendum Date: 10/09/2020   ADDENDUM REPORT: 10/09/2020 19:32 ADDENDUM: Impression #1 called by telephone at the time of interpretation on 10/09/2020 at 7:32 pm to provider Dr. Kevan NyGates, who verbally acknowledged these results. Electronically Signed   By: Jackey LogeKyle  Golden D.O.   On: 10/09/2020 19:32   Result Date: 10/09/2020 CLINICAL DATA:  Injury of head, initial encounter S09.90XA (ICD-10-CM). Additional history provided: Fall 4 days ago hitting back of head. Fall again the next evening hitting right side of head, bruising to right temple area. Dizziness. EXAM: CT HEAD WITHOUT CONTRAST TECHNIQUE: Contiguous axial images were obtained from the base of the skull through the vertex without intravenous contrast. COMPARISON:  No pertinent prior exams available for comparison. FINDINGS: Brain: Mild generalized cerebral atrophy. Mild patchy and ill-defined  hypoattenuation within the white matter, nonspecific but compatible with chronic small vessel ischemic disease. There is no acute intracranial hemorrhage. No demarcated cortical infarct. No extra-axial fluid collection. No evidence of an intracranial mass. No midline shift. Vascular: No hyperdense vessel. Atherosclerotic calcifications. Skull: Normal. Negative for fracture or focal lesion.  Sinuses/Orbits: Visualized orbits show no acute finding. Small-volume frothy secretions within the right sphenoid sinus. Other: Incompletely imaged displaced fracture versus developmental defect within the anterior arch of C1 (for instance as seen on series 5, image 4). Possible incompletely imaged fracture within the left C1 posterior arch (series 5, image 2). Probable developmental defect within the right posterior arch of C1. There is asymmetric widening of the right lateral atlantodental interval to 7 mm (series 5, image 3). IMPRESSION: Incompletely imaged displaced fracture versus developmental defect within the anterior arch of C1. Possible incompletely imaged fracture within the left C1 posterior arch. Asymmetric widening of the right lateral atlantodental interval to 7 mm. A dedicated CT of the cervical spine is recommended for further evaluation. No evidence of acute intracranial abnormality. Mild chronic small-vessel ischemic changes within the cerebral white matter. Mild generalized cerebral atrophy. Right sphenoid sinusitis. Electronically Signed: By: Jackey Loge D.O. On: 10/09/2020 19:14   CT Cervical Spine Wo Contrast  Result Date: 10/10/2020 CLINICAL DATA:  Neck trauma (Age >= 65y); recent abnormal head CT at C1. Multiple recent falls. EXAM: CT CERVICAL SPINE WITHOUT CONTRAST TECHNIQUE: Multidetector CT imaging of the cervical spine was performed without intravenous contrast. Multiplanar CT image reconstructions were also generated. COMPARISON:  Head CT 10/09/2020. FINDINGS: Alignment: Trace C4-C5 grade 1  anterolisthesis. 8 mm C5-C6 anterolisthesis. Skull base and vertebrae: Again demonstrated is a defect within the anterior arch of C1 with widening to 4 mm. Redemonstrated curvilinear lucency traversing the left C1 posterior arch (series 7, image 24). Thinned appearance of the remainder of the C1 posterior arch with suspected developmental defect within the right C1 posterior arch. Widening of the right lateral atlantodental interval to 7 mm. Pannus formation at the C1-C2 articulation with erosive changes within the underlying dens. Prominent degenerative endplate irregularity at C6-C7 where there is complete disc space narrowing. Soft tissues and spinal canal: No prevertebral fluid or swelling. No visible canal hematoma. Disc levels: Cervical spondylosis with multilevel disc space narrowing, disc bulges, endplate spurring, uncovertebral hypertrophy and facet arthrosis. Disc space narrowing is severe at C6-C7. Facet arthrosis is severe at C5-C6 where there is 8 mm anterolisthesis. No appreciable high-grade spinal canal stenosis. Multilevel bony neural foraminal narrowing. Upper chest: No consolidation within the imaged lung apices. No visible pneumothorax. Biapical pleuroparenchymal scarring. Centrilobular emphysema. IMPRESSION: Redemonstrated defect within the anterior arch of C1 with widening to 4 mm. Redemonstrated nondisplaced defect within the left C1 posterior arch. These defects may reflect acute fractures. A cervical spine MRI is recommended to assess for marrow edema at these sites. Associated widening of the right lateral atlantodental interval to 7 mm. MRI would also be helpful in assessing for ligamentous injury. Thinned appearance of the remainder of the C1 posterior arch with suspected developmental defect within the right C1 posterior arch. 8 mm C5-C6 grade 1 anterolisthesis, which may be degenerative and related to advanced facet arthrosis at this level. However, an MRI would be helpful in excluding  acute ligamentous injury at this site. Trace C4-C5 grade 1 anterolisthesis. Cervical spondylosis, as outlined. No appreciable high-grade spinal canal stenosis. Multilevel bony neural foraminal narrowing. Pannus formation at the C1-C2 articulation with erosive changes in the underlying dens. Emphysema (ICD10-J43.9). Electronically Signed   By: Jackey Loge D.O.   On: 10/10/2020 11:38    Procedures Procedures   Medications Ordered in ED Medications - No data to display  ED Course  I have reviewed the triage vital signs and the nursing notes.  Pertinent labs &  imaging results that were available during my care of the patient were reviewed by me and considered in my medical decision making (see chart for details).  Patient seen and examined. CT ordered.   Vital signs reviewed and are as follows: BP (!) 166/95 (BP Location: Left Arm)   Pulse (!) 50   Temp 97.6 F (36.4 C) (Oral)   Resp 16   SpO2 99%   Patient discussed with and seen by Dr. Lockie Mola.   CT findings as above.  Discussed with Dr. Conchita Paris.  Discussed that as patient is neurologically intact without significant pain, can defer MRI at this time.  This can be obtained as an outpatient if needed.  Patient has been placed in a [place] J collar.  Discussed that she needs to wear this at all times due to neck fractures and that she risks worsening of fracture or causing neurologic injury if she is moving her neck.  She verbalizes understanding.  She is ready to go.  Patient provided follow-up information for Dr. Val Riles office and encouraged to follow-up.  She will continue home medications for pain.  To return with development of weakness, severe pain, new symptoms or other concerns.    MDM Rules/Calculators/A&P                           Patient with cervical spine fractures as above, likely related to recent fall.  No neurologic deficits.  She is minimally symptomatic.  Head CT performed yesterday without any concerning  intracranial bleeding or other injury.  Plan for outpatient follow-up as above.   Final Clinical Impression(s) / ED Diagnoses Final diagnoses:  Closed displaced fracture of posterior arch of first cervical vertebra, initial encounter [facility])    Rx / DC Orders ED Discharge Orders     None        Renne Crigler, PA-C 10/10/20 1300    Virgina Norfolk, DO 10/10/20 1302

## 2020-10-10 NOTE — Discharge Instructions (Signed)
Please read and follow all provided instructions.  Your diagnoses today include:  1. Closed displaced fracture of posterior arch of first cervical vertebra, initial encounter (HCC)     Tests performed today include: CT cervical spine - shows fractures of the C1 vertebra Vital signs. See below for your results today.   Medications prescribed:  None  Take any prescribed medications only as directed.  Home care instructions:  Follow any educational materials contained in this packet.  BE VERY CAREFUL not to take multiple medicines containing Tylenol (also called acetaminophen). Doing so can lead to an overdose which can damage your liver and cause liver failure and possibly death.   Follow-up instructions:  Call Dr. Val Riles office today or tomorrow and schedule an appointment for 1 to 2 weeks.  Return instructions:  Please return to the Emergency Department if you experience worsening symptoms.  Please return if you have any other emergent concerns.  Additional Information:  Your vital signs today were: BP (!) 179/134   Pulse 81   Temp 98.6 F (37 C) (Oral)   Resp 16   SpO2 99%  If your blood pressure (BP) was elevated above 135/85 this visit, please have this repeated by your doctor within one month. --------------

## 2020-10-10 NOTE — ED Triage Notes (Signed)
PT reports sent for further evaluation of possible C1 fx after imaging yesterday. Two falls this week after starting new medication.

## 2020-10-10 NOTE — ED Notes (Signed)
D/c paperwork reviewed with pt, no questions or concerns at time of d/c. Pt ambulatory to ED exit.

## 2020-11-13 ENCOUNTER — Encounter (HOSPITAL_COMMUNITY): Payer: Self-pay | Admitting: Radiology

## 2021-03-05 ENCOUNTER — Telehealth: Payer: Self-pay | Admitting: Internal Medicine

## 2021-03-05 NOTE — Telephone Encounter (Addendum)
Bevelyn Ngo, NP  You 15 minutes ago (11:02 AM)   She needs a video visit or televisit.      Attempted to call pt but unable to reach. Left message for her to return call. Routing back to triage as this was sent directly back to me.

## 2021-03-05 NOTE — Telephone Encounter (Signed)
Primary Pulmonologist: Young Last office visit and with whom: 06/30/20 with CY What do we see them for (pulmonary problems): emphysema Last OV assessment/plan:  Important   (BP Location: Left Arm, Patient Position: Sitting, Cuff Size: Normal) Pulse 87 Temp 98.7 F (37.1 C) (Temporal) Ht 5\' 2"  (1.575 m) Wt 84 lb (38.1 kg) SpO2 98% BMI 15.36 kg/m BSA 1.29 m  More Vitals   Flowsheets:  MMRC DYSPNEA SCALE, Extended Vitals, NEWS, MEWS Score, Anthropometrics, Method of Visit   Encounter Info:  Billing Info, History, Allergies, Detailed Report    All Notes   Progress Notes by Waymon BudgeYoung, Clinton D, MD at 07/09/2020 11:00 AM  Author: Waymon BudgeYoung, Clinton D, MD Author Type: Physician Filed: 07/09/2020  3:34 PM  Note Status: Signed Cosign: Cosign Not Required Encounter Date: 07/09/2020  Editor: Waymon BudgeYoung, Clinton D, MD (Physician)                    Patient ID: Sheila BeersSandra Beard, female    DOB: 04/27/1945, 76 y.o.   MRN: [id number]   HPI  female smoker followed for allergic rhinitis, asthmatic bronchitis/ COPD mixed type, complicated by history he GI bleed/anemia PFT 10/14/10-reviewed with her, showing mild/ moderate obstructive airways disease with insignificant response to bronchodilator, hyperinflation and air trapping, normal diffusion. FEV1/FVC 0.65. Office Spirometry- 11/30/13- Mild airway obstruction. FVC 2.81/ 116%, FEV1 1.49/ 80%, FEV1/FVC 0.53. -----------------------------------------------------------------------------------   12/07/2018-  Virtual Visit via Telephone Note History of Present Illness: 76 yo female smoker followed for allergic rhinitis, asthmatic bronchitis/ COPD mixed type complicated by history he GI bleed/anemia -----pt reports a low grad fever, chest congestion that is causing pain in chest/side and productive cough with green mucus Smoking 1/2 ppd, flu vax pending. Acute productive cough last few days, interfering w sleep. Asks help with cough until eligible to refill  tramadol which is usually used for her arthritis. Sputum clear, no blood or chest pain.  No inhalers- trials had been ineffective in past.   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 Can also try ibuprofen. Med talk.  At next ov look again at role for inhaled meds. Tobacco abuse- Reinforce need to stop smoking. She has resisted. Follow Up Instructions: 6 months     07/09/20- 76 yo female Smoker followed for Allergic Rhinitis, asthmatic bronchitis/ COPD mixed type complicated by history of UGI bleed/anemia, GERD, Hypothyroid, Osteoarthritis, Tobacco abuse, Protein-Calorie malnutrition, Chronic Pain, Anemia Chronic Disease,  -No listed inhalers Body weight today-84 lbs Last CXR 2018 Covid vax- Phizer -----Allergies with runny nose. We sent Zpak and cough med in April. She indicated that resolved and she is at baseline now. Activity mainly limited by diffuse arthritis.  Scant clear phlegm about once daily. Not much cough or wheeze.  Edentulous after extractions last year. Dislikes her dentures.    Review of Systems- see HPI + = positive Constitutional:   No-   weight loss, night sweats, fevers, chills, fatigue, lassitude. HEENT:     headaches, no-difficulty swallowing, tooth/dental problems, sore       throat,  No-  sneezing, itching, ear ache,  +nasal congestion, +post nasal drip,  CV:  No-   chest pain, orthopnea, PND, swelling in lower extremities, anasarca, dizziness, palpitations Resp:+ little acute  shortness of breath with exertion or at rest.              + productive cough,  No non-productive  cough,  No-  coughing up of blood.              No-change in color of mucus.  No- wheezing.   Skin: No-   rash or lesions. GI:  No-   heartburn, indigestion, abdominal pain, nausea, vomiting, GU:  MS:  + joint pain or swelling.  + back pain Neuro- nothing  unusual Psych:  No- change in mood or affect. No depression or anxiety.  No memory loss.   Objective:   Physical Exam General- Alert, Oriented, Affect-appropriate, Distress- none acute, petite + Skin- rash-none, lesions- none, excoriation- none. Normally pale complexion,+ thin Lymphadenopathy- none Head- atraumatic            Eyes- Gross vision intact, PERRLA, conjunctivae clear secretions                Ears- Hearing, canals normal            Nose- , no-Septal dev, mucus, polyps, erosion, perforation             Throat- Mallampati II , mucosa clear , drainage- none, tonsils- atrophic Neck- flexible , trachea midline, no stridor , thyroid nl, carotid no bruit Chest - symmetrical excursion , unlabored           Heart/CV- RRR , no murmur , no gallop  , no rub, nl s1 s2                           - JVD- none , edema- none, stasis changes- none, varices- none           Lung- +decreased/ clear, wheeze- none, cough- none , dullness-none, rub- none               Chest wall-+ scoliosis. Abd- Br/ Gen/ Rectal- Not done, not indicated Extrem- +Arthritic deformities of fingers Neuro- grossly intact to observation                               Assessment & Plan Note by Waymon Budge, MD at 07/09/2020 3:33 PM  Author: Waymon Budge, MD Author Type: Physician Filed: 07/09/2020  3:33 PM  Note Status: Written Cosign: Cosign Not Required Encounter Date: 07/09/2020  Problem: TOBACCO ABUSE  Editor: Waymon Budge, MD (Physician)               I again brought up smoking and importance of quitting. Support available.        Assessment & Plan Note by Waymon Budge, MD at 07/09/2020 3:31 PM  Author: Waymon Budge, MD Author Type: Physician Filed: 07/09/2020  3:32 PM  Note Status: Written Cosign: Cosign Not Required Encounter Date: 07/09/2020  Problem: COPD with emphysema [facility])  Editor: Waymon Budge, MD (Physician)               Mostly emphysema. Physical activty is very  limited so she isn't much impacted by dyspnea. Plan- CXR, sample trial Trelegy 100 with discussion        Patient Instructions by Waymon Budge, MD at 07/09/2020 11:00 AM  Author: Waymon Budge, MD Author Type: Physician Filed: 07/09/2020 11:23 AM  Note Status: Signed Cosign: Cosign Not Required Encounter Date: 07/09/2020  Editor: Waymon Budge, MD (Physician)               Order- CXR    Dx  Tobacco user  Order- sample x 2 Trelegy 100    Inhale 1 puff then rinse mouth, once daily  See if this helps with shortness of breath and cough   We continue to hope you will stop smoking   Please call if we can help       Orthostatic Vitals Recorded in This Encounter   07/09/2020  1103     Patient Position: Sitting  BP Location: Left Arm  Cuff Size: Normal   Instructions    Return in about 1 year (around 07/09/2021).    Was appointment offered to patient (explain)?  Appt was offered more than once but pt refused   Reason for call:  Called and spoke with pt who states that she has not been feeling well since November 2022. States that she will have problems with coughing that comes and goes. Pt states that originally phlegm was clear but states that she is now coughing up green.  Pt also states that she has had a low grade temp of 100.0. states that her ears and sinuses are stopped up.  Pt stated that she took a covid test yesterday 1/24 which came back negative. Pt stated that she is using her Trelegy inhaler as prescribed.  I stated to pt that due to her symptoms and especially how long she has not been feeling good that we needed to have her come in for an appt to be further evaluated. Pt stated that she did not want to come in for an appt and was just wanting to have something called in to help with her symptoms. I stated to pt again that we really needed her to come in for an appt for further evaluation due to how long it has been since her last appt and also since she  has not been feeling well for 3 months now and pt again stated that she would not schedule an appt. Advised pt if she did not want to come in for an appt at our office that she could go to UC to be evaluated and pt stated that she refused to go to UC to be evaluated and wanted to just have meds called in for her.  Sarah, please advise if you are okay prescribing something for pt to see if it would help with her symptoms.  Allergies  Allergen Reactions   Doxycycline Itching   Levofloxacin     REACTION: GI upset, mouth/throat sores   Nsaids     Other reaction(s): gastric bleeding   Penicillins     REACTION: itch   Prednisone     Other reaction(s): stomach upset    Immunization History  Administered Date(s) Administered   Influenza Split 12/16/2010, 12/11/2011, 12/22/2011, 12/27/2012, 12/19/2014, 12/11/2015, 12/24/2016, 11/22/2019   Influenza Whole 11/05/2008, 12/20/2009, 12/11/2010   Influenza, High Dose Seasonal PF 12/21/2013, 11/28/2014, 12/03/2015, 01/08/2017, 01/11/2018, 02/13/2019   Influenza-Unspecified 12/10/2012, 01/08/2018   PFIZER(Purple Top)SARS-COV-2 Vaccination 03/12/2019, 04/06/2019, 12/29/2019   Pneumococcal Conjugate-13 03/01/2013   Pneumococcal Polysaccharide-23 07/19/2007, 12/03/2015   Td 07/19/2002, 08/31/2012

## 2021-03-06 ENCOUNTER — Telehealth: Payer: Self-pay | Admitting: *Deleted

## 2021-03-06 NOTE — Telephone Encounter (Signed)
Spoke with KC regarding televisit, she felt patient needs an in office visit d/t her symptoms (needs to be swabbed for flu, cxr and possible labs).

## 2021-03-06 NOTE — Telephone Encounter (Signed)
Spoke with Paxton regarding televisit, she felt patient needs an in office visit d/t her symptoms (needs to be swabbed for flu, cxr and possible labs).     Me   HF   10:00 AM Note Called and spoke with patient, she states she does not have a smart phone or a computer and unable to do a video visit.  She has been running a fever between 100-101 for the past few days.  Her temperature yesterday was 101 and today it is 100.  She is still smoking (when she can).  She did not want to come into the office because of germs and did not feel like driving.  She stated all her friends are dead and her children live in [place].  She states she thinks she may have the flu.  I offered her to come and sit in the parking lot and be swabbed for the flu in her car and if she was positive we would treat her accordingly, if it was negative, we would have her come in the office and be evaluated by one of our NPs and get a cxr/labs if necessary.  She declined to do either.  She declined to go to an urgent care as well because there are even more germs there.  She said she would call us back if she changed her mind about coming back.  Will see if she calls back.

## 2021-03-06 NOTE — Telephone Encounter (Signed)
Called and spoke with patient, she states she does not have a smart phone or a computer and unable to do a video visit.  She has been running a fever between 100-101 for the past few days.  Her temperature yesterday was 101 and today it is 100.  She is still smoking (when she can).  She did not want to come into the office because of germs and did not feel like driving.  She stated all her friends are dead and her children live in [place].  She states she thinks she may have the flu.  I offered her to come and sit in the parking lot and be swabbed for the flu in her car and if she was positive we would treat her accordingly, if it was negative, we would have her come in the office and be evaluated by one of our NPs and get a cxr/labs if necessary.  She declined to do either.  She declined to go to an urgent care as well because there are even more germs there.  She said she would call us back if she changed her mind about coming back.  Will see if she calls back.

## 2021-04-01 ENCOUNTER — Other Ambulatory Visit: Payer: Medicare Other

## 2021-07-09 ENCOUNTER — Ambulatory Visit: Payer: Medicare Other | Admitting: Internal Medicine

## 2021-07-13 NOTE — Progress Notes (Deleted)
Patient ID: [name], female    DOB: May 22, 1945, 76 y.o.   MRN: [id number]  HPI  female smoker followed for allergic rhinitis, asthmatic bronchitis/ COPD mixed type, complicated by history he GI bleed/anemia PFT 10/14/10-reviewed with her, showing mild/ moderate obstructive airways disease with insignificant response to bronchodilator, hyperinflation and air trapping, normal diffusion. FEV1/FVC 0.65. Office Spirometry- 11/30/13- Mild airway obstruction. FVC 2.81/ 116%, FEV1 1.49/ 80%, FEV1/FVC 0.53. -----------------------------------------------------------------------------------     07/09/20- 76 yo female Smoker followed for Allergic Rhinitis, asthmatic bronchitis/ COPD mixed type complicated by history of UGI bleed/anemia, GERD, Hypothyroid, Osteoarthritis, Tobacco abuse, Protein-Calorie malnutrition, Chronic Pain, Anemia Chronic Disease,  -No listed inhalers Body weight today-84 lbs Last CXR 2018 Covid vax- Phizer -----Allergies with runny nose. We sent Zpak and cough med in April. She indicated that resolved and she is at baseline now. Activity mainly limited by diffuse arthritis.  Scant clear phlegm about once daily. Not much cough or wheeze.  Edentulous after extractions last year. Dislikes her dentures.   07/15/21- 76 yo female Smoker followed for Allergic Rhinitis, asthmatic bronchitis/ COPD mixed type complicated by history of UGI bleed/anemia, GERD, Hypothyroid, Osteoarthritis, Tobacco abuse, Protein-Calorie malnutrition, Chronic Pain, Anemia Chronic Disease,  - Trelegy 100,  Body weight today- Covid vax- Phizer  CXR 07/09/20- IMPRESSION: No active cardiopulmonary disease. Emphysematous disease with bronchitic changes   Review of Systems- see HPI + = positive Constitutional:   No-   weight loss, night sweats, fevers, chills, fatigue, lassitude. HEENT:     headaches, no-difficulty swallowing, tooth/dental problems, sore       throat,  No-  sneezing, itching, ear ache,   +nasal congestion, +post nasal drip,  CV:  No-   chest pain, orthopnea, PND, swelling in lower extremities, anasarca, dizziness, palpitations Resp:+ little acute  shortness of breath with exertion or at rest.              + productive cough,  No non-productive cough,  No-  coughing up of blood.              No-change in color of mucus.  No- wheezing.   Skin: No-   rash or lesions. GI:  No-   heartburn, indigestion, abdominal pain, nausea, vomiting, GU:  MS:  + joint pain or swelling.  + back pain Neuro- nothing unusual Psych:  No- change in mood or affect. No depression or anxiety.  No memory loss.  Objective:   Physical Exam General- Alert, Oriented, Affect-appropriate, Distress- none acute, petite + Skin- rash-none, lesions- none, excoriation- none. Normally pale complexion,+ thin Lymphadenopathy- none Head- atraumatic            Eyes- Gross vision intact, PERRLA, conjunctivae clear secretions                Ears- Hearing, canals normal            Nose- , no-Septal dev, mucus, polyps, erosion, perforation             Throat- Mallampati II , mucosa clear , drainage- none, tonsils- atrophic Neck- flexible , trachea midline, no stridor , thyroid nl, carotid no bruit Chest - symmetrical excursion , unlabored           Heart/CV- RRR , no murmur , no gallop  , no rub, nl s1 s2                           - JVD- none ,  edema- none, stasis changes- none, varices- none           Lung- +decreased/ clear, wheeze- none, cough- none , dullness-none, rub- none               Chest wall-+ scoliosis. Abd- Br/ Gen/ Rectal- Not done, not indicated Extrem- +Arthritic deformities of fingers Neuro- grossly intact to observation

## 2021-07-15 ENCOUNTER — Ambulatory Visit: Payer: Medicare Other | Admitting: Internal Medicine

## 2021-08-14 ENCOUNTER — Ambulatory Visit: Payer: Medicare Other | Admitting: Internal Medicine

## 2021-08-27 ENCOUNTER — Ambulatory Visit
Admission: RE | Admit: 2021-08-27 | Discharge: 2021-08-27 | Disposition: A | Discharge status: Home / Self Care | Payer: Medicare Other | Source: Ambulatory Visit | Attending: Internal Medicine | Admitting: Internal Medicine

## 2021-08-27 DIAGNOSIS — M810 Age-related osteoporosis without current pathological fracture: Secondary | ICD-10-CM

## 2021-09-10 NOTE — Progress Notes (Deleted)
Patient ID: [name], female    DOB: [birth year], 76 y.o.   MRN: [id number]  HPI  female smoker followed for allergic rhinitis, asthmatic bronchitis/ COPD mixed type, complicated by history he GI bleed/anemia PFT 10/14/10-reviewed with her, showing mild/ moderate obstructive airways disease with insignificant response to bronchodilator, hyperinflation and air trapping, normal diffusion. FEV1/FVC 0.65. Office Spirometry- 11/30/13- Mild airway obstruction. FVC 2.81/ 116%, FEV1 1.49/ 80%, FEV1/FVC 0.53. -----------------------------------------------------------------------------------  07/09/20- 76 yo female Smoker followed for Allergic Rhinitis, asthmatic bronchitis/ COPD mixed type complicated by history of UGI bleed/anemia, GERD, Hypothyroid, Osteoarthritis, Tobacco abuse, Protein-Calorie malnutrition, Chronic Pain, Anemia Chronic Disease,  -No listed inhalers Body weight today-84 lbs Last CXR 2018 Covid vax- Phizer -----Allergies with runny nose. We sent Zpak and cough med in April. She indicated that resolved and she is at baseline now. Activity mainly limited by diffuse arthritis.  Scant clear phlegm about once daily. Not much cough or wheeze.  Edentulous after extractions last year. Dislikes her dentures.   09/11/21- 76 yo female Smoker followed for Allergic Rhinitis, asthmatic bronchitis/ COPD mixed type complicated by history of UGI bleed/anemia, GERD, Hypothyroid, Osteoarthritis, Tobacco abuse, Protein-Calorie malnutrition, Chronic Pain, Anemia Chronic Disease,  -Trelegy 100 (sample only??),  Body weight today- Covid vax- Phizer  CXR 07/09/20-  IMPRESSION: No active cardiopulmonary disease. Emphysematous disease with bronchitic changes    Review of Systems- see HPI + = positive Constitutional:   No-   weight loss, night sweats, fevers, chills, fatigue, lassitude. HEENT:     headaches, no-difficulty swallowing, tooth/dental problems, sore       throat,  No-  sneezing, itching,  ear ache,  +nasal congestion, +post nasal drip,  CV:  No-   chest pain, orthopnea, PND, swelling in lower extremities, anasarca, dizziness, palpitations Resp:+ little acute  shortness of breath with exertion or at rest.              + productive cough,  No non-productive cough,  No-  coughing up of blood.              No-change in color of mucus.  No- wheezing.   Skin: No-   rash or lesions. GI:  No-   heartburn, indigestion, abdominal pain, nausea, vomiting, GU:  MS:  + joint pain or swelling.  + back pain Neuro- nothing unusual Psych:  No- change in mood or affect. No depression or anxiety.  No memory loss.  Objective:   Physical Exam General- Alert, Oriented, Affect-appropriate, Distress- none acute, petite + Skin- rash-none, lesions- none, excoriation- none. Normally pale complexion,+ thin Lymphadenopathy- none Head- atraumatic            Eyes- Gross vision intact, PERRLA, conjunctivae clear secretions                Ears- Hearing, canals normal            Nose- , no-Septal dev, mucus, polyps, erosion, perforation             Throat- Mallampati II , mucosa clear , drainage- none, tonsils- atrophic Neck- flexible , trachea midline, no stridor , thyroid nl, carotid no bruit Chest - symmetrical excursion , unlabored           Heart/CV- RRR , no murmur , no gallop  , no rub, nl s1 s2                           - JVD- none ,  edema- none, stasis changes- none, varices- none           Lung- +decreased/ clear, wheeze- none, cough- none , dullness-none, rub- none               Chest wall-+ scoliosis. Abd- Br/ Gen/ Rectal- Not done, not indicated Extrem- +Arthritic deformities of fingers Neuro- grossly intact to observation

## 2021-09-11 ENCOUNTER — Ambulatory Visit: Payer: Medicare Other | Admitting: Internal Medicine

## 2021-09-14 NOTE — Progress Notes (Signed)
Patient ID: [name], female    DOB: [birth year], 76 y.o.   MRN: [id number]  HPI  female smoker followed for allergic rhinitis, asthmatic bronchitis/ COPD mixed type, complicated by history he GI bleed/anemia PFT 10/14/10-reviewed with her, showing mild/ moderate obstructive airways disease with insignificant response to bronchodilator, hyperinflation and air trapping, normal diffusion. FEV1/FVC 0.65. Office Spirometry- 11/30/13- Mild airway obstruction. FVC 2.81/ 116%, FEV1 1.49/ 80%, FEV1/FVC 0.53. -----------------------------------------------------------------------------------   07/09/20- 76 yo female Smoker followed for Allergic Rhinitis, asthmatic bronchitis/ COPD mixed type complicated by history of UGI bleed/anemia, GERD, Hypothyroid, Osteoarthritis, Tobacco abuse, Protein-Calorie malnutrition, Chronic Pain, Anemia Chronic Disease,  -No listed inhalers Body weight today-84 lbs Last CXR 2018 Covid vax- Phizer -----Allergies with runny nose. We sent Zpak and cough med in April. She indicated that resolved and she is at baseline now. Activity mainly limited by diffuse arthritis.  Scant clear phlegm about once daily. Not much cough or wheeze.  Edentulous after extractions last year. Dislikes her dentures. 8  09/16/21- 76 yo female Smoker followed for Allergic Rhinitis, asthmatic bronchitis/ COPD mixed type complicated by history of UGI bleed/anemia, GERD, Hypothyroid, Osteoarthritis, Tobacco abuse, Protein-Calorie malnutrition, Chronic Pain, Anemia Chronic Disease, Hypothyroid, Cervical DDD,  Body weight today- 80lbs Pt f/u breathing has been okay. Has had a productive cough w/ clear mucus. No SOB to report Never Covid infection Still smoking 1/2 pack/day with no intention to quit.  Considers breathing stable with little cough.  No acute changes.  Trelegy seemed to help her breathing but after 2 days of use she developed some pain in the left eye which she related to her history of  thyroid disease x 30 years.  She thought there might be a connection.  She agreed to trying a comparable alternative-Breztri sample. CXR 07/09/20-  IMPRESSION: No active cardiopulmonary disease. Emphysematous disease with bronchitic changes   Review of Systems- see HPI + = positive Constitutional:   No-   weight loss, night sweats, fevers, chills, fatigue, lassitude. HEENT:     headaches, no-difficulty swallowing, tooth/dental problems, sore       throat,  No-  sneezing, itching, ear ache,  +nasal congestion, +post nasal drip,  CV:  No-   chest pain, orthopnea, PND, swelling in lower extremities, anasarca, dizziness, palpitations Resp:+ little acute  shortness of breath with exertion or at rest.              + productive cough,  No non-productive cough,  No-  coughing up of blood.              No-change in color of mucus.  No- wheezing.   Skin: No-   rash or lesions. GI:  No-   heartburn, indigestion, abdominal pain, nausea, vomiting, GU:  MS:  + joint pain or swelling.  + back pain Neuro- nothing unusual Psych:  No- change in mood or affect. No depression or anxiety.  No memory loss.  Objective:   Physical Exam General- Alert, Oriented, Affect-appropriate, Distress- none acute, thin/petite + Skin- rash-none, lesions- none, excoriation- none. Normally pale complexion,+ thin Lymphadenopathy- none Head- atraumatic            Eyes- Gross vision intact, PERRLA, conjunctivae clear secretions                Ears- Hearing, canals normal            Nose-  no-Septal dev, mucus, polyps, erosion, perforation  Throat- Mallampati II , mucosa clear , drainage- none, tonsils- atrophic Neck- flexible , trachea midline, no stridor , thyroid nl, carotid no bruit Chest - symmetrical excursion , unlabored           Heart/CV- RRR , no murmur , no gallop  , no rub, nl s1 s2                           - JVD- none , edema- none, stasis changes- none, varices- none           Lung- +decreased/  clear, wheeze- none, cough- none , dullness-none, rub- none               Chest wall-+ scoliosis. Abd- Br/ Gen/ Rectal- Not done, not indicated Extrem- +Arthritic deformities of fingers Neuro- grossly intact to observation

## 2021-09-16 ENCOUNTER — Encounter: Payer: Self-pay | Admitting: Internal Medicine

## 2021-09-16 ENCOUNTER — Ambulatory Visit (INDEPENDENT_AMBULATORY_CARE_PROVIDER_SITE_OTHER): Payer: Medicare Other

## 2021-09-16 ENCOUNTER — Ambulatory Visit (INDEPENDENT_AMBULATORY_CARE_PROVIDER_SITE_OTHER): Payer: Medicare Other | Admitting: Internal Medicine

## 2021-09-16 VITALS — BP 130/80 | HR 85 | Ht 62.0 in | Wt 80.8 lb

## 2021-09-16 DIAGNOSIS — J449 Chronic obstructive pulmonary disease, unspecified: Secondary | ICD-10-CM

## 2021-09-16 DIAGNOSIS — E43 Unspecified severe protein-calorie malnutrition: Secondary | ICD-10-CM

## 2021-09-16 DIAGNOSIS — J439 Emphysema, unspecified: Secondary | ICD-10-CM | POA: Diagnosis not present

## 2021-09-16 DIAGNOSIS — F17200 Nicotine dependence, unspecified, uncomplicated: Secondary | ICD-10-CM | POA: Diagnosis not present

## 2021-09-16 MED ORDER — BREZTRI AEROSPHERE 160-9-4.8 MCG/ACT IN AERO: 2.0000 | INHALATION_SPRAY | Freq: Two times a day (BID) | RESPIRATORY_TRACT | 6 refills | Status: AC

## 2021-09-16 NOTE — Assessment & Plan Note (Signed)
She has resisted offers of support and counseling to quit.  We will continue efforts.

## 2021-09-16 NOTE — Patient Instructions (Addendum)
Order- sample Breztri inhaler    inhale 2 puffs then rinse mouth, twice daily  Order- CXR   dx COPD mixed type  Please call if we can help

## 2021-09-16 NOTE — Assessment & Plan Note (Signed)
She remains very small.

## 2021-09-16 NOTE — Assessment & Plan Note (Addendum)
COPD mixed type, emphysema predominant.  Bronchitis component almost inactive currently.  I do not know about her eye pain in association with Trelegy. Plan-try sample of Breztri, CXR

## 2021-10-02 ENCOUNTER — Other Ambulatory Visit: Payer: Self-pay

## 2021-10-02 ENCOUNTER — Inpatient Hospital Stay (HOSPITAL_COMMUNITY)
Admission: EM | Admit: 2021-10-02 | Discharge: 2021-10-10 | DRG: 064 | Disposition: E | Payer: Medicare Other | Attending: Neurology | Admitting: Neurology

## 2021-10-02 ENCOUNTER — Emergency Department (HOSPITAL_COMMUNITY): Payer: Medicare Other

## 2021-10-02 DIAGNOSIS — Z79891 Long term (current) use of opiate analgesic: Secondary | ICD-10-CM

## 2021-10-02 DIAGNOSIS — Z515 Encounter for palliative care: Secondary | ICD-10-CM

## 2021-10-02 DIAGNOSIS — Z79899 Other long term (current) drug therapy: Secondary | ICD-10-CM

## 2021-10-02 DIAGNOSIS — K219 Gastro-esophageal reflux disease without esophagitis: Secondary | ICD-10-CM | POA: Diagnosis present

## 2021-10-02 DIAGNOSIS — R42 Dizziness and giddiness: Secondary | ICD-10-CM | POA: Diagnosis not present

## 2021-10-02 DIAGNOSIS — J439 Emphysema, unspecified: Secondary | ICD-10-CM | POA: Diagnosis present

## 2021-10-02 DIAGNOSIS — R64 Cachexia: Secondary | ICD-10-CM | POA: Diagnosis present

## 2021-10-02 DIAGNOSIS — G936 Cerebral edema: Secondary | ICD-10-CM | POA: Diagnosis present

## 2021-10-02 DIAGNOSIS — I639 Cerebral infarction, unspecified: Secondary | ICD-10-CM

## 2021-10-02 DIAGNOSIS — Z903 Acquired absence of stomach [part of]: Secondary | ICD-10-CM

## 2021-10-02 DIAGNOSIS — Z79890 Hormone replacement therapy (postmenopausal): Secondary | ICD-10-CM

## 2021-10-02 DIAGNOSIS — Z7982 Long term (current) use of aspirin: Secondary | ICD-10-CM

## 2021-10-02 DIAGNOSIS — R278 Other lack of coordination: Secondary | ICD-10-CM | POA: Diagnosis present

## 2021-10-02 DIAGNOSIS — I63441 Cerebral infarction due to embolism of right cerebellar artery: Principal | ICD-10-CM | POA: Diagnosis present

## 2021-10-02 DIAGNOSIS — Z90710 Acquired absence of both cervix and uterus: Secondary | ICD-10-CM

## 2021-10-02 DIAGNOSIS — R001 Bradycardia, unspecified: Secondary | ICD-10-CM | POA: Diagnosis not present

## 2021-10-02 DIAGNOSIS — M549 Dorsalgia, unspecified: Secondary | ICD-10-CM | POA: Diagnosis not present

## 2021-10-02 DIAGNOSIS — R29810 Facial weakness: Secondary | ICD-10-CM | POA: Diagnosis present

## 2021-10-02 DIAGNOSIS — E876 Hypokalemia: Secondary | ICD-10-CM | POA: Diagnosis present

## 2021-10-02 DIAGNOSIS — R1310 Dysphagia, unspecified: Secondary | ICD-10-CM | POA: Diagnosis present

## 2021-10-02 DIAGNOSIS — R400 Somnolence: Secondary | ICD-10-CM | POA: Diagnosis present

## 2021-10-02 DIAGNOSIS — Z881 Allergy status to other antibiotic agents status: Secondary | ICD-10-CM

## 2021-10-02 DIAGNOSIS — J9602 Acute respiratory failure with hypercapnia: Secondary | ICD-10-CM | POA: Diagnosis not present

## 2021-10-02 DIAGNOSIS — E039 Hypothyroidism, unspecified: Secondary | ICD-10-CM | POA: Diagnosis present

## 2021-10-02 DIAGNOSIS — R4701 Aphasia: Secondary | ICD-10-CM | POA: Diagnosis present

## 2021-10-02 DIAGNOSIS — R262 Difficulty in walking, not elsewhere classified: Secondary | ICD-10-CM | POA: Diagnosis present

## 2021-10-02 DIAGNOSIS — I672 Cerebral atherosclerosis: Secondary | ICD-10-CM | POA: Diagnosis present

## 2021-10-02 DIAGNOSIS — F17210 Nicotine dependence, cigarettes, uncomplicated: Secondary | ICD-10-CM | POA: Diagnosis present

## 2021-10-02 DIAGNOSIS — Z681 Body mass index (BMI) 19 or less, adult: Secondary | ICD-10-CM

## 2021-10-02 DIAGNOSIS — E46 Unspecified protein-calorie malnutrition: Secondary | ICD-10-CM | POA: Diagnosis present

## 2021-10-02 DIAGNOSIS — Z9884 Bariatric surgery status: Secondary | ICD-10-CM

## 2021-10-02 DIAGNOSIS — I6503 Occlusion and stenosis of bilateral vertebral arteries: Secondary | ICD-10-CM | POA: Diagnosis present

## 2021-10-02 DIAGNOSIS — I160 Hypertensive urgency: Secondary | ICD-10-CM | POA: Diagnosis present

## 2021-10-02 DIAGNOSIS — G894 Chronic pain syndrome: Secondary | ICD-10-CM | POA: Diagnosis present

## 2021-10-02 DIAGNOSIS — E785 Hyperlipidemia, unspecified: Secondary | ICD-10-CM | POA: Diagnosis present

## 2021-10-02 DIAGNOSIS — Z888 Allergy status to other drugs, medicaments and biological substances status: Secondary | ICD-10-CM

## 2021-10-02 DIAGNOSIS — I110 Hypertensive heart disease with heart failure: Secondary | ICD-10-CM | POA: Diagnosis present

## 2021-10-02 DIAGNOSIS — Z20822 Contact with and (suspected) exposure to covid-19: Secondary | ICD-10-CM | POA: Diagnosis present

## 2021-10-02 DIAGNOSIS — Z66 Do not resuscitate: Secondary | ICD-10-CM | POA: Diagnosis not present

## 2021-10-02 DIAGNOSIS — Z880 Allergy status to penicillin: Secondary | ICD-10-CM

## 2021-10-02 DIAGNOSIS — I4891 Unspecified atrial fibrillation: Secondary | ICD-10-CM | POA: Diagnosis present

## 2021-10-02 DIAGNOSIS — I5032 Chronic diastolic (congestive) heart failure: Secondary | ICD-10-CM | POA: Diagnosis present

## 2021-10-02 DIAGNOSIS — R29704 NIHSS score 4: Secondary | ICD-10-CM | POA: Diagnosis present

## 2021-10-02 LAB — COMPREHENSIVE METABOLIC PANEL
ALT: 24 U/L (ref 0–44)
AST: 25 U/L (ref 15–41)
Albumin: 4.1 g/dL (ref 3.5–5.0)
Alkaline Phosphatase: 115 U/L (ref 38–126)
Anion gap: 10 (ref 5–15)
BUN: 10 mg/dL (ref 8–23)
CO2: 24 mmol/L (ref 22–32)
Calcium: 9 mg/dL (ref 8.9–10.3)
Chloride: 101 mmol/L (ref 98–111)
Creatinine, Ser: 0.46 mg/dL (ref 0.44–1.00)
GFR, Estimated: >60 mL/min (ref >60)
Glucose, Bld: 168 mg/dL — ABNORMAL HIGH (ref 70–99)
Potassium: 3.5 mmol/L (ref 3.5–5.1)
Sodium: 135 mmol/L (ref 135–145)
Total Bilirubin: 0.5 mg/dL (ref 0.3–1.2)
Total Protein: 6.5 g/dL (ref 6.5–8.1)

## 2021-10-02 LAB — CBC WITH DIFFERENTIAL/PLATELET
Abs Immature Granulocytes: 0.04 10*3/uL (ref 0.00–0.07)
Basophils Absolute: 0 10*3/uL (ref 0.0–0.1)
Basophils Relative: 0 %
Eosinophils Absolute: 0 10*3/uL (ref 0.0–0.5)
Eosinophils Relative: 0 %
HCT: 42.7 % (ref 36.0–46.0)
Hemoglobin: 14.4 g/dL (ref 12.0–15.0)
Immature Granulocytes: 0 %
Lymphocytes Relative: 6 %
Lymphs Abs: 0.5 10*3/uL — ABNORMAL LOW (ref 0.7–4.0)
MCH: 31.9 pg (ref 26.0–34.0)
MCHC: 33.7 g/dL (ref 30.0–36.0)
MCV: 94.5 fL (ref 80.0–100.0)
Monocytes Absolute: 0.3 10*3/uL (ref 0.1–1.0)
Monocytes Relative: 3 %
Neutro Abs: 8.1 10*3/uL — ABNORMAL HIGH (ref 1.7–7.7)
Neutrophils Relative %: 91 %
Platelets: 188 10*3/uL (ref 150–400)
RBC: 4.52 MIL/uL (ref 3.87–5.11)
RDW: 12.3 % (ref 11.5–15.5)
WBC: 9 10*3/uL (ref 4.0–10.5)
nRBC: 0 % (ref 0.0–0.2)

## 2021-10-02 LAB — LIPASE, BLOOD: Lipase: 21 U/L (ref 11–51)

## 2021-10-02 LAB — RESP PANEL BY RT-PCR (FLU A&B, COVID) ARPGX2
Influenza A by PCR: NEGATIVE
Influenza B by PCR: NEGATIVE
SARS Coronavirus 2 by RT PCR: NEGATIVE

## 2021-10-02 MED ORDER — MECLIZINE HCL 25 MG PO TABS
12.5000 mg | ORAL_TABLET | Freq: Once | ORAL | Status: AC
Start: 2021-10-02 — End: 2021-10-02
  Administered 2021-10-02: 12.5 mg via ORAL
  Filled 2021-10-02: qty 1

## 2021-10-02 MED ORDER — SODIUM CHLORIDE 0.9 % IV BOLUS
1000.0000 mL | Freq: Once | INTRAVENOUS | Status: AC
  Administered 2021-10-02: 1000 mL via INTRAVENOUS

## 2021-10-02 MED ORDER — IOHEXOL 350 MG/ML SOLN
100.0000 mL | Freq: Once | INTRAVENOUS | Status: AC | PRN
  Administered 2021-10-02: 75 mL via INTRAVENOUS

## 2021-10-02 MED ORDER — MORPHINE SULFATE (PF) 2 MG/ML IV SOLN
2.0000 mg | Freq: Once | INTRAVENOUS | Status: AC
  Administered 2021-10-02: 2 mg via INTRAVENOUS
  Filled 2021-10-02: qty 1

## 2021-10-02 MED ORDER — ONDANSETRON HCL 4 MG/2ML IJ SOLN
4.0000 mg | Freq: Once | INTRAMUSCULAR | Status: AC
  Administered 2021-10-02: 4 mg via INTRAVENOUS
  Filled 2021-10-02: qty 2

## 2021-10-02 NOTE — ED Provider Notes (Signed)
[facility] EMERGENCY DEPARTMENT Provider Note   CSN: [id number] Arrival date & time: 22-Oct-2021  1854     History  Chief Complaint  Patient presents with   Dizziness    [name] is a 76 y.o. female.  Patient as above with significant medical history as below, including COPD, GERD, thyroid disease who presents to the ED with complaint of dizziness, headache.  Patient reports sudden onset dizziness after eating lunch today.  Felt as though the room was spinning.  She also has been having a headache, she states her headache feels as this was worse.  Right-sided temporal, throbbing.  No neck discomfort.  No numbness or tingling that seems new.  No pain with eye movement.  Is feeling very nauseated when she turns her head or attempt to ambulate.  No medications prior to arrival.    She has been feeling intermittently dizzy over the past 2 weeks and stopped taking her antihypertensives because she thought it was provoking her symptoms.   >>Dizziness and HA acutely worsened around 12 pm today. LKN at that time (around 12p)   Past Medical History:  Diagnosis Date   Acute bronchitis    Anemia    Arthritis    Chronic rhinitis    COPD (chronic obstructive pulmonary disease) (HCC)    GERD (gastroesophageal reflux disease)    Headache    Hyperthyroidism 2012   thyroidectomy   Hypothyroidism    Thyroid disease    Tobacco use disorder     Past Surgical History:  Procedure Laterality Date   ESOPHAGOGASTRODUODENOSCOPY  10/22/2011   Procedure: ESOPHAGOGASTRODUODENOSCOPY (EGD);  Surgeon: Willis Modena, MD;  Location: [facility] ENDOSCOPY;  Service: Endoscopy;  Laterality: Left;   ESOPHAGOGASTRODUODENOSCOPY N/A 07/24/2014   Procedure: ESOPHAGOGASTRODUODENOSCOPY (EGD);  Surgeon: Charolett Bumpers, MD;  Location: [facility] ENDOSCOPY;  Service: Endoscopy;  Laterality: N/A;   GASTRIC OUTLET OBSTRUCTION RELEASE     7 years ago   PARTIAL GASTRECTOMY N/A 09/25/2014   Procedure: subtotal  gastrectomy resection gastric jejunal anastamosis feeding jejunostomy roux -en-y reconstruction;  Surgeon: Glenna Fellows, MD;  Location: [facility] ORS;  Service: General;  Laterality: N/A;   THYROIDECTOMY  2012   TOTAL ABDOMINAL HYSTERECTOMY       The history is provided by the patient and a relative. No language interpreter was used.  Dizziness Associated symptoms: headaches, nausea and vomiting   Associated symptoms: no chest pain, no palpitations and no shortness of breath        Home Medications Prior to Admission medications   Medication Sig Start Date End Date Taking? Authorizing Provider  Aspirin-Caffeine (BC FAST PAIN RELIEF PO) Take 1 packet by mouth daily.    [provider]  Budeson-Glycopyrrol-Formoterol (BREZTRI AEROSPHERE) 160-9-4.8 MCG/ACT AERO Inhale 2 puffs into the lungs in the morning and at bedtime. 09/16/21   Waymon Budge, MD  Butalbital-APAP-Caff-Cod 50-300-40-30 MG CAPS 1 every 4 hours only if needed for pain Patient taking differently: Take 1 capsule by mouth every 4 (four) hours as needed (pain). 06/09/18   Jetty Duhamel D, MD  esomeprazole (NEXIUM) 40 MG capsule Take 40 mg by mouth daily.    [provider]  Ferrous Sulfate (SLOW FE PO) Take 1 tablet by mouth in the morning and at bedtime.    [provider]  Fluticasone-Umeclidin-Vilant (TRELEGY ELLIPTA) 100-62.5-25 MCG/INH AEPB Inhale 1 puff into the lungs daily. Patient not taking: Reported on 09/16/2021 07/09/20   Waymon Budge, MD  Fluticasone-Umeclidin-Vilant (TRELEGY ELLIPTA) 100-62.5-25  MCG/INH AEPB Inhale 1 puff into the lungs daily. Patient not taking: Reported on 10/10/2020 07/25/20   Jetty Duhamel D, MD  HYDROcodone-acetaminophen (NORCO/VICODIN) 5-325 MG tablet Take 1-2 tablets by mouth every 4 (four) hours as needed for moderate pain.    [provider]  ibuprofen (ADVIL) 200 MG tablet Take 200 mg by mouth every 6 (six) hours as needed for mild pain.    [provider]  levothyroxine (SYNTHROID, LEVOTHROID) 125 MCG tablet Take 1 tablet by mouth daily. 03/18/15   [provider]  Multiple Vitamin (MULTIVITAMIN) capsule Take 1 capsule by mouth every morning.     [provider]  traMADol (ULTRAM) 50 MG tablet Take 50 mg by mouth every 6 (six) hours as needed for moderate pain. Osteoarthritis pain Takes with Vicodin    [provider]      Allergies    Doxycycline, Levofloxacin, Nsaids, Penicillins, and Prednisone    Review of Systems   Review of Systems  Constitutional:  Negative for activity change and fever.  HENT:  Negative for facial swelling and trouble swallowing.   Eyes:  Negative for discharge and redness.  Respiratory:  Negative for cough and shortness of breath.   Cardiovascular:  Negative for chest pain and palpitations.  Gastrointestinal:  Positive for nausea and vomiting. Negative for abdominal pain.  Genitourinary:  Negative for dysuria and flank pain.  Musculoskeletal:  Negative for back pain and gait problem.  Skin:  Negative for pallor and rash.  Neurological:  Positive for dizziness and headaches. Negative for syncope.    Physical Exam Updated Vital Signs BP (!) 185/74   Pulse (!) 45   Temp (!) 97.5 F (36.4 C) (Oral)   Resp 15   SpO2 97%  Physical Exam Vitals and nursing note reviewed.  Constitutional:      General: She is not in acute distress.    Appearance: Normal appearance. She is not ill-appearing.  HENT:     Head: Normocephalic and atraumatic.     Right Ear: External ear normal.     Left Ear: External ear normal.     Nose: Nose normal.     Mouth/Throat:     Mouth: Mucous membranes are moist.  Eyes:     General: No scleral icterus.       Right eye: No discharge.        Left eye: No discharge.     Extraocular Movements: Extraocular movements intact.     Pupils: Pupils are equal, round, and reactive to light.  Neck:     Vascular: No carotid bruit.     Trachea: Trachea  normal.  Cardiovascular:     Rate and Rhythm: Normal rate and regular rhythm.     Pulses: Normal pulses.     Heart sounds: Normal heart sounds.  Pulmonary:     Effort: Pulmonary effort is normal. No respiratory distress.     Breath sounds: Normal breath sounds.  Abdominal:     General: Abdomen is flat.     Tenderness: There is no abdominal tenderness.  Musculoskeletal:        General: Normal range of motion.     Cervical back: Full passive range of motion without pain and normal range of motion.     Right lower leg: No edema.     Left lower leg: No edema.  Skin:    General: Skin is warm and dry.     Capillary Refill: Capillary refill takes less than 2 seconds.  Neurological:     Mental Status: She is alert and oriented to person, place, and time.     GCS: GCS eye subscore is 4. GCS verbal subscore is 5. GCS motor subscore is 6.     Cranial Nerves: Cranial nerves 2-12 are intact. No dysarthria or facial asymmetry.     Sensory: Sensation is intact.     Motor: Motor function is intact. No tremor.     Coordination: Coordination is intact. Finger-Nose-Finger Test normal.     Comments: Strength 5/5 BL UE  Strength 5/5 BL LE  Psychiatric:        Mood and Affect: Mood normal.        Behavior: Behavior normal.     ED Results / Procedures / Treatments   Labs (all labs ordered are listed, but only abnormal results are displayed) Labs Reviewed  CBC WITH DIFFERENTIAL/PLATELET - Abnormal; Notable for the following components:      Result Value   Neutro Abs 8.1 (*)    Lymphs Abs 0.5 (*)    All other components within normal limits  COMPREHENSIVE METABOLIC PANEL - Abnormal; Notable for the following components:   Glucose, Bld 168 (*)    All other components within normal limits  RESP PANEL BY RT-PCR (FLU A&B, COVID) ARPGX2  LIPASE, BLOOD  URINALYSIS, ROUTINE W REFLEX MICROSCOPIC    EKG None  Radiology CT ANGIO HEAD NECK W WO CM  Result Date: 10/03/2021 CLINICAL DATA:   Dizziness EXAM: CT ANGIOGRAPHY HEAD AND NECK TECHNIQUE: Multidetector CT imaging of the head and neck was performed using the standard protocol during bolus administration of intravenous contrast. Multiplanar CT image reconstructions and MIPs were obtained to evaluate the vascular anatomy. Carotid stenosis measurements (when applicable) are obtained utilizing NASCET criteria, using the distal internal carotid diameter as the denominator. RADIATION DOSE REDUCTION: This exam was performed according to the departmental dose-optimization program which includes automated exposure control, adjustment of the mA and/or kV according to patient size and/or use of iterative reconstruction technique. CONTRAST:  47mL OMNIPAQUE IOHEXOL 350 MG/ML SOLN COMPARISON:  10/09/2020 FINDINGS: CT HEAD FINDINGS Brain: There is an acute/early subacute infarct of the right PICA territory. There are old bilateral cerebellar infarcts and an old right PCA territory infarct. The size and configuration of the ventricles and extra-axial CSF spaces are normal. There is hypoattenuation of the periventricular white matter, most commonly indicating chronic ischemic microangiopathy. Skull: The visualized skull base, calvarium and extracranial soft tissues are normal. Sinuses/Orbits: No fluid levels or advanced mucosal thickening of the visualized paranasal sinuses. No mastoid or middle ear effusion. The orbits are normal. CTA NECK FINDINGS SKELETON: There is no bony spinal canal stenosis. No lytic or blastic lesion. OTHER NECK: Normal pharynx, larynx and major salivary glands. No cervical lymphadenopathy. Unremarkable thyroid gland. UPPER CHEST: Biapical emphysema. AORTIC ARCH: There is calcific atherosclerosis of the aortic arch. There is no aneurysm, dissection or hemodynamically significant stenosis of the visualized portion of the aorta. Conventional 3 vessel aortic branching pattern. The visualized proximal subclavian arteries are widely patent.  RIGHT CAROTID SYSTEM: No dissection, occlusion or aneurysm. Mild atherosclerotic calcification at the carotid bifurcation without hemodynamically significant stenosis. LEFT CAROTID SYSTEM: Normal without aneurysm, dissection or stenosis. VERTEBRAL ARTERIES: The right vertebral artery V3 segment is occluded. The right V4 segment is incompletely opacified. The left vertebral artery is non dominant and there is multifocal occlusion or severe stenosis along the V2 segment. CTA HEAD FINDINGS POSTERIOR CIRCULATION: --Vertebral arteries: Normal V4 segments. --Inferior  cerebellar arteries: Right PICA not visualized. --Basilar artery: Normal. --Superior cerebellar arteries: Normal. --Posterior cerebral arteries (PCA): Mild stenosis of the right P2 segment. Severe stenosis of the left P2 segment. ANTERIOR CIRCULATION: --Intracranial internal carotid arteries: Normal. --Anterior cerebral arteries (ACA): Mild stenosis of the right A1 segment. Otherwise normal. --Middle cerebral arteries (MCA): Normal. VENOUS SINUSES: As permitted by contrast timing, patent. ANATOMIC VARIANTS: None Review of the MIP images confirms the above findings. IMPRESSION: 1. 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 No hemorrhage or mass effect. 2. 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 3. 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 4. Severe stenosis of the left PCA P2 segment. 5. Aortic Atherosclerosis (ICD10-I70.0) and Emphysema (ICD10-J43.9). 6. Critical Value/emergent results were called by telephone at the time of interpretation on 10/03/2021 at 12:06 am to provider Tanda Rockers , who verbally acknowledged these results. Electronically Signed   By: Deatra Robinson M.D.   On: 10/03/2021 00:07    Procedures .Critical Care  Performed by: Sloan Leiter, DO Authorized by: Sloan Leiter, DO    Critical care provider statement:    Critical care time (minutes):  30   Critical care time was exclusive of:  Separately billable procedures and treating other patients   Critical care was necessary to treat or prevent imminent or life-threatening deterioration of the following conditions:  CNS failure or compromise   Critical care was time spent personally by me on the following activities:  Development of treatment plan with patient or surrogate, discussions with consultants, evaluation of patient's response to treatment, examination of patient, ordering and review of laboratory studies, ordering and review of radiographic studies, ordering and performing treatments and interventions, pulse oximetry, re-evaluation of patient's condition, review of old charts and obtaining history from patient or surrogate     Medications Ordered in ED Medications  metoCLOPramide (REGLAN) injection 5 mg (has no administration in time range)  diphenhydrAMINE (BENADRYL) injection 12.5 mg (has no administration in time range)  sodium chloride 0.9 % bolus 1,000 mL (0 mLs Intravenous Stopped 09/24/2021 2329)  morphine (PF) 2 MG/ML injection 2 mg (2 mg Intravenous Given 09/15/2021 2019)  ondansetron (ZOFRAN) injection 4 mg (4 mg Intravenous Given 10/06/2021 2019)  meclizine (ANTIVERT) tablet 12.5 mg (12.5 mg Oral Given 10/05/2021 2020)  iohexol (OMNIPAQUE) 350 MG/ML injection 100 mL (75 mLs Intravenous Contrast Given 10/04/2021 2337)    ED Course/ Medical Decision Making/ A&P Clinical Course as of 10/03/21 0037  Fri Oct 03, 2021  0002 Spoke with radiology, pt with acute R vert v3 occlusion right pica  [SG]    Clinical Course User Index [SG] Sloan Leiter, DO                           Medical Decision Making Amount and/or Complexity of Data Reviewed Labs: ordered. Radiology: ordered.  Risk Prescription drug management.   This patient presents to the ED with chief complaint(s) of dizzy, ha, n /v  with pertinent  past medical history of chronic head/neck pain, copd which further complicates the presenting complaint. The complaint involves an extensive differential diagnosis and also carries with it a high risk of complications and morbidity.    Differential diagnosis includes but is not exclusive to subarachnoid hemorrhage, meningitis, encephalitis, previous head trauma, cavernous venous thrombosis, muscle tension headache, glaucoma, temporal arteritis,  migraine or migraine equivalent, etc.  . Serious etiologies were considered.   The initial plan is to screening labs, given ha with sudden onset dizziness, worse ha from prior will get ct imaging    Additional history obtained: Additional history obtained from family Records reviewed Primary Care Documents and prior ed visits, prior lab/imaging/home meds  Independent labs interpretation:  The following labs were independently interpreted:  Labs stable UA pending at shift change  Independent visualization of imaging: - I independently visualized the following imaging with scope of interpretation limited to determining acute life threatening conditions related to emergency care: CTA head/neck, which revealed acute/subacute infarct ; R PICA  Cardiac monitoring was reviewed and interpreted by myself which shows NSR  Treatment and Reassessment: IVF, zofran, antivert, morphine  Consultation: - Consulted or discussed management/test interpretation w/ external professional: pending neuro consult  Consideration for admission or further workup: Admission was considered   Pt with acute/subacute infarct as likely source of her symptoms today. Other than the dizzy sensation she has no focal deficits, does report some chronic blurry vision but not acutely worsened, no diplopia. Will plan for admission after d/w neurology. Signed out to incoming EDP pending admission, d/w consultant.    Social Determinants of health:  Counseled patient for approximately  3 minutes regarding smoking cessation. Discussed risks of smoking and how they applied and affected their visit here today. Patient not ready to quit at this time, however will follow up with their primary doctor when they are.   CPT code: [id number]: intermediate counseling for smoking cessation   Social History   Tobacco Use   Smoking status: Every Day    Packs/day: 0.50    Years: 40.00    Total pack years: 20.00    Types: Cigarettes   Smokeless tobacco: Never   Tobacco comments:    1/2 ppd reported 09/16/2021  Vaping Use   Vaping Use: Never used  Substance Use Topics   Alcohol use: Yes    Comment: rarely   Drug use: No            Final Clinical Impression(s) / ED Diagnoses Final diagnoses:  Dizziness  Cerebrovascular accident (CVA), unspecified mechanism ([place])    Rx / DC Orders ED Discharge Orders     None         Jeanell Sparrow, DO 10/03/21 0037

## 2021-10-02 NOTE — ED Triage Notes (Signed)
Pt bib [facility] from home c/o dizziness and nausea since lunch time today. Just finished treatment for sinus infection today. Finished Zpack today. Feels like the room is spinning around her, did have an episode of vomiting. Pt has HTN, stopped taking meds 2 weeks ago d/y dizziness. EMS gave 4mg  zofran and 500cc NS  EMS vitals 210/90 BP 84 HR 20 RR 97% O2 167 CBG 18g Lt AC

## 2021-10-03 ENCOUNTER — Inpatient Hospital Stay (HOSPITAL_COMMUNITY): Payer: Medicare Other

## 2021-10-03 ENCOUNTER — Encounter (HOSPITAL_COMMUNITY): Payer: Self-pay | Admitting: Family Medicine

## 2021-10-03 ENCOUNTER — Emergency Department (HOSPITAL_COMMUNITY): Payer: Medicare Other

## 2021-10-03 DIAGNOSIS — E0590 Thyrotoxicosis, unspecified without thyrotoxic crisis or storm: Secondary | ICD-10-CM | POA: Diagnosis not present

## 2021-10-03 DIAGNOSIS — E039 Hypothyroidism, unspecified: Secondary | ICD-10-CM | POA: Diagnosis present

## 2021-10-03 DIAGNOSIS — I110 Hypertensive heart disease with heart failure: Secondary | ICD-10-CM | POA: Diagnosis present

## 2021-10-03 DIAGNOSIS — Z79891 Long term (current) use of opiate analgesic: Secondary | ICD-10-CM | POA: Diagnosis not present

## 2021-10-03 DIAGNOSIS — I6389 Other cerebral infarction: Secondary | ICD-10-CM

## 2021-10-03 DIAGNOSIS — Z20822 Contact with and (suspected) exposure to covid-19: Secondary | ICD-10-CM | POA: Diagnosis present

## 2021-10-03 DIAGNOSIS — R64 Cachexia: Secondary | ICD-10-CM | POA: Diagnosis present

## 2021-10-03 DIAGNOSIS — I6503 Occlusion and stenosis of bilateral vertebral arteries: Secondary | ICD-10-CM | POA: Diagnosis present

## 2021-10-03 DIAGNOSIS — I639 Cerebral infarction, unspecified: Secondary | ICD-10-CM | POA: Diagnosis not present

## 2021-10-03 DIAGNOSIS — I672 Cerebral atherosclerosis: Secondary | ICD-10-CM | POA: Diagnosis present

## 2021-10-03 DIAGNOSIS — J439 Emphysema, unspecified: Secondary | ICD-10-CM | POA: Diagnosis present

## 2021-10-03 DIAGNOSIS — Z79890 Hormone replacement therapy (postmenopausal): Secondary | ICD-10-CM | POA: Diagnosis not present

## 2021-10-03 DIAGNOSIS — Z66 Do not resuscitate: Secondary | ICD-10-CM | POA: Diagnosis not present

## 2021-10-03 DIAGNOSIS — G894 Chronic pain syndrome: Secondary | ICD-10-CM | POA: Diagnosis not present

## 2021-10-03 DIAGNOSIS — Z7189 Other specified counseling: Secondary | ICD-10-CM | POA: Diagnosis not present

## 2021-10-03 DIAGNOSIS — R42 Dizziness and giddiness: Secondary | ICD-10-CM | POA: Diagnosis present

## 2021-10-03 DIAGNOSIS — I4891 Unspecified atrial fibrillation: Secondary | ICD-10-CM | POA: Diagnosis present

## 2021-10-03 DIAGNOSIS — G936 Cerebral edema: Secondary | ICD-10-CM | POA: Diagnosis present

## 2021-10-03 DIAGNOSIS — I5032 Chronic diastolic (congestive) heart failure: Secondary | ICD-10-CM | POA: Diagnosis present

## 2021-10-03 DIAGNOSIS — J9602 Acute respiratory failure with hypercapnia: Secondary | ICD-10-CM | POA: Diagnosis not present

## 2021-10-03 DIAGNOSIS — Z7982 Long term (current) use of aspirin: Secondary | ICD-10-CM | POA: Diagnosis not present

## 2021-10-03 DIAGNOSIS — I63441 Cerebral infarction due to embolism of right cerebellar artery: Secondary | ICD-10-CM | POA: Diagnosis present

## 2021-10-03 DIAGNOSIS — R1310 Dysphagia, unspecified: Secondary | ICD-10-CM | POA: Diagnosis present

## 2021-10-03 DIAGNOSIS — Z515 Encounter for palliative care: Secondary | ICD-10-CM | POA: Diagnosis not present

## 2021-10-03 DIAGNOSIS — R29810 Facial weakness: Secondary | ICD-10-CM | POA: Diagnosis present

## 2021-10-03 DIAGNOSIS — R4701 Aphasia: Secondary | ICD-10-CM | POA: Diagnosis present

## 2021-10-03 DIAGNOSIS — I160 Hypertensive urgency: Secondary | ICD-10-CM | POA: Diagnosis present

## 2021-10-03 DIAGNOSIS — E46 Unspecified protein-calorie malnutrition: Secondary | ICD-10-CM | POA: Diagnosis present

## 2021-10-03 DIAGNOSIS — Z681 Body mass index (BMI) 19 or less, adult: Secondary | ICD-10-CM | POA: Diagnosis not present

## 2021-10-03 DIAGNOSIS — Z903 Acquired absence of stomach [part of]: Secondary | ICD-10-CM | POA: Diagnosis not present

## 2021-10-03 LAB — COMPREHENSIVE METABOLIC PANEL
ALT: 25 U/L (ref 0–44)
AST: 24 U/L (ref 15–41)
Albumin: 4 g/dL (ref 3.5–5.0)
Alkaline Phosphatase: 126 U/L (ref 38–126)
Anion gap: 12 (ref 5–15)
BUN: 11 mg/dL (ref 8–23)
CO2: 23 mmol/L (ref 22–32)
Calcium: 8.7 mg/dL — ABNORMAL LOW (ref 8.9–10.3)
Chloride: 99 mmol/L (ref 98–111)
Creatinine, Ser: 0.47 mg/dL (ref 0.44–1.00)
GFR, Estimated: >60 mL/min (ref >60)
Glucose, Bld: 132 mg/dL — ABNORMAL HIGH (ref 70–99)
Potassium: 3.3 mmol/L — ABNORMAL LOW (ref 3.5–5.1)
Sodium: 134 mmol/L — ABNORMAL LOW (ref 135–145)
Total Bilirubin: 0.6 mg/dL (ref 0.3–1.2)
Total Protein: 6.4 g/dL — ABNORMAL LOW (ref 6.5–8.1)

## 2021-10-03 LAB — SEDIMENTATION RATE: Sed Rate: 1 mm/hr (ref 0–22)

## 2021-10-03 LAB — ECHOCARDIOGRAM COMPLETE
Area-P 1/2: 1.94 cm2
Calc EF: 58.6 %
S' Lateral: 2.6 cm
Single Plane A2C EF: 58.3 %
Single Plane A4C EF: 56.2 %

## 2021-10-03 LAB — LIPID PANEL
Cholesterol: 231 mg/dL — ABNORMAL HIGH (ref 0–200)
HDL: 93 mg/dL (ref >40)
LDL Cholesterol: 129 mg/dL — ABNORMAL HIGH (ref 0–99)
Total CHOL/HDL Ratio: 2.5 RATIO
Triglycerides: 46 mg/dL (ref <150)
VLDL: 9 mg/dL (ref 0–40)

## 2021-10-03 LAB — CBC
HCT: 42 % (ref 36.0–46.0)
Hemoglobin: 14.4 g/dL (ref 12.0–15.0)
MCH: 32.1 pg (ref 26.0–34.0)
MCHC: 34.3 g/dL (ref 30.0–36.0)
MCV: 93.8 fL (ref 80.0–100.0)
Platelets: 180 10*3/uL (ref 150–400)
RBC: 4.48 MIL/uL (ref 3.87–5.11)
RDW: 12.2 % (ref 11.5–15.5)
WBC: 8.2 10*3/uL (ref 4.0–10.5)
nRBC: 0 % (ref 0.0–0.2)

## 2021-10-03 LAB — URINALYSIS, ROUTINE W REFLEX MICROSCOPIC
Bilirubin Urine: NEGATIVE
Glucose, UA: NEGATIVE mg/dL
Ketones, ur: 5 mg/dL — AB
Leukocytes,Ua: NEGATIVE
Nitrite: NEGATIVE
Protein, ur: 30 mg/dL — AB
RBC / HPF: >50 RBC/hpf — ABNORMAL HIGH (ref 0–5)
Specific Gravity, Urine: 1.015 (ref 1.005–1.030)
pH: 7 (ref 5.0–8.0)

## 2021-10-03 LAB — TSH: TSH: <0.01 u[IU]/mL — ABNORMAL LOW (ref 0.350–4.500)

## 2021-10-03 LAB — RAPID URINE DRUG SCREEN, HOSP PERFORMED
Amphetamines: NOT DETECTED
Barbiturates: POSITIVE — AB
Benzodiazepines: NOT DETECTED
Cocaine: NOT DETECTED
Opiates: POSITIVE — AB
Tetrahydrocannabinol: NOT DETECTED

## 2021-10-03 LAB — HEMOGLOBIN A1C
Hgb A1c MFr Bld: 5.3 % (ref 4.8–5.6)
Mean Plasma Glucose: 105 mg/dL

## 2021-10-03 LAB — RPR: RPR Ser Ql: NONREACTIVE

## 2021-10-03 LAB — VITAMIN B12: Vitamin B-12: 274 pg/mL (ref 180–914)

## 2021-10-03 MED ORDER — LABETALOL HCL 5 MG/ML IV SOLN: 10.0000 mg | INTRAVENOUS | Status: DC | PRN

## 2021-10-03 MED ORDER — UMECLIDINIUM BROMIDE 62.5 MCG/ACT IN AEPB
1.0000 | INHALATION_SPRAY | Freq: Every day | RESPIRATORY_TRACT | Status: DC
  Administered 2021-10-03 – 2021-10-05 (×3): 1 via RESPIRATORY_TRACT
  Filled 2021-10-03: qty 7

## 2021-10-03 MED ORDER — HYDRALAZINE HCL 10 MG PO TABS: 10.0000 mg | ORAL_TABLET | Freq: Three times a day (TID) | ORAL | Status: DC

## 2021-10-03 MED ORDER — LEVOTHYROXINE SODIUM 100 MCG PO TABS
100.0000 ug | ORAL_TABLET | Freq: Every day | ORAL | Status: DC
  Administered 2021-10-03 – 2021-10-04 (×2): 100 ug via ORAL
  Filled 2021-10-03 (×2): qty 1

## 2021-10-03 MED ORDER — ENOXAPARIN SODIUM 30 MG/0.3ML IJ SOSY
30.0000 mg | PREFILLED_SYRINGE | Freq: Every day | INTRAMUSCULAR | Status: DC
  Administered 2021-10-03 – 2021-10-05 (×3): 30 mg via SUBCUTANEOUS
  Filled 2021-10-03 (×3): qty 0.3

## 2021-10-03 MED ORDER — TRAMADOL HCL 50 MG PO TABS
25.0000 mg | ORAL_TABLET | Freq: Four times a day (QID) | ORAL | Status: DC | PRN
  Administered 2021-10-03 – 2021-10-05 (×5): 25 mg via ORAL
  Filled 2021-10-03 (×5): qty 1

## 2021-10-03 MED ORDER — ACETAMINOPHEN 325 MG PO TABS
650.0000 mg | ORAL_TABLET | ORAL | Status: DC | PRN
  Administered 2021-10-05: 650 mg via ORAL
  Filled 2021-10-03: qty 2

## 2021-10-03 MED ORDER — BUDESON-GLYCOPYRROL-FORMOTEROL 160-9-4.8 MCG/ACT IN AERO: 2.0000 | INHALATION_SPRAY | Freq: Two times a day (BID) | RESPIRATORY_TRACT | Status: DC

## 2021-10-03 MED ORDER — FLUTICASONE FUROATE-VILANTEROL 100-25 MCG/ACT IN AEPB
1.0000 | INHALATION_SPRAY | Freq: Every day | RESPIRATORY_TRACT | Status: DC
  Administered 2021-10-03 – 2021-10-05 (×3): 1 via RESPIRATORY_TRACT
  Filled 2021-10-03 (×2): qty 28

## 2021-10-03 MED ORDER — AMLODIPINE BESYLATE 10 MG PO TABS
10.0000 mg | ORAL_TABLET | Freq: Every day | ORAL | Status: DC
  Administered 2021-10-04 – 2021-10-05 (×2): 10 mg via ORAL
  Filled 2021-10-03 (×3): qty 1

## 2021-10-03 MED ORDER — ACETAMINOPHEN 160 MG/5ML PO SOLN: 650.0000 mg | ORAL | Status: DC | PRN

## 2021-10-03 MED ORDER — METOCLOPRAMIDE HCL 5 MG/ML IJ SOLN
5.0000 mg | Freq: Once | INTRAMUSCULAR | Status: AC
  Administered 2021-10-03: 5 mg via INTRAVENOUS
  Filled 2021-10-03: qty 2

## 2021-10-03 MED ORDER — DIPHENHYDRAMINE HCL 50 MG/ML IJ SOLN
12.5000 mg | Freq: Once | INTRAMUSCULAR | Status: AC
  Administered 2021-10-03: 12.5 mg via INTRAVENOUS
  Filled 2021-10-03: qty 1

## 2021-10-03 MED ORDER — ASPIRIN 300 MG RE SUPP: 300.0000 mg | Freq: Every day | RECTAL | Status: DC

## 2021-10-03 MED ORDER — MECLIZINE HCL 12.5 MG PO TABS
12.5000 mg | ORAL_TABLET | Freq: Three times a day (TID) | ORAL | Status: DC | PRN
  Administered 2021-10-03: 12.5 mg via ORAL
  Filled 2021-10-03: qty 1

## 2021-10-03 MED ORDER — ROSUVASTATIN CALCIUM 20 MG PO TABS
40.0000 mg | ORAL_TABLET | Freq: Every day | ORAL | Status: DC
  Administered 2021-10-03 – 2021-10-05 (×3): 40 mg via ORAL
  Filled 2021-10-03 (×4): qty 2

## 2021-10-03 MED ORDER — HYDRALAZINE HCL 10 MG PO TABS
10.0000 mg | ORAL_TABLET | Freq: Four times a day (QID) | ORAL | Status: DC | PRN
  Administered 2021-10-03: 10 mg via ORAL
  Filled 2021-10-03: qty 1

## 2021-10-03 MED ORDER — ACETAMINOPHEN 650 MG RE SUPP
650.0000 mg | RECTAL | Status: DC | PRN
  Administered 2021-10-07: 650 mg via RECTAL
  Filled 2021-10-03: qty 1

## 2021-10-03 MED ORDER — HYDROCODONE-ACETAMINOPHEN 5-325 MG PO TABS
0.5000 | ORAL_TABLET | Freq: Three times a day (TID) | ORAL | Status: DC | PRN
  Administered 2021-10-03 – 2021-10-04 (×2): 0.5 via ORAL
  Filled 2021-10-03 (×2): qty 1

## 2021-10-03 MED ORDER — CLOPIDOGREL BISULFATE 75 MG PO TABS
75.0000 mg | ORAL_TABLET | Freq: Every day | ORAL | Status: DC
  Administered 2021-10-03 – 2021-10-05 (×3): 75 mg via ORAL
  Filled 2021-10-03 (×4): qty 1

## 2021-10-03 MED ORDER — HYDRALAZINE HCL 20 MG/ML IJ SOLN
10.0000 mg | INTRAMUSCULAR | Status: DC | PRN
  Administered 2021-10-04 – 2021-10-05 (×2): 10 mg via INTRAVENOUS
  Filled 2021-10-03 (×2): qty 1

## 2021-10-03 MED ORDER — AMLODIPINE BESYLATE 5 MG PO TABS
5.0000 mg | ORAL_TABLET | Freq: Every day | ORAL | Status: DC
  Administered 2021-10-03: 5 mg via ORAL
  Filled 2021-10-03: qty 1

## 2021-10-03 MED ORDER — HYDRALAZINE HCL 25 MG PO TABS
25.0000 mg | ORAL_TABLET | Freq: Three times a day (TID) | ORAL | Status: DC
  Administered 2021-10-03 – 2021-10-05 (×7): 25 mg via ORAL
  Filled 2021-10-03 (×7): qty 1

## 2021-10-03 MED ORDER — STROKE: EARLY STAGES OF RECOVERY BOOK
Freq: Once | Status: AC
Start: 2021-10-04 — End: 2021-10-04
  Filled 2021-10-03: qty 1

## 2021-10-03 MED ORDER — ASPIRIN 81 MG PO TBEC: 81.0000 mg | DELAYED_RELEASE_TABLET | Freq: Every day | ORAL | Status: DC

## 2021-10-03 MED ORDER — ASPIRIN 325 MG PO TABS
325.0000 mg | ORAL_TABLET | Freq: Every day | ORAL | Status: DC
  Administered 2021-10-03: 325 mg via ORAL
  Filled 2021-10-03: qty 1

## 2021-10-03 NOTE — H&P (Addendum)
History and Physical    [name] JPV:[id number] DOB: 1945-11-04 DOA: 09/14/2021  PCP: Wenda Low, MD   Patient coming from: Home   Chief Complaint: Dizziness, N/V   HPI: [name] is a 76 y.o. female with medical history significant for chronic pain, COPD, hypothyroidism, hypertension, and tobacco abuse, presenting to the emergency department with dizziness, nausea, and vomiting.  Patient reports that she woke in her usual state of health the morning of 09/09/2021, but then developed a room spinning sensation with nausea around noon.  Symptoms persisted, she was having difficulty ambulating due to the symptoms, had 1 episode of vomiting, and came to the ED for evaluation.  She reports discontinuing her blood pressure medications roughly 2 weeks ago due to dizziness.  She also notes that she just completed a course of azithromycin for sinus infection.  ED Course: Upon arrival to the ED, patient is found to be afebrile and saturating well on room air with elevated blood pressures.  CT head and CTA head and neck were obtained in the emergency department and most notable for acute to early subacute infarct in the right PICA territory, occlusion of right V3 segment with incomplete opacification of right V4 and right PICA origin occlusion.  Neurology was consulted by the ED physician and the patient was treated with a liter of saline, Zofran, morphine, and meclizine in the ED.  Review of Systems:  All other systems reviewed and apart from HPI, are negative.  Past Medical History:  Diagnosis Date   Acute bronchitis    Anemia    Arthritis    Chronic rhinitis    COPD (chronic obstructive pulmonary disease) (HCC)    GERD (gastroesophageal reflux disease)    Headache    Hyperthyroidism 2012   thyroidectomy   Hypothyroidism    Thyroid disease    Tobacco use disorder     Past Surgical History:  Procedure Laterality Date   ESOPHAGOGASTRODUODENOSCOPY  10/22/2011   Procedure:  ESOPHAGOGASTRODUODENOSCOPY (EGD);  Surgeon: Arta Silence, MD;  Location: [facility] ENDOSCOPY;  Service: Endoscopy;  Laterality: Left;   ESOPHAGOGASTRODUODENOSCOPY N/A 07/24/2014   Procedure: ESOPHAGOGASTRODUODENOSCOPY (EGD);  Surgeon: Garlan Fair, MD;  Location: [facility] ENDOSCOPY;  Service: Endoscopy;  Laterality: N/A;   GASTRIC OUTLET OBSTRUCTION RELEASE     7 years ago   PARTIAL GASTRECTOMY N/A 09/25/2014   Procedure: subtotal gastrectomy resection gastric jejunal anastamosis feeding jejunostomy roux -en-y reconstruction;  Surgeon: Excell Seltzer, MD;  Location: [facility] ORS;  Service: General;  Laterality: N/A;   THYROIDECTOMY  2012   TOTAL ABDOMINAL HYSTERECTOMY      Social History:   reports that she has been smoking cigarettes. She has a 20.00 pack-year smoking history. She has never used smokeless tobacco. She reports current alcohol use. She reports that she does not use drugs.  Allergies  Allergen Reactions   Doxycycline Itching   Levofloxacin     REACTION: GI upset, mouth/throat sores   Nsaids     Other reaction(s): gastric bleeding   Penicillins     REACTION: itch   Prednisone     Other reaction(s): stomach upset    Family History  Problem Relation Age of Onset   Cancer Father    Cirrhosis Mother    Breast cancer Neg Hx      Prior to Admission medications   Medication Sig Start Date End Date Taking? Authorizing Provider  Aspirin-Caffeine (BC FAST PAIN RELIEF PO) Take 1 packet by mouth daily.    [provider]  Budeson-Glycopyrrol-Formoterol (BREZTRI AEROSPHERE) 160-9-4.8 MCG/ACT AERO Inhale 2 puffs into the lungs in the morning and at bedtime. 09/16/21   Deneise Lever, MD  Butalbital-APAP-Caff-Cod 50-300-40-30 MG CAPS 1 every 4 hours only if needed for pain Patient taking differently: Take 1 capsule by mouth every 4 (four) hours as needed (pain). 06/09/18   Baird Lyons D, MD  esomeprazole (NEXIUM) 40 MG capsule Take 40 mg by mouth daily.    [provider]  Ferrous Sulfate (SLOW FE PO) Take 1 tablet by mouth in the morning and at bedtime.    [provider]  Fluticasone-Umeclidin-Vilant (TRELEGY ELLIPTA) 100-62.5-25 MCG/INH AEPB Inhale 1 puff into the lungs daily. Patient not taking: Reported on 09/16/2021 07/09/20   Deneise Lever, MD  Fluticasone-Umeclidin-Vilant (TRELEGY ELLIPTA) 100-62.5-25 MCG/INH AEPB Inhale 1 puff into the lungs daily. Patient not taking: Reported on 10/10/2020 07/25/20   Baird Lyons D, MD  HYDROcodone-acetaminophen (NORCO/VICODIN) 5-325 MG tablet Take 1-2 tablets by mouth every 4 (four) hours as needed for moderate pain.    [provider]  ibuprofen (ADVIL) 200 MG tablet Take 200 mg by mouth every 6 (six) hours as needed for mild pain.    [provider]  levothyroxine (SYNTHROID, LEVOTHROID) 125 MCG tablet Take 1 tablet by mouth daily. 03/18/15   [provider]  Multiple Vitamin (MULTIVITAMIN) capsule Take 1 capsule by mouth every morning.     [provider]  traMADol (ULTRAM) 50 MG tablet Take 50 mg by mouth every 6 (six) hours as needed for moderate pain. Osteoarthritis pain Takes with Vicodin    [provider]    Physical Exam: Vitals:   10/03/21 0200 10/03/21 0215 10/03/21 0230 10/03/21 0245  BP: (!) 198/175 (!) 208/80 (!) 164/98 (!) 174/85  Pulse: 82 76 77 72  Resp: '16 16 14 ' (!) 21  Temp:      TempSrc:      SpO2: 97% 96% 96% 97%    Constitutional: NAD, calm  Eyes: PERTLA, lids and conjunctivae normal ENMT: Mucous membranes are moist. Posterior pharynx clear of any exudate or lesions.   Neck: supple, no masses  Respiratory: no wheezing, no crackles. No accessory muscle use.  Cardiovascular: S1 & S2 heard, regular rate and rhythm. No extremity edema.   Abdomen: No distension, no tenderness, soft. Bowel sounds active.  Musculoskeletal: no clubbing / cyanosis. No joint deformity upper and lower extremities.   Skin: no significant rashes, lesions,  ulcers. Warm, dry, well-perfused. Neurologic: CN 2-12 grossly intact. Sensation to light touch intact. Strength 5/5 in all 4 limbs. Alert and oriented.  Psychiatric: Calm. Cooperative.    Labs and Imaging on Admission: I have personally reviewed following labs and imaging studies  CBC: Recent Labs  Lab 10/05/2021 2011  WBC 9.0  NEUTROABS 8.1*  HGB 14.4  HCT 42.7  MCV 94.5  PLT 939   Basic Metabolic Panel: Recent Labs  Lab 09/20/2021 2011  NA 135  K 3.5  CL 101  CO2 24  GLUCOSE 168*  BUN 10  CREATININE 0.46  CALCIUM 9.0   GFR: CrCl cannot be calculated (Unknown ideal weight.). Liver Function Tests: Recent Labs  Lab 09/24/2021 2011  AST 25  ALT 24  ALKPHOS 115  BILITOT 0.5  PROT 6.5  ALBUMIN 4.1   Recent Labs  Lab 09/24/2021 2011  LIPASE 21   No results for input(s): "AMMONIA" in the last 168 hours. Coagulation Profile: No results for input(s): "INR", "PROTIME" in the last  168 hours. Cardiac Enzymes: No results for input(s): "CKTOTAL", "CKMB", "CKMBINDEX", "TROPONINI" in the last 168 hours. BNP (last 3 results) No results for input(s): "PROBNP" in the last 8760 hours. HbA1C: No results for input(s): "HGBA1C" in the last 72 hours. CBG: No results for input(s): "GLUCAP" in the last 168 hours. Lipid Profile: No results for input(s): "CHOL", "HDL", "LDLCALC", "TRIG", "CHOLHDL", "LDLDIRECT" in the last 72 hours. Thyroid Function Tests: No results for input(s): "TSH", "T4TOTAL", "FREET4", "T3FREE", "THYROIDAB" in the last 72 hours. Anemia Panel: No results for input(s): "VITAMINB12", "FOLATE", "FERRITIN", "TIBC", "IRON", "RETICCTPCT" in the last 72 hours. Urine analysis:    Component Value Date/Time   COLORURINE YELLOW 10/03/2021 0151   APPEARANCEUR HAZY (A) 10/03/2021 0151   LABSPEC 1.015 10/03/2021 0151   PHURINE 7.0 10/03/2021 0151   GLUCOSEU NEGATIVE 10/03/2021 0151   HGBUR MODERATE (A) 10/03/2021 0151   BILIRUBINUR NEGATIVE 10/03/2021 0151   KETONESUR  5 (A) 10/03/2021 0151   PROTEINUR 30 (A) 10/03/2021 0151   UROBILINOGEN 0.2 04/08/2010 1105   NITRITE NEGATIVE 10/03/2021 0151   LEUKOCYTESUR NEGATIVE 10/03/2021 0151   Sepsis Labs: '@LABRCNTIP' (procalcitonin:4,lacticidven:4) ) Recent Results (from the past 240 hour(s))  Resp Panel by RT-PCR (Flu A&B, Covid) Anterior Nasal Swab     Status: None   Collection Time: 09/23/2021  8:01 PM   Specimen: Anterior Nasal Swab  Result Value Ref Range Status   SARS Coronavirus 2 by RT PCR NEGATIVE NEGATIVE Final    Comment: (NOTE) SARS-CoV-2 target nucleic acids are NOT DETECTED.  The SARS-CoV-2 RNA is generally detectable in upper respiratory specimens during the acute phase of infection. The lowest concentration of SARS-CoV-2 viral copies this assay can detect is 138 copies/mL. A negative result does not preclude SARS-Cov-2 infection and should not be used as the sole basis for treatment or other patient management decisions. A negative result may occur with  improper specimen collection/handling, submission of specimen other than nasopharyngeal swab, presence of viral mutation(s) within the areas targeted by this assay, and inadequate number of viral copies(<138 copies/mL). A negative result must be combined with clinical observations, patient history, and epidemiological information. The expected result is Negative.  Fact Sheet for Patients:  EntrepreneurPulse.com.au  Fact Sheet for Healthcare Providers:  IncredibleEmployment.be  This test is no t yet approved or cleared by the [place] FDA and  has been authorized for detection and/or diagnosis of SARS-CoV-2 by FDA under an Emergency Use Authorization (EUA). This EUA will remain  in effect (meaning this test can be used) for the duration of the COVID-19 declaration under Section 564(b)(1) of the Act, 21 U.S.C.section 360bbb-3(b)(1), unless the authorization is terminated  or revoked sooner.        Influenza A by PCR NEGATIVE NEGATIVE Final   Influenza B by PCR NEGATIVE NEGATIVE Final    Comment: (NOTE) The Xpert Xpress SARS-CoV-2/FLU/RSV plus assay is intended as an aid in the diagnosis of influenza from Nasopharyngeal swab specimens and should not be used as a sole basis for treatment. Nasal washings and aspirates are unacceptable for Xpert Xpress SARS-CoV-2/FLU/RSV testing.  Fact Sheet for Patients: EntrepreneurPulse.com.au  Fact Sheet for Healthcare Providers: IncredibleEmployment.be  This test is not yet approved or cleared by the [place] FDA and has been authorized for detection and/or diagnosis of SARS-CoV-2 by FDA under an Emergency Use Authorization (EUA). This EUA will remain in effect (meaning this test can be used) for the duration of the COVID-19 declaration under Section 564(b)(1) of the Act, 21 U.S.C.  section 360bbb-3(b)(1), unless the authorization is terminated or revoked.  Performed at [facility], [place] [address], [place], [place] [address]      Radiological Exams on Admission: MR BRAIN WO CONTRAST  Result Date: 10/03/2021 CLINICAL DATA:  Stroke follow-up EXAM: MRI HEAD WITHOUT CONTRAST TECHNIQUE: Multiplanar, multiecho pulse sequences of the brain and surrounding structures were obtained without intravenous contrast. COMPARISON:  None Available. FINDINGS: Brain: Multifocal acute/early subacute ischemia within the right cerebellum, predominantly located in the right PICA territory. No supratentorial acute ischemia. Chronic infarct and chronic hemorrhage in the left cerebellum. Old right PCA territory infarct. There is multifocal periventricular white matter hyperintensity, most often a result of chronic microvascular ischemia. The midline structures are normal. Vascular: Major flow voids are preserved. Skull and upper cervical spine: Normal calvarium and skull base. Visualized upper cervical spine and soft  tissues are normal. Sinuses/Orbits:No paranasal sinus fluid levels or advanced mucosal thickening. No mastoid or middle ear effusion. Normal orbits. IMPRESSION: 1. Multifocal acute/early subacute ischemia within the right cerebellum, predominantly located in the right PICA territory. No hemorrhage or mass effect. 2. 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 Electronically Signed   By: Ulyses Jarred M.D.   On: 10/03/2021 01:48   CT ANGIO HEAD NECK W WO CM  Result Date: 10/03/2021 CLINICAL DATA:  Dizziness EXAM: CT ANGIOGRAPHY HEAD AND NECK TECHNIQUE: Multidetector CT imaging of the head and neck was performed using the standard protocol during bolus administration of intravenous contrast. Multiplanar CT image reconstructions and MIPs were obtained to evaluate the vascular anatomy. Carotid stenosis measurements (when applicable) are obtained utilizing NASCET criteria, using the distal internal carotid diameter as the denominator. RADIATION DOSE REDUCTION: This exam was performed according to the departmental dose-optimization program which includes automated exposure control, adjustment of the mA and/or kV according to patient size and/or use of iterative reconstruction technique. CONTRAST:  69m OMNIPAQUE IOHEXOL 350 MG/ML SOLN COMPARISON:  10/09/2020 FINDINGS: CT HEAD FINDINGS Brain: There is an acute/early subacute infarct of the right PICA territory. There are old bilateral cerebellar infarcts and an old right PCA territory infarct. The size and configuration of the ventricles and extra-axial CSF spaces are normal. There is hypoattenuation of the periventricular white matter, most commonly indicating chronic ischemic microangiopathy. Skull: The visualized skull base, calvarium and extracranial soft tissues are normal. Sinuses/Orbits: No fluid levels or advanced mucosal thickening of the visualized paranasal sinuses. No mastoid or middle ear effusion. The orbits are  normal. CTA NECK FINDINGS SKELETON: There is no bony spinal canal stenosis. No lytic or blastic lesion. OTHER NECK: Normal pharynx, larynx and major salivary glands. No cervical lymphadenopathy. Unremarkable thyroid gland. UPPER CHEST: Biapical emphysema. AORTIC ARCH: There is calcific atherosclerosis of the aortic arch. There is no aneurysm, dissection or hemodynamically significant stenosis of the visualized portion of the aorta. Conventional 3 vessel aortic branching pattern. The visualized proximal subclavian arteries are widely patent. RIGHT CAROTID SYSTEM: No dissection, occlusion or aneurysm. Mild atherosclerotic calcification at the carotid bifurcation without hemodynamically significant stenosis. LEFT CAROTID SYSTEM: Normal without aneurysm, dissection or stenosis. VERTEBRAL ARTERIES: The right vertebral artery V3 segment is occluded. The right V4 segment is incompletely opacified. The left vertebral artery is non dominant and there is multifocal occlusion or severe stenosis along the V2 segment. CTA HEAD FINDINGS POSTERIOR CIRCULATION: --Vertebral arteries: Normal V4 segments. --Inferior cerebellar arteries: Right PICA not visualized. --Basilar artery: Normal. --Superior cerebellar arteries: Normal. --Posterior cerebral arteries (PCA): Mild stenosis of  the right P2 segment. Severe stenosis of the left P2 segment. ANTERIOR CIRCULATION: --Intracranial internal carotid arteries: Normal. --Anterior cerebral arteries (ACA): Mild stenosis of the right A1 segment. Otherwise normal. --Middle cerebral arteries (MCA): Normal. VENOUS SINUSES: As permitted by contrast timing, patent. ANATOMIC VARIANTS: None Review of the MIP images confirms the above findings. IMPRESSION: 1. 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 No hemorrhage or mass effect. 2. 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 3. Multifocal  occlusion or severe stenosis of the left vertebral artery V2 segment. 4. Severe stenosis of the left PCA P2 segment. 5. Aortic Atherosclerosis (ICD10-I70.0) and Emphysema (ICD10-J43.9). 6. Critical Value/emergent results were called by telephone at the time of interpretation on 10/03/2021 at 12:06 am to provider Wynona Dove , who verbally acknowledged these results. Electronically Signed   By: Ulyses Jarred M.D.   On: 10/03/2021 00:07    Assessment/Plan   1. Acute ischemic CVA  - Presents with vertigo and found to have right PICA infarction on CT  - Appreciate neurology recommendations  - Continue frequent neuro checks, cardiac monitoring, check echo, lipids, A1c, TSH, ESR, and RPR, permit HTN to 297/989, consult PT/OT/SLP, give ASA 325 PO or 300 mg PR then 81 mg daily    2. Hypertension   - Permissive HTN in acute-phase of ischemic CVA   3. COPD  - Not in exacerbation on admission  - Continue ICS-LAMA-LABA, continue to encourage smoking cessation   4. Hypothyroidism  - Continue Synthroid    5. Chronic pain - Prescription database reviewed  - Continue home regimen with reduced dosing per neuro recommendation     DVT prophylaxis: Lovenox  Code Status: Full  Level of Care: Level of care: Progressive Family Communication: Daughter at bedside  Disposition Plan:  Patient is from: home  Anticipated d/c is to: TBD Anticipated d/c date is: 10/04/21  Patient currently: Pending CVA workup  Consults called: Neurology  Admission status:  Observation   Vianne Bulls, MD [facility]  10/03/2021, 3:32 AM

## 2021-10-03 NOTE — ED Notes (Signed)
Provider at bedside

## 2021-10-03 NOTE — Progress Notes (Signed)
[facility] PROGRESS NOTE    Progress Note  [name]  [id number] DOB: March 16, 1945 DOA: 09/22/2021 PCP: Wenda Low, MD     Brief Narrative:   [name] is an 76 y.o. female past medical history significant for chronic pain, COPD hypothyroidism tobacco abuse comes into the emergency room complaining of dizziness nausea and vomiting.  He relates the room is spinning around him CTA of the head and neck Showed a acute/subacute infarct of the right PICA no hemorrhage or mass effect and multiple other occlusions of the left vertebral artery at the V2 segment, severe stenosis of the left PCA P2 segment.  MRI confirmed multifocal acute/subacute infarct in the right cerebellum  Assessment/Plan:   Acute ischemic stroke [facility]): HgbA1c, fasting lipid panel  HDL > 40, LDL > 120 MRI, acute CVA to the cerebellum. PT, OT, pending CTA of the head showed right PCA territory stroke and multiple severe other arteries occlusions. Transthoracic Echo, pending Start patient on ASA 81mg  daily and plavix 75mg  daily, was previously on no antiplatelet therapy High-dose statins. BP goal: permissive HTN upto 220/120 mmHg Telemetry monitoring. Appreciate neurology's assistance.  Essential hypertension: Allow permissive hypertension in the setting of acute CVA.  Goal less than [id number] systolic.  COPD with emphysema ([place]) Stable continue inhalers.  Hypothyroidism: Continue Synthroid.  Chronic pain syndrome Continue current home regimen.  DVT prophylaxis: lovenox Family Communication:daughter Status is: Observation The patient will require care spanning > 2 midnights and should be moved to inpatient because: Acute CVA    Code Status:     Code Status Orders  (From admission, onward)           Start     Ordered   10/03/21 0330  Full code  Continuous        10/03/21 0331           Code Status History     Date Active Date Inactive Code Status Order ID Comments User  Context   09/25/2014 1716 10/03/2014 1457 Full Code [id number]  Excell Seltzer, MD Inpatient         IV Access:   Peripheral IV   Procedures and diagnostic studies:   MR BRAIN WO CONTRAST  Result Date: 10/03/2021 CLINICAL DATA:  Stroke follow-up EXAM: MRI HEAD WITHOUT CONTRAST TECHNIQUE: Multiplanar, multiecho pulse sequences of the brain and surrounding structures were obtained without intravenous contrast. COMPARISON:  None Available. FINDINGS: Brain: Multifocal acute/early subacute ischemia within the right cerebellum, predominantly located in the right PICA territory. No supratentorial acute ischemia. Chronic infarct and chronic hemorrhage in the left cerebellum. Old right PCA territory infarct. There is multifocal periventricular white matter hyperintensity, most often a result of chronic microvascular ischemia. The midline structures are normal. Vascular: Major flow voids are preserved. Skull and upper cervical spine: Normal calvarium and skull base. Visualized upper cervical spine and soft tissues are normal. Sinuses/Orbits:No paranasal sinus fluid levels or advanced mucosal thickening. No mastoid or middle ear effusion. Normal orbits. IMPRESSION: 1. Multifocal acute/early subacute ischemia within the right cerebellum, predominantly located in the right PICA territory. No hemorrhage or mass effect. 2. 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 Electronically Signed   By: Ulyses Jarred M.D.   On: 10/03/2021 01:48   CT ANGIO HEAD NECK W WO CM  Result Date: 10/03/2021 CLINICAL DATA:  Dizziness EXAM: CT ANGIOGRAPHY HEAD AND NECK TECHNIQUE: Multidetector CT imaging of the head and neck was performed using the standard protocol during bolus  administration of intravenous contrast. Multiplanar CT image reconstructions and MIPs were obtained to evaluate the vascular anatomy. Carotid stenosis measurements (when applicable) are obtained utilizing NASCET  criteria, using the distal internal carotid diameter as the denominator. RADIATION DOSE REDUCTION: This exam was performed according to the departmental dose-optimization program which includes automated exposure control, adjustment of the mA and/or kV according to patient size and/or use of iterative reconstruction technique. CONTRAST:  33mL OMNIPAQUE IOHEXOL 350 MG/ML SOLN COMPARISON:  10/09/2020 FINDINGS: CT HEAD FINDINGS Brain: There is an acute/early subacute infarct of the right PICA territory. There are old bilateral cerebellar infarcts and an old right PCA territory infarct. The size and configuration of the ventricles and extra-axial CSF spaces are normal. There is hypoattenuation of the periventricular white matter, most commonly indicating chronic ischemic microangiopathy. Skull: The visualized skull base, calvarium and extracranial soft tissues are normal. Sinuses/Orbits: No fluid levels or advanced mucosal thickening of the visualized paranasal sinuses. No mastoid or middle ear effusion. The orbits are normal. CTA NECK FINDINGS SKELETON: There is no bony spinal canal stenosis. No lytic or blastic lesion. OTHER NECK: Normal pharynx, larynx and major salivary glands. No cervical lymphadenopathy. Unremarkable thyroid gland. UPPER CHEST: Biapical emphysema. AORTIC ARCH: There is calcific atherosclerosis of the aortic arch. There is no aneurysm, dissection or hemodynamically significant stenosis of the visualized portion of the aorta. Conventional 3 vessel aortic branching pattern. The visualized proximal subclavian arteries are widely patent. RIGHT CAROTID SYSTEM: No dissection, occlusion or aneurysm. Mild atherosclerotic calcification at the carotid bifurcation without hemodynamically significant stenosis. LEFT CAROTID SYSTEM: Normal without aneurysm, dissection or stenosis. VERTEBRAL ARTERIES: The right vertebral artery V3 segment is occluded. The right V4 segment is incompletely opacified. The left  vertebral artery is non dominant and there is multifocal occlusion or severe stenosis along the V2 segment. CTA HEAD FINDINGS POSTERIOR CIRCULATION: --Vertebral arteries: Normal V4 segments. --Inferior cerebellar arteries: Right PICA not visualized. --Basilar artery: Normal. --Superior cerebellar arteries: Normal. --Posterior cerebral arteries (PCA): Mild stenosis of the right P2 segment. Severe stenosis of the left P2 segment. ANTERIOR CIRCULATION: --Intracranial internal carotid arteries: Normal. --Anterior cerebral arteries (ACA): Mild stenosis of the right A1 segment. Otherwise normal. --Middle cerebral arteries (MCA): Normal. VENOUS SINUSES: As permitted by contrast timing, patent. ANATOMIC VARIANTS: None Review of the MIP images confirms the above findings. IMPRESSION: 1. 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 No hemorrhage or mass effect. 2. 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 3. 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 4. Severe stenosis of the left PCA P2 segment. 5. Aortic Atherosclerosis (ICD10-I70.0) and Emphysema (ICD10-J43.9). 6. Critical Value/emergent results were called by telephone at the time of interpretation on 10/03/2021 at 12:06 am to provider Wynona Dove , who verbally acknowledged these results. Electronically Signed   By: Ulyses Jarred M.D.   On: 10/03/2021 00:07     Medical Consultants:   None.   Subjective:    [name] still complaining of dizziness.  Objective:    Vitals:   10/03/21 0330 10/03/21 0400 10/03/21 0545 10/03/21 0545  BP: (!) 195/85 (!) 181/78 (!) 168/84   Pulse: 69 73 75   Resp: 20 17 14    Temp:    98.2 F (36.8 C)  TempSrc:    Oral  SpO2: 96% 97% 98%    SpO2: 98 %  No intake or output data in  the 24 hours ending 10/03/21 0701 There were no vitals filed for this  visit.  Exam: General exam: In no acute distress. Respiratory system: Good air movement and clear to auscultation. Cardiovascular system: S1 & S2 heard, RRR. No JVD. Gastrointestinal system: Abdomen is nondistended, soft and nontender.  Extremities: No pedal edema. Skin: No rashes, lesions or ulcers Psychiatry: Judgement and insight appear normal. Mood & affect appropriate.    Data Reviewed:    Labs: Basic Metabolic Panel: Recent Labs  Lab 30-Oct-2021 2011 10/03/21 0358  NA 135 134*  K 3.5 3.3*  CL 101 99  CO2 24 23  GLUCOSE 168* 132*  BUN 10 11  CREATININE 0.46 0.47  CALCIUM 9.0 8.7*   GFR CrCl cannot be calculated (Unknown ideal weight.). Liver Function Tests: Recent Labs  Lab October 30, 2021 2011 10/03/21 0358  AST 25 24  ALT 24 25  ALKPHOS 115 126  BILITOT 0.5 0.6  PROT 6.5 6.4*  ALBUMIN 4.1 4.0   Recent Labs  Lab October 30, 2021 2011  LIPASE 21   No results for input(s): "AMMONIA" in the last 168 hours. Coagulation profile No results for input(s): "INR", "PROTIME" in the last 168 hours. COVID-19 Labs  No results for input(s): "DDIMER", "FERRITIN", "LDH", "CRP" in the last 72 hours.  Lab Results  Component Value Date   SARSCOV2NAA NEGATIVE Oct 30, 2021    CBC: Recent Labs  Lab 2021-10-30 2011 10/03/21 0358  WBC 9.0 8.2  NEUTROABS 8.1*  --   HGB 14.4 14.4  HCT 42.7 42.0  MCV 94.5 93.8  PLT 188 180   Cardiac Enzymes: No results for input(s): "CKTOTAL", "CKMB", "CKMBINDEX", "TROPONINI" in the last 168 hours. BNP (last 3 results) No results for input(s): "PROBNP" in the last 8760 hours. CBG: No results for input(s): "GLUCAP" in the last 168 hours. D-Dimer: No results for input(s): "DDIMER" in the last 72 hours. Hgb A1c: No results for input(s): "HGBA1C" in the last 72 hours. Lipid Profile: Recent Labs    10/03/21 0358  CHOL 231*  HDL 93  LDLCALC 129*  TRIG 46  CHOLHDL 2.5   Thyroid function studies: Recent Labs    10/03/21 0358  TSH <0.010*    Anemia work up: Recent Labs    10/03/21 0358  VITAMINB12 274   Sepsis Labs: Recent Labs  Lab Oct 30, 2021 2011 10/03/21 0358  WBC 9.0 8.2   Microbiology Recent Results (from the past 240 hour(s))  Resp Panel by RT-PCR (Flu A&B, Covid) Anterior Nasal Swab     Status: None   Collection Time: 10-30-21  8:01 PM   Specimen: Anterior Nasal Swab  Result Value Ref Range Status   SARS Coronavirus 2 by RT PCR NEGATIVE NEGATIVE Final    Comment: (NOTE) SARS-CoV-2 target nucleic acids are NOT DETECTED.  The SARS-CoV-2 RNA is generally detectable in upper respiratory specimens during the acute phase of infection. The lowest concentration of SARS-CoV-2 viral copies this assay can detect is 138 copies/mL. A negative result does not preclude SARS-Cov-2 infection and should not be used as the sole basis for treatment or other patient management decisions. A negative result may occur with  improper specimen collection/handling, submission of specimen other than nasopharyngeal swab, presence of viral mutation(s) within the areas targeted by this assay, and inadequate number of viral copies(<138 copies/mL). A negative result must be combined with clinical observations, patient history, and epidemiological information. The expected result is Negative.  Fact Sheet for Patients:  BloggerCourse.com  Fact Sheet for Healthcare Providers:  SeriousBroker.it  This test is no t yet approved or cleared by the [place] and  has been authorized for detection and/or diagnosis of SARS-CoV-2 by FDA under an Emergency Use Authorization (EUA). This EUA will remain  in effect (meaning this test can be used) for the duration of the COVID-19 declaration under Section 564(b)(1) of the Act, 21 U.S.C.section 360bbb-3(b)(1), unless the authorization is terminated  or revoked sooner.       Influenza A by PCR NEGATIVE NEGATIVE Final   Influenza B by PCR  NEGATIVE NEGATIVE Final    Comment: (NOTE) The Xpert Xpress SARS-CoV-2/FLU/RSV plus assay is intended as an aid in the diagnosis of influenza from Nasopharyngeal swab specimens and should not be used as a sole basis for treatment. Nasal washings and aspirates are unacceptable for Xpert Xpress SARS-CoV-2/FLU/RSV testing.  Fact Sheet for Patients: BloggerCourse.com  Fact Sheet for Healthcare Providers: SeriousBroker.it  This test is not yet approved or cleared by the [place] FDA and has been authorized for detection and/or diagnosis of SARS-CoV-2 by FDA under an Emergency Use Authorization (EUA). This EUA will remain in effect (meaning this test can be used) for the duration of the COVID-19 declaration under Section 564(b)(1) of the Act, 21 U.S.C. section 360bbb-3(b)(1), unless the authorization is terminated or revoked.  Performed at [facility], [address] [place], [place] [address]      Medications:    [START ON 10/04/2021]  stroke: early stages of recovery book   Does not apply Once   aspirin EC  81 mg Oral Daily   aspirin  300 mg Rectal Daily   Or   aspirin  325 mg Oral Daily   enoxaparin (LOVENOX) injection  30 mg Subcutaneous Daily   fluticasone furoate-vilanterol  1 puff Inhalation Daily   And   umeclidinium bromide  1 puff Inhalation Daily   levothyroxine  100 mcg Oral Daily   Continuous Infusions:    LOS: 0 days   Marinda Elk  [facility]  10/03/2021, 7:01 AM

## 2021-10-03 NOTE — Consult Note (Addendum)
Neurology Consultation Reason for Consult: Dizziness, stroke on head CT Requesting Physician: Addison Lank  CC: Dizziness  History is obtained from: Patient, daughter at bedside and chart review  HPI: [name] is a 76 y.o. female with a past medical history significant for hypertension, hyperlipidemia, hypothyroidism, ongoing tobacco abuse (perhaps 1 pack/day), COPD, prior gastrectomy (2016), degenerative disc disease of the C-spine with chronic C1 fracture, chronic pain on chronic opiates  At some point last week she had some dizziness which she attributed to her blood pressure medications and therefore discontinued them.  She defers most of the history to her daughter as she is tired and does not feel like speaking.  Her daughter notes that she moved in with her mom due to being in between jobs approximately 3 weeks ago but the patient is entirely functionally independent and managing all of her ADLs and IADLs including driving.  The patient does just suffer with chronic pain and takes up to 3 hydrocodone daily as well as 5 tramadol daily (she is not sure of the doses).  She has also been complaining of some shortness of breath for about the past week which has been intermittent and improving with albuterol.  Additionally she was prescribed a Z-Pak for her sinuses and finished the last dose this morning.  When she was eating around noon and she acutely started to feel dizzy and went to bed.  Due to unrelenting symptoms and difficulty walking she called EMS to bring her to the ED for further evaluation.  Regarding her smoking, daughter reports that the patient typically likes the cigarettes and takes a few puffs with does not fully smoke cigarettes she is smoking and improvement from how much she used to smoke in the past  CTA demonstrated right PICA stroke for which neurology was consulted   LKW: Approximately noon on 8/24 tPA given?: No, out of the window and hypodensity on head CT IA  performed?: No, window, completed stroke Premorbid modified rankin scale:      1 - No significant disability. Able to carry out all usual activities, despite some symptoms.  ROS: Somewhat limited by patient participation but pertinent details above  Past Medical History:  Diagnosis Date   Acute bronchitis    Anemia    Arthritis    Chronic rhinitis    COPD (chronic obstructive pulmonary disease) ([place])    GERD (gastroesophageal reflux disease)    Headache    Hyperthyroidism 2012   thyroidectomy   Hypothyroidism    Thyroid disease    Tobacco use disorder    Past Surgical History:  Procedure Laterality Date   ESOPHAGOGASTRODUODENOSCOPY  10/22/2011   Procedure: ESOPHAGOGASTRODUODENOSCOPY (EGD);  Surgeon: Arta Silence, MD;  Location: [facility] ENDOSCOPY;  Service: Endoscopy;  Laterality: Left;   ESOPHAGOGASTRODUODENOSCOPY N/A 07/24/2014   Procedure: ESOPHAGOGASTRODUODENOSCOPY (EGD);  Surgeon: Garlan Fair, MD;  Location: [facility] ENDOSCOPY;  Service: Endoscopy;  Laterality: N/A;   GASTRIC OUTLET OBSTRUCTION RELEASE     7 years ago   PARTIAL GASTRECTOMY N/A 09/25/2014   Procedure: subtotal gastrectomy resection gastric jejunal anastamosis feeding jejunostomy roux -en-y reconstruction;  Surgeon: Excell Seltzer, MD;  Location: [facility] ORS;  Service: General;  Laterality: N/A;   THYROIDECTOMY  2012   TOTAL ABDOMINAL HYSTERECTOMY     Current Outpatient Medications  Medication Instructions   Aspirin-Caffeine (BC FAST PAIN RELIEF PO) 1 packet, Oral, Daily   Budeson-Glycopyrrol-Formoterol (BREZTRI AEROSPHERE) 160-9-4.8 MCG/ACT AERO 2 puffs, Inhalation, 2 times daily   Butalbital-APAP-Caff-Cod 50-300-40-30 MG CAPS  1 every 4 hours only if needed for pain   esomeprazole (NEXIUM) 40 mg, Oral, Daily   Ferrous Sulfate (SLOW FE PO) 1 tablet, Oral, 2 times daily   Fluticasone-Umeclidin-Vilant (TRELEGY ELLIPTA) 100-62.5-25 MCG/INH AEPB 1 puff, Inhalation, Daily   Fluticasone-Umeclidin-Vilant (TRELEGY ELLIPTA)  100-62.5-25 MCG/INH AEPB 1 puff, Inhalation, Daily   HYDROcodone-acetaminophen (NORCO/VICODIN) 5-325 MG tablet 1-2 tablets, Oral, Every 4 hours PRN   ibuprofen (ADVIL) 200 mg, Oral, Every 6 hours PRN   levothyroxine (SYNTHROID, LEVOTHROID) 125 MCG tablet 1 tablet, Oral, Daily   Multiple Vitamin (MULTIVITAMIN) capsule 1 capsule, Oral, Every morning   traMADol (ULTRAM) 50 mg, Oral, Every 6 hours PRN, Osteoarthritis pain<BR>Takes with Vicodin    Family History  Problem Relation Age of Onset   Cancer Father    Cirrhosis Mother    Breast cancer Neg Hx     Social History:  reports that she has been smoking cigarettes. She has a 20.00 pack-year smoking history. She has never used smokeless tobacco. She reports current alcohol use. She reports that she does not use drugs.   Exam: Current vital signs: BP (!) 185/74   Pulse (!) 45   Temp (!) 97.5 F (36.4 C) (Oral)   Resp 15   SpO2 97%  Vital signs in last 24 hours: Temp:  [97.5 F (36.4 C)] 97.5 F (36.4 C) (08/24 1901) Pulse Rate:  [45-80] 45 (08/24 2300) Resp:  [14-19] 15 (08/24 2300) BP: (140-217)/(74-107) 185/74 (08/24 2300) SpO2:  [95 %-98 %] 97 % (08/24 2300)   Physical Exam  Constitutional: Appears emaciated, uncomfortable and restless in the bed Psych: Affect irritable, restless but cooperates with much of the exam Eyes: No scleral injection HENT: No oropharyngeal obstruction.  Edentulous MSK: Arthritic changes of the hands Cardiovascular: Normal rate and regular rhythm. Perfusing extremities well Respiratory: Effort normal, non-labored breathing GI: Soft.  No distension. There is no tenderness.  Skin: Warm dry and intact visible skin  Neuro: Mental Status: Patient is awake initially but falls asleep easily during the examination, oriented to person, place, month, and situation. Patient gives limited history due to irritability She does seem to have some word finding difficulties on examination which daughter  attributes to fatigue Cranial Nerves: II: Visual Fields are full. Pupils are equal, round, and reactive to light.   III,IV, VI: EOMI without ptosis or diploplia.  There is direction changing nystagmus V: Facial sensation is symmetric to light touch VII: Facial movement is notable for right facial droop baseline per daughter VIII: hearing is intact to voice X: Uvula elevates symmetrically XI: Shoulder shrug is symmetric. XII: tongue is midline without atrophy or fasciculations.  Motor: Tone is normal. Bulk is normal. 5/5 strength was present in all four extremities.  Sensory: Sensation is symmetric to light touch and temperature in the arms and legs. Deep Tendon Reflexes: Deferred by patient Cerebellar: Finger-to-nose testing notable for right upper extremity ataxia, lower extremity testing deferred by patient  Gait:  Deferred by patient  NIHSS total 4 Score breakdown: One-point for drowsiness, one-point for right facial droop, one-point for right upper extremity ataxia, one-point for mild aphasia Performed at 2 AM   I have reviewed labs in epic and the results pertinent to this consultation are:  Basic Metabolic Panel: Recent Labs  Lab 09/17/2021 2011  NA 135  K 3.5  CL 101  CO2 24  GLUCOSE 168*  BUN 10  CREATININE 0.46  CALCIUM 9.0    CBC: Recent Labs  Lab 10/03/2021 2011  WBC 9.0  NEUTROABS 8.1*  HGB 14.4  HCT 42.7  MCV 94.5  PLT 188    Coagulation Studies: No results for input(s): "LABPROT", "INR" in the last 72 hours.    I have reviewed the images obtained:  CTA personally reviewed, agree with radiology:   1. 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 No hemorrhage or mass effect. 2. 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 3. 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 4. Severe stenosis of the left  PCA P2 segment. 5. Aortic Atherosclerosis (ICD10-I70.0) and Emphysema (ICD10-J43.9). 6. Critical Value/emergent results were called by telephone at the time of interpretation on 10/03/2021 at 12:06 am to provider Wynona Dove , who verbally acknowledged these results.  MRI brain personally reviewed, agree with radiology: 1. Multifocal acute/early subacute ischemia within the right cerebellum, predominantly located in the right PICA territory. No hemorrhage or mass effect. 2. 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 On my review I also am concerned for some possible subacute strokes in the left cerebellum versus T2 shine through  Impression: Right PICA stroke secondary to right vertebral artery atherosclerotic disease as well as some concern for left cerebellar subacute strokes and chronic right PCA stroke.  Recommendations: # R PICA stroke with additional subacute / chronic strokes, most likely atheroembolic although cannot rule out cardioembolic - Stroke labs TSH, ESR, RPR, HgbA1c, fasting lipid panel - Frequent neuro checks q2 hours for now, notepeak swelling window will end 8/29 - Echocardiogram - Prophylactic therapy-Antiplatelet med: Aspirin - dose 342m PO or 3061mPR, followed by 81 mg daily - Hold off on Plavix due to risk of symptomatic edema requiring potential intervention - Risk factor modification, smoking cessation discussed with patient and brochure for quit line provided - Telemetry monitoring - Blood pressure goal   - Permissive hypertension to 220/120  - PT consult, OT consult, Speech consult, unless patient is back to baseline - Stroke team to follow  # History of gastric sleeve # Malnutrition - b12, mma, thiamine  - Consider dietitian consult  # Chronic pain - Discussed with patient that avoidance of sedating medications to ensure we can monitor her neurological examination will be important.  Recommend starting at  half of her home dose of medications. - Consider palliative care consult; patient seems focused on pain control primarily but additionally reports she is a full COElrodD-PhD [facility] [phone] 7 PM to 7 AM, outside of these hours please call Neurologist on call as listed on Amion.

## 2021-10-03 NOTE — Progress Notes (Addendum)
STROKE TEAM PROGRESS NOTE   INTERVAL HISTORY Afebrile. BP 150-200s. BP 220 in room during interview. K 3.3 Cr 0.47. LDL 129. TSH <0.01.   Her son and daughter are at the bedside.  Patient reports she has been feeling dizzy for the past 2 weeks, per daughter she discontinued her BP meds last week. 12 hours prior to admission, she acutely felt dizzy, went to bed, and with unrelenting symptoms, came to ED. Patient alert, oriented on interview. Oriented to situation. Denies current dizziness. Discussed smoking cessation and aggressive risk factor control.   CTA with acute/early subacute infarct of R PICA. Occlusion of right vertebral V3 and incomplete opacification of right V4 and right PICA origin. Stenosis of left vertebral artery V2 and left PCA P2. MRI with multifocal acute/early subacute ischemia within R cerebellum, in right PICA. Old right PCA infarct.   Vitals:   10/03/21 1230 10/03/21 1246 10/03/21 1255 10/03/21 1400  BP: (!) 190/75 (!) 171/82 (!) 165/139 (!) 169/78  Pulse: 72 86 76 66  Resp: 20 (!) 26 (!) 27 16  Temp:  98.5 F (36.9 C)    TempSrc:  Oral    SpO2: 98% 98% 97% 96%   CBC:  Recent Labs  Lab 09/16/2021 2011 10/03/21 0358  WBC 9.0 8.2  NEUTROABS 8.1*  --   HGB 14.4 14.4  HCT 42.7 42.0  MCV 94.5 93.8  PLT 188 [id number]   Basic Metabolic Panel:  Recent Labs  Lab 09/21/2021 2011 10/03/21 0358  NA 135 134*  K 3.5 3.3*  CL 101 99  CO2 24 23  GLUCOSE 168* 132*  BUN 10 11  CREATININE 0.46 0.47  CALCIUM 9.0 8.7*   Lipid Panel:  Recent Labs  Lab 10/03/21 0358  CHOL 231*  TRIG 46  HDL 93  CHOLHDL 2.5  VLDL 9  LDLCALC 129*   HgbA1c: No results for input(s): "HGBA1C" in the last 168 hours. Urine Drug Screen:  Recent Labs  Lab 10/03/21 0151  LABOPIA POSITIVE*  COCAINSCRNUR NONE DETECTED  LABBENZ NONE DETECTED  AMPHETMU NONE DETECTED  THCU NONE DETECTED  LABBARB POSITIVE*    Alcohol Level No results for input(s): "ETH" in the last 168 hours.  IMAGING past  24 hours MR BRAIN WO CONTRAST  Result Date: 10/03/2021 CLINICAL DATA:  Stroke follow-up EXAM: MRI HEAD WITHOUT CONTRAST TECHNIQUE: Multiplanar, multiecho pulse sequences of the brain and surrounding structures were obtained without intravenous contrast. COMPARISON:  None Available. FINDINGS: Brain: Multifocal acute/early subacute ischemia within the right cerebellum, predominantly located in the right PICA territory. No supratentorial acute ischemia. Chronic infarct and chronic hemorrhage in the left cerebellum. Old right PCA territory infarct. There is multifocal periventricular white matter hyperintensity, most often a result of chronic microvascular ischemia. The midline structures are normal. Vascular: Major flow voids are preserved. Skull and upper cervical spine: Normal calvarium and skull base. Visualized upper cervical spine and soft tissues are normal. Sinuses/Orbits:No paranasal sinus fluid levels or advanced mucosal thickening. No mastoid or middle ear effusion. Normal orbits. IMPRESSION: 1. Multifocal acute/early subacute ischemia within the right cerebellum, predominantly located in the right PICA territory. No hemorrhage or mass effect. 2. 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 Electronically Signed   By: Ulyses Jarred M.D.   On: 10/03/2021 01:48   CT ANGIO HEAD NECK W WO CM  Result Date: 10/03/2021 CLINICAL DATA:  Dizziness EXAM: CT ANGIOGRAPHY HEAD AND NECK TECHNIQUE: Multidetector CT imaging of the head and neck was  performed using the standard protocol during bolus administration of intravenous contrast. Multiplanar CT image reconstructions and MIPs were obtained to evaluate the vascular anatomy. Carotid stenosis measurements (when applicable) are obtained utilizing NASCET criteria, using the distal internal carotid diameter as the denominator. RADIATION DOSE REDUCTION: This exam was performed according to the departmental dose-optimization  program which includes automated exposure control, adjustment of the mA and/or kV according to patient size and/or use of iterative reconstruction technique. CONTRAST:  66mL OMNIPAQUE IOHEXOL 350 MG/ML SOLN COMPARISON:  10/09/2020 FINDINGS: CT HEAD FINDINGS Brain: There is an acute/early subacute infarct of the right PICA territory. There are old bilateral cerebellar infarcts and an old right PCA territory infarct. The size and configuration of the ventricles and extra-axial CSF spaces are normal. There is hypoattenuation of the periventricular white matter, most commonly indicating chronic ischemic microangiopathy. Skull: The visualized skull base, calvarium and extracranial soft tissues are normal. Sinuses/Orbits: No fluid levels or advanced mucosal thickening of the visualized paranasal sinuses. No mastoid or middle ear effusion. The orbits are normal. CTA NECK FINDINGS SKELETON: There is no bony spinal canal stenosis. No lytic or blastic lesion. OTHER NECK: Normal pharynx, larynx and major salivary glands. No cervical lymphadenopathy. Unremarkable thyroid gland. UPPER CHEST: Biapical emphysema. AORTIC ARCH: There is calcific atherosclerosis of the aortic arch. There is no aneurysm, dissection or hemodynamically significant stenosis of the visualized portion of the aorta. Conventional 3 vessel aortic branching pattern. The visualized proximal subclavian arteries are widely patent. RIGHT CAROTID SYSTEM: No dissection, occlusion or aneurysm. Mild atherosclerotic calcification at the carotid bifurcation without hemodynamically significant stenosis. LEFT CAROTID SYSTEM: Normal without aneurysm, dissection or stenosis. VERTEBRAL ARTERIES: The right vertebral artery V3 segment is occluded. The right V4 segment is incompletely opacified. The left vertebral artery is non dominant and there is multifocal occlusion or severe stenosis along the V2 segment. CTA HEAD FINDINGS POSTERIOR CIRCULATION: --Vertebral arteries:  Normal V4 segments. --Inferior cerebellar arteries: Right PICA not visualized. --Basilar artery: Normal. --Superior cerebellar arteries: Normal. --Posterior cerebral arteries (PCA): Mild stenosis of the right P2 segment. Severe stenosis of the left P2 segment. ANTERIOR CIRCULATION: --Intracranial internal carotid arteries: Normal. --Anterior cerebral arteries (ACA): Mild stenosis of the right A1 segment. Otherwise normal. --Middle cerebral arteries (MCA): Normal. VENOUS SINUSES: As permitted by contrast timing, patent. ANATOMIC VARIANTS: None Review of the MIP images confirms the above findings. IMPRESSION: 1. 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 No hemorrhage or mass effect. 2. 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 3. 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 4. Severe stenosis of the left PCA P2 segment. 5. Aortic Atherosclerosis (ICD10-I70.0) and Emphysema (ICD10-J43.9). 6. Critical Value/emergent results were called by telephone at the time of interpretation on 10/03/2021 at 12:06 am to provider Tanda Rockers , who verbally acknowledged these results. Electronically Signed   By: Deatra Robinson M.D.   On: 10/03/2021 00:07    PHYSICAL EXAM  Physical Exam  Constitutional: Appears well-developed and well-nourished.   Cardiovascular: Normal rate and regular rhythm.  Respiratory: Effort normal, non-labored breathing  Neuro: Mental Status: Patient is awake, alert, oriented to person, place, month, year, and situation. Patient is able to give a clear and coherent history. No signs of aphasia or neglect Cranial Nerves: II: Visual Fields are full.  III,IV, VI: EOMI without ptosis or diploplia.  V: Facial sensation is symmetric to light touch VII:  Facial movement is symmetric resting and smiling VIII: Hearing is intact to voice XI: Shoulder shrug is  symmetric. XII: Tongue protrudes midline without atrophy or fasciculations.  Motor: Tone is normal. Bulk is normal. 5/5 strength was present in all four extremities.  Sensory: Sensation is symmetric to light touch in the arms and legs.  Cerebellar: FNF with mild dysmetria on R. HKS with mild ataxia on R.    ASSESSMENT/PLAN Ms. DRISHTI SYBESMA is a 76 y.o. female with history of hypertension, hyperlipidemia, hypothyroidism, ongoing tobacco abuse (perhaps 1 pack/day), COPD, prior gastrectomy (2016), degenerative disc disease of the C-spine with chronic C1 fracture, chronic pain on chronic opiates who presented to ED with persistent dizziness. CTA demonstrated right PICA stroke for which neurology was consulted.   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 CTA head & neck Occlusion of the right vertebral artery V3 segment with incomplete opacification of the right V4 segment and right PICA origin occlusion. 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 Severe stenosis of the left PCA P2 segment. MRI  Multifocal acute/early subacute ischemia within the right cerebellum, predominantly located in the right PICA territory LDL 129 HgbA1c pending 2D echo EF 65 to 70% VTE prophylaxis - Lovenox No antithrombotic prior to admission, now on aspirin 325 mg daily and clopidogrel 75 mg daily for 3 months then ASA monotherapy given intracranial large vessel disease. Recommend CT head if neuro changes AND at time of discharge due to increased risk of reactive cerebral edema with cerebellar infarct  Therapy recommendations: CIR Disposition:  pending  Hyperthyroidism TSH < 0.01 Will repeat TSH, check Free T4 and free T3.  Likely the cause of her severe HTN  Hypertensive urgency Home meds:  amlodipine 5mg ,  restarted today  Unstable Gradually normalize in 3-5 days On amlodipine 5->10mg  and hydralazine 10->25 Q8h Long-term BP goal normotensive  Hyperlipidemia Home meds:  none LDL 129, goal < 70 Add crestor 40mg   Continue statin at discharge  Tobacco abuse Current smoker Smoking cessation counseling provided Pt is willing to quit  Other Stroke Risk Factors Advanced Age >/= 42   Other Active Problems COPD mixed type  Followed by Dr. Annamaria Boots  Continued on ICS-LAMA-LABA Chronic pain  Hospital day # 0  ATTENDING NOTE: I reviewed above note and agree with the assessment and plan. Pt was seen and examined.   76 year old female with history of hypertension, hyperlipidemia, smoker, COPD, chronic neck pain admitted for dizziness, lethargy, difficulty walking.  CT showed right subacute PICA infarct, chronic bilateral small PICA infarct.  CTA head and neck showed right V3 occlusion with V4 reconstitution but stenosis, right PICA occlusion, left V2 occlusion with reconstitution, left P2 stenosis.  MRI showed large right PICA infarct in the bilateral small old PICA infarct.  EF 65 to 70%, LDL 129, A1c pending, creatinine 0.47.  Blood pressure significantly elevated.  TSH <0.01.  On exam, patient neurologically intact no focal deficit except left upper and lower extremity mild dysmetria.  Etiology for patient stroke likely due to large vessel disease of vertebral artery stenosis/occlusion.  Recommend aggressive risk factor modification, including quit smoking.  Put on aspirin 325 and Plavix 75 DAPT for 3 months and then aspirin alone given severe intracranial stenosis.  Also added Crestor 40.  Close neuro monitoring for signs of hydrocephalus.  PT/OT recommend CIR.  Patient TSH was low, concerning for hyperthyroidism which may explain her hypertensive urgency.  Will check free T4 and free T3 and repeat TSH.  Gradually normalize BP in 3 to 5 days, increase amlodipine to 10 and hydralazine 25 every 8  hours.  We will follow  For detailed assessment and plan, please refer to above/below as I have made changes wherever appropriate.   Marvel Plan, MD PhD Stroke Neurology 10/03/2021 6:56 PM    To contact Stroke Continuity provider, please refer to [email or web address]. After hours, contact General Neurology

## 2021-10-03 NOTE — ED Notes (Signed)
Linen changed and peri-care provided

## 2021-10-03 NOTE — Evaluation (Signed)
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   Flatulence, eructation and gas pain 07/05/2020   Gastro-esophageal reflux disease without esophagitis 07/05/2020   Iron deficiency anemia 07/05/2020   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/2020   Migraine 07/05/2020   Mixed hyperlipidemia 07/05/2020   Osteoporosis 07/05/2020   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   Anemia of chronic disease 07/05/2020   Allergic rhinitis 07/05/2020   Chronic obstructive pulmonary disease (HCC) 07/05/2020   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   Peptic ulcer disease 07/05/2020   Osteoarthritis of ankle and foot, 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   Low back pain 04/15/2017   Protein-calorie malnutrition, severe (HCC) 09/29/2014   Gastric outlet obstruction 09/25/2014   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   Upper GI bleed 10/21/2011   Anemia 10/21/2011   SINUSITIS, ACUTE 02/17/2007   TOBACCO ABUSE 11/19/2006   ASTHMATIC BRONCHITIS, ACUTE 11/19/2006   RHINITIS, CHRONIC 11/19/2006   Past Medical History:  Past Medical History:  Diagnosis Date   Acute bronchitis    Anemia    Arthritis    Chronic rhinitis    COPD (chronic obstructive pulmonary disease) (HCC)    GERD (gastroesophageal reflux disease)    Headache    Hyperthyroidism 2012   thyroidectomy   Hypothyroidism    Thyroid disease    Tobacco use disorder    Past Surgical History:  Past Surgical History:  Procedure Laterality  Date   ESOPHAGOGASTRODUODENOSCOPY  10/22/2011   Procedure: ESOPHAGOGASTRODUODENOSCOPY (EGD);  Surgeon: Willis Modena, MD;  Location: [facility] ENDOSCOPY;  Service: Endoscopy;  Laterality: Left;   ESOPHAGOGASTRODUODENOSCOPY N/A 07/24/2014   Procedure: ESOPHAGOGASTRODUODENOSCOPY (EGD);  Surgeon: Charolett Bumpers, MD;  Location: [facility] ENDOSCOPY;  Service: Endoscopy;  Laterality: N/A;   GASTRIC OUTLET OBSTRUCTION RELEASE     7 years ago   PARTIAL GASTRECTOMY N/A 09/25/2014   Procedure: subtotal gastrectomy resection gastric jejunal anastamosis feeding jejunostomy roux -en-y reconstruction;  Surgeon: Glenna Fellows, MD;  Location: [facility] ORS;  Service: General;  Laterality: N/A;   THYROIDECTOMY  2012   TOTAL ABDOMINAL HYSTERECTOMY     HPI:  76 y.o. female with medical history significant for chronic pain, COPD, hypothyroidism, hypertension, and tobacco abuse, presenting to the emergency department with dizziness, nausea, and vomiting.     Patient reports that she woke in her usual state of health the morning of 10-17-2021, but then developed a room spinning sensation with nausea around noon.  Symptoms persisted, she was having difficulty ambulating due to the symptoms, had 1 episode of vomiting, and came to the ED for evaluation.     She reports discontinuing her blood pressure medications roughly 2 weeks ago due to dizziness.  She also notes that she just completed a course of azithromycin for sinus infection; MRI brain indicated Multifocal acute/early subacute ischemia within the right  cerebellum, predominantly located in the right PICA territory. No  hemorrhage or mass effect.  2. Old right PCA territory infarct and chronic infarct/hemorrhage in  the left cerebellum; SLE generated to assess speech/language/cognition.   Assessment / Plan / Recommendation Clinical  Impression  Pt assessed via the [employer] Mental Status Examination (SLUMS) with a score obtained of 19/30 with a typical score on this  assessment being 27/30.  Pt exhibited deficits in the areas of attention, memory, recall/storage of new information and spatial organization.  Pt oriented to self, place and situation, but not time.  Pain may have impacted overall score as pt stated her pain was 6/10 with visual deficits impacting certain portions of this assessment.  Deficits also observed were word fluency naming task, simple calculation and other attention/memory tasks.   Pt able to repeat digits backwards, answer questions from a paragraph, and was able to form thoughts/answer questions without difficulty.  Family denotes pt "has been different" for the past several weeks. Recommend ST f/u for cognitive/linguistic deficits while in acute setting.  Thank you for this consult.    SLP Assessment  SLP Recommendation/Assessment: Patient needs continued Speech Language Pathology Services SLP Visit Diagnosis: Cognitive communication deficit (R41.841);Attention and concentration deficit Attention and concentration deficit following: Cerebral infarction    Recommendations for follow up therapy are one component of a multi-disciplinary discharge planning process, led by the attending physician.  Recommendations may be updated based on patient status, additional functional criteria and insurance authorization.    Follow Up Recommendations  Follow physician's recommendations for discharge plan and follow up therapies    Assistance Recommended at Discharge  Frequent or constant Supervision/Assistance  Functional Status Assessment Patient has had a recent decline in their functional status and demonstrates the ability to make significant improvements in function in a reasonable and predictable amount of time.  Frequency and Duration min 2x/week  1 week      SLP Evaluation Cognition  Overall Cognitive Status: Impaired/Different from baseline Arousal/Alertness: Awake/alert Orientation Level: Oriented to person;Oriented to place;Oriented  to situation;Disoriented to time Year: 2023 Month: August Day of Week: Incorrect Attention: Sustained Sustained Attention: Impaired Sustained Attention Impairment: Verbal basic;Functional basic Memory: Impaired Memory Impairment: Retrieval deficit;Storage deficit;Decreased recall of new information;Decreased short term memory Decreased Short Term Memory: Verbal basic;Functional basic Awareness: Impaired Awareness Impairment: Anticipatory impairment Problem Solving: Impaired Problem Solving Impairment: Verbal complex;Functional complex Executive Function: Landscape architect: Impaired Organizing Impairment: Verbal basic;Functional basic Behaviors: Perseveration;Verbal agitation;Other (comment) (pain could be a contributing factor) Safety/Judgment: Appears intact       Comprehension  Auditory Comprehension Overall Auditory Comprehension: Appears within functional limits for tasks assessed Yes/No Questions: Within Functional Limits Commands: Not tested Conversation: Simple Interfering Components: Pain;Attention;Working Radio broadcast assistant: Dietitian: Not tested Reading Comprehension Reading Status: Not tested    Expression Expression Primary Mode of Expression: Verbal Verbal Expression Overall Verbal Expression: Appears within functional limits for tasks assessed Level of Generative/Spontaneous Verbalization: Conversation Naming: Not tested [employer] of Communication: Not applicable Written Expression Dominant Hand: Right Written Expression: Exceptions to [facility] Interfering Components: Other (comment);Spatial organization (visual deficits)   Oral / Motor  Oral Motor/Sensory Function Overall Oral Motor/Sensory Function: Within functional limits Motor Speech Overall Motor Speech: Appears within functional limits for tasks assessed Respiration: Within functional limits Phonation:  Normal Resonance: Within functional limits Articulation: Within functional limitis Intelligibility: Intelligible Motor Planning: Witnin functional limits Motor Speech Errors: Not applicable            Tressie Stalker, M.S., CCC-SLP 10/03/2021, 1:59 PM

## 2021-10-03 NOTE — Evaluation (Signed)
Occupational Therapy Evaluation Patient Details Name: [name] MRN: [id number] DOB: 25-Oct-1945 Today's Date: 10/03/2021   History of Present Illness Pt is a 76 y/o female admitted secondary to dizziness and nausea/vomiting. 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 PMH includes COPD, HTN, and tobacco use.   Clinical Impression   Pt admitted for concerns listed above. PTA pt reported that she is independent with all ADL's and functional mobility. At this time, pt is requiring mod A +2 for functional mobility, however she is unable to tolerate stepping due to RLE knee buckling. Pt reports dizziness intermittently and nystagmus is noted throughout the session. Recommending AIR to maximize pt's strength and safety. OT will follow acutely.       Recommendations for follow up therapy are one component of a multi-disciplinary discharge planning process, led by the attending physician.  Recommendations may be updated based on patient status, additional functional criteria and insurance authorization.   Follow Up Recommendations  Acute inpatient rehab (3hours/day)    Assistance Recommended at Discharge Frequent or constant Supervision/Assistance  Patient can return home with the following Two people to help with walking and/or transfers;Two people to help with bathing/dressing/bathroom;Assistance with cooking/housework;Direct supervision/assist for medications management;Help with stairs or ramp for entrance    Functional Status Assessment  Patient has had a recent decline in their functional status and demonstrates the ability to make significant improvements in function in a reasonable and predictable amount of time.  Equipment Recommendations  Other (comment) (defer to next venue)    Recommendations for Other Services Rehab consult     Precautions / Restrictions Precautions Precautions: Fall Restrictions Weight Bearing Restrictions: No      Mobility Bed Mobility Overal bed mobility:  Needs Assistance Bed Mobility: Supine to Sit, Sit to Supine     Supine to sit: Min assist Sit to supine: Min assist   General bed mobility comments: assist with trunk and LEs.    Transfers Overall transfer level: Needs assistance Equipment used: 2 person hand held assist Transfers: Sit to/from Stand Sit to Stand: Mod assist, +2 physical assistance           General transfer comment: Stood X2. Pt with buckling in RLE and requiring manual blocking. Unable to take side steps.      Balance Overall balance assessment: Needs assistance Sitting-balance support: Feet supported, Bilateral upper extremity supported Sitting balance-Leahy Scale: Poor Sitting balance - Comments: Reliant on BUE support                                   ADL either performed or assessed with clinical judgement   ADL Overall ADL's : Needs assistance/impaired Eating/Feeding: Set up;Sitting   Grooming: Set up;Sitting   Upper Body Bathing: Min guard;Sitting   Lower Body Bathing: Moderate assistance;+2 for safety/equipment;+2 for physical assistance;Sitting/lateral leans;Sit to/from stand   Upper Body Dressing : Min guard;Sitting   Lower Body Dressing: Moderate assistance;+2 for physical assistance;+2 for safety/equipment;Sitting/lateral leans;Sit to/from stand   Toilet Transfer: Moderate assistance;+2 for physical assistance;+2 for safety/equipment;Stand-pivot   Toileting- Clothing Manipulation and Hygiene: Moderate assistance;+2 for physical assistance;+2 for safety/equipment;Sitting/lateral lean;Sit to/from stand       Functional mobility during ADLs: Moderate assistance;+2 for physical assistance;+2 for safety/equipment General ADL Comments: Increased assist due to weakness, balance deficits, dizziness, and nausea     Vision Baseline Vision/History: 1 Wears glasses Ability to See in Adequate Light: 0 Adequate Patient Visual Report: No  change from baseline Vision Assessment?:  Vision impaired- to be further tested in functional context Additional Comments: Dizziness     Perception     Praxis      Pertinent Vitals/Pain Pain Assessment Pain Assessment: Faces Faces Pain Scale: Hurts little more Pain Location: generalized Pain Descriptors / Indicators: Discomfort, Grimacing Pain Intervention(s): Monitored during session, Repositioned     Hand Dominance Right   Extremity/Trunk Assessment Upper Extremity Assessment Upper Extremity Assessment: Generalized weakness (arthritic joints in fingers)   Lower Extremity Assessment Lower Extremity Assessment: Defer to PT evaluation   Cervical / Trunk Assessment Cervical / Trunk Assessment: Kyphotic   Communication Communication Communication: No difficulties   Cognition Arousal/Alertness: Awake/alert Behavior During Therapy: WFL for tasks assessed/performed Overall Cognitive Status: Impaired/Different from baseline Area of Impairment: Problem solving, Following commands                       Following Commands: Follows one step commands with increased time     Problem Solving: Slow processing       General Comments  VSS on 2L    Exercises     Shoulder Instructions      Home Living Family/patient expects to be discharged to:: Private residence Living Arrangements: Alone Available Help at Discharge: Family;Available PRN/intermittently Type of Home: Other(Comment) (townhome) Home Access: Stairs to enter [employer] of Steps: 2-3 Entrance Stairs-Rails: None Home Layout: Two level Alternate Level Stairs-Number of Steps: flight Alternate Level Stairs-Rails: Left Bathroom Shower/Tub: Producer, television/film/video: Handicapped height     Home Equipment: None          Prior Functioning/Environment Prior Level of Function : Independent/Modified Independent;Driving                        OT Problem List: Decreased strength;Decreased activity  tolerance;Impaired balance (sitting and/or standing);Decreased safety awareness;Cardiopulmonary status limiting activity      OT Treatment/Interventions: Self-care/ADL training;Therapeutic exercise;Energy conservation;DME and/or AE instruction;Therapeutic activities;Patient/family education;Balance training    OT Goals(Current goals can be found in the care plan section) Acute Rehab OT Goals Patient Stated Goal: To get stronger OT Goal Formulation: With patient Time For Goal Achievement: 10/17/21 Potential to Achieve Goals: Good  OT Frequency: Min 2X/week    Co-evaluation PT/OT/SLP Co-Evaluation/Treatment: Yes Reason for Co-Treatment: For patient/therapist safety;To address functional/ADL transfers   OT goals addressed during session: ADL's and self-care      AM-PAC OT "6 Clicks" Daily Activity     Outcome Measure Help from another person eating meals?: A Little Help from another person taking care of personal grooming?: A Little Help from another person toileting, which includes using toliet, bedpan, or urinal?: A Lot Help from another person bathing (including washing, rinsing, drying)?: A Lot Help from another person to put on and taking off regular upper body clothing?: A Little Help from another person to put on and taking off regular lower body clothing?: A Lot 6 Click Score: 15   End of Session Equipment Utilized During Treatment: Gait belt;Rolling walker (2 wheels) Nurse Communication: Mobility status  Activity Tolerance: Patient tolerated treatment well Patient left: in bed;with call bell/phone within reach;with family/visitor present  OT Visit Diagnosis: Unsteadiness on feet (R26.81);Other abnormalities of gait and mobility (R26.89);Muscle weakness (generalized) (M62.81)                Time: 7829-5621 OT Time Calculation (min): 11 min Charges:  OT General Charges $OT Visit: 1 Visit OT  Evaluation $OT Eval Moderate Complexity: 1 Mod  Glen Kesinger, OTR/L  [facility]  Elane Bing Plume 10/03/2021, 6:37 PM

## 2021-10-03 NOTE — Evaluation (Signed)
Clinical/Bedside Swallow Evaluation Patient Details  Name: [name] MRN: [id number] Date of Birth: Jun 20, 1945  Today's Date: 10/03/2021 Time: SLP Start Time (ACUTE ONLY): 1130 SLP Stop Time (ACUTE ONLY): 1155 SLP Time Calculation (min) (ACUTE ONLY): 25 min  Past Medical History:  Past Medical History:  Diagnosis Date   Acute bronchitis    Anemia    Arthritis    Chronic rhinitis    COPD (chronic obstructive pulmonary disease) (HCC)    GERD (gastroesophageal reflux disease)    Headache    Hyperthyroidism 2012   thyroidectomy   Hypothyroidism    Thyroid disease    Tobacco use disorder    Past Surgical History:  Past Surgical History:  Procedure Laterality Date   ESOPHAGOGASTRODUODENOSCOPY  10/22/2011   Procedure: ESOPHAGOGASTRODUODENOSCOPY (EGD);  Surgeon: Willis Modena, MD;  Location: [facility] ENDOSCOPY;  Service: Endoscopy;  Laterality: Left;   ESOPHAGOGASTRODUODENOSCOPY N/A 07/24/2014   Procedure: ESOPHAGOGASTRODUODENOSCOPY (EGD);  Surgeon: Charolett Bumpers, MD;  Location: [facility] ENDOSCOPY;  Service: Endoscopy;  Laterality: N/A;   GASTRIC OUTLET OBSTRUCTION RELEASE     7 years ago   PARTIAL GASTRECTOMY N/A 09/25/2014   Procedure: subtotal gastrectomy resection gastric jejunal anastamosis feeding jejunostomy roux -en-y reconstruction;  Surgeon: Glenna Fellows, MD;  Location: [facility] ORS;  Service: General;  Laterality: N/A;   THYROIDECTOMY  2012   TOTAL ABDOMINAL HYSTERECTOMY     HPI:  76 y.o. female with medical history significant for chronic pain, COPD, hypothyroidism, hypertension, and tobacco abuse, presenting to the emergency department with dizziness, nausea, and vomiting on 2021/10/12.  Patient reports that she woke in her usual state of health the morning of 2021/10/12, but then developed a room spinning sensation with nausea around noon.  Symptoms persisted, she was having difficulty ambulating due to the symptoms, had 1 episode of vomiting, and came to the ED for evaluation.    She reports discontinuing her blood pressure medications roughly 2 weeks ago due to dizziness.  She also notes that she just completed a course of azithromycin for sinus infection; MRI brain indicated Multifocal acute/early subacute ischemia within the right  cerebellum, predominantly located in the right PICA territory. No  hemorrhage or mass effect.  2. 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  [facility] not completed.    Assessment / Plan / Recommendation  Clinical Impression  Pt assessed via clinical swallowing evaluation with a normal oropharyngeal swallow noted with increased mastication efforts d/t edentulous state primarily.  Slight oral holding/decreased bolus manipulation d/t missing dentures, but pt and family state she does not eat with dentures placed in her oral cavity.  Pt denotes eating softer consistencies at home d/t missing dentition.  OME/CN assessment unremarkable.  No overt s/s of aspiration noted with any consistency assessed.  Pt independently utilized a slow rate and consumed small bites/sips during intake.  Recommend a regular diet with thin liquids using general swallowing precautions with pt preferred foods.  ST will not f/u in acute setting for swallowing tx, but cognitive/linguistic deficits only. SLP Visit Diagnosis: Dysphagia, unspecified (R13.10) Attention and concentration deficit following: Cerebral infarction    Aspiration Risk  Mild aspiration risk    Diet Recommendation   Regular/thin liquids (pt preferred)  Medication Administration: Whole meds with liquid    Other  Recommendations Oral Care Recommendations: Oral care BID    Recommendations for follow up therapy are one component of a multi-disciplinary discharge planning process, led by the attending  physician.  Recommendations may be updated based on patient status, additional functional criteria and insurance  authorization.  Follow up Recommendations Follow physician's recommendations for discharge plan and follow up therapies      Assistance Recommended at Discharge Intermittent Supervision/Assistance  Functional Status Assessment Patient has had a recent decline in their functional status and demonstrates the ability to make significant improvements in function in a reasonable and predictable amount of time.  Frequency and Duration min 2x/week          Prognosis Prognosis for Safe Diet Advancement: Good      Swallow Study   General Date of Onset: 09/26/2021 HPI: 76 y.o. female with medical history significant for chronic pain, COPD, hypothyroidism, hypertension, and tobacco abuse, presenting to the emergency department with dizziness, nausea, and vomiting.     Patient reports that she woke in her usual state of health the morning of 10/06/2021, but then developed a room spinning sensation with nausea around noon.  Symptoms persisted, she was having difficulty ambulating due to the symptoms, had 1 episode of vomiting, and came to the ED for evaluation.     She reports discontinuing her blood pressure medications roughly 2 weeks ago due to dizziness.  She also notes that she just completed a course of azithromycin for sinus infection; MRI brain indicated Multifocal acute/early subacute ischemia within the right  cerebellum, predominantly located in the right PICA territory. No  hemorrhage or mass effect.  2. 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  [facility] not completed. Type of Study: Bedside Swallow Evaluation Previous Swallow Assessment: n/a Diet Prior to this Study: NPO Temperature Spikes Noted: No Respiratory Status: Room air History of Recent Intubation: No Behavior/Cognition: Alert;Cooperative Oral Cavity Assessment: Within Functional Limits Oral Care Completed by SLP: Other (Comment) (Pt kindly declined) Oral  Cavity - Dentition: Edentulous (has dentures, but does not use for mastication per family report) Vision: Functional for self-feeding Self-Feeding Abilities: Able to feed self;Needs assist Patient Positioning: Upright in bed Baseline Vocal Quality: Normal Volitional Cough: Strong Volitional Swallow: Able to elicit    Oral/Motor/Sensory Function Overall Oral Motor/Sensory Function: Within functional limits   Ice Chips Ice chips: Not tested   Thin Liquid Thin Liquid: Within functional limits Presentation: Cup;Straw    Nectar Thick Nectar Thick Liquid: Not tested   Honey Thick Honey Thick Liquid: Not tested   Puree Puree: Within functional limits Presentation: Self Fed Other Comments: Slight oral holding, but adequate   Solid     Solid: Impaired Presentation: Self Fed Oral Phase Impairments: Reduced lingual movement/coordination Oral Phase Functional Implications: Oral holding;Prolonged oral transit      Tressie Stalker, M.S., CCC-SLP 10/03/2021,2:12 PM

## 2021-10-03 NOTE — Progress Notes (Signed)
Echocardiogram 2D Echocardiogram has been performed.  [name] 10/03/2021, 2:07 PM

## 2021-10-03 NOTE — Evaluation (Signed)
Physical Therapy Evaluation Patient Details Name: HUNTLEY DEMEDEIROS MRN: [id number] DOB: [birth year] Today's Date: 10/03/2021  History of Present Illness  Pt is a 76 y/o female admitted secondary to dizziness and nausea/vomiting. 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 PMH includes COPD, HTN, and tobacco use.  Clinical Impression  Pt admitted secondary to problem above with deficits below. Pt requiring mod A +2 to stand. Noted significant buckling in RLE and requiring manual blocking. Unable to take steps. Noted nystagmus throughout, however, pt reports dizziness isn't as severe. Pt previously independent. Recommending AIR level therapies to increase independence and safety. Will continue to follow acutely.        Recommendations for follow up therapy are one component of a multi-disciplinary discharge planning process, led by the attending physician.  Recommendations may be updated based on patient status, additional functional criteria and insurance authorization.  Follow Up Recommendations Acute inpatient rehab (3hours/day)      Assistance Recommended at Discharge Frequent or constant Supervision/Assistance  Patient can return home with the following  Two people to help with walking and/or transfers;Two people to help with bathing/dressing/bathroom;Assistance with cooking/housework;Help with stairs or ramp for entrance;Assist for transportation    Equipment Recommendations Other (comment) (TBD)  Recommendations for Other Services       Functional Status Assessment Patient has had a recent decline in their functional status and demonstrates the ability to make significant improvements in function in a reasonable and predictable amount of time.     Precautions / Restrictions Precautions Precautions: Fall Restrictions Weight Bearing Restrictions: No      Mobility  Bed Mobility Overal bed mobility: Needs Assistance Bed Mobility: Supine to Sit, Sit to Supine     Supine to sit: Min  assist Sit to supine: Min assist   General bed mobility comments: assist with trunk and LEs.    Transfers Overall transfer level: Needs assistance Equipment used: 2 person hand held assist Transfers: Sit to/from Stand Sit to Stand: Mod assist, +2 physical assistance           General transfer comment: Stood X2. Pt with buckling in RLE and requiring manual blocking. Unable to take side steps.    Ambulation/Gait                  Stairs            Wheelchair Mobility    Modified Rankin (Stroke Patients Only)       Balance Overall balance assessment: Needs assistance Sitting-balance support: Feet supported, Bilateral upper extremity supported Sitting balance-Leahy Scale: Poor Sitting balance - Comments: Reliant on BUE support                                     Pertinent Vitals/Pain Pain Assessment Pain Assessment: Faces Faces Pain Scale: Hurts little more Pain Location: generalized Pain Descriptors / Indicators: Discomfort, Grimacing Pain Intervention(s): Monitored during session, Limited activity within patient's tolerance, Repositioned    Home Living Family/patient expects to be discharged to:: Private residence Living Arrangements: Alone Available Help at Discharge: Family;Available PRN/intermittently Type of Home: Other(Comment) (townhome) Home Access: Stairs to enter Entrance Stairs-Rails: None Entrance Stairs-Number of Steps: 2-3 Alternate Level Stairs-Number of Steps: flight Home Layout: Two level Home Equipment: None      Prior Function Prior Level of Function : Independent/Modified Independent;Driving  Hand Dominance   Dominant Hand: Right    Extremity/Trunk Assessment   Upper Extremity Assessment Upper Extremity Assessment: Defer to OT evaluation    Lower Extremity Assessment Lower Extremity Assessment: RLE deficits/detail RLE Deficits / Details: MMT at 4/5, however, functional  weakness noted and pt with notable buckling with weightbearing    Cervical / Trunk Assessment Cervical / Trunk Assessment: Kyphotic  Communication   Communication: No difficulties  Cognition Arousal/Alertness: Awake/alert Behavior During Therapy: WFL for tasks assessed/performed Overall Cognitive Status: Impaired/Different from baseline Area of Impairment: Problem solving, Following commands                       Following Commands: Follows one step commands with increased time     Problem Solving: Slow processing          General Comments General comments (skin integrity, edema, etc.): Noted constant central nystagmus    Exercises     Assessment/Plan    PT Assessment Patient needs continued PT services  PT Problem List Decreased strength;Decreased activity tolerance;Decreased balance;Decreased mobility;Decreased coordination;Decreased knowledge of use of DME;Decreased safety awareness;Decreased knowledge of precautions       PT Treatment Interventions DME instruction;Gait training;Stair training;Functional mobility training;Therapeutic activities;Therapeutic exercise;Balance training;Patient/family education    PT Goals (Current goals can be found in the Care Plan section)  Acute Rehab PT Goals Patient Stated Goal: to be independent PT Goal Formulation: With patient Time For Goal Achievement: 10/17/21 Potential to Achieve Goals: Good    Frequency Min 4X/week     Co-evaluation PT/OT/SLP Co-Evaluation/Treatment: Yes Reason for Co-Treatment: For patient/therapist safety;To address functional/ADL transfers PT goals addressed during session: Mobility/safety with mobility;Balance         AM-PAC PT "6 Clicks" Mobility  Outcome Measure Help needed turning from your back to your side while in a flat bed without using bedrails?: A Little Help needed moving from lying on your back to sitting on the side of a flat bed without using bedrails?: A Little Help  needed moving to and from a bed to a chair (including a wheelchair)?: A Lot Help needed standing up from a chair using your arms (e.g., wheelchair or bedside chair)?: A Lot Help needed to walk in hospital room?: Total Help needed climbing 3-5 steps with a railing? : Total 6 Click Score: 12    End of Session Equipment Utilized During Treatment: Gait belt Activity Tolerance: Patient tolerated treatment well Patient left: in bed;with call bell/phone within reach;with family/visitor present (on stretcher in ED) Nurse Communication: Mobility status PT Visit Diagnosis: Unsteadiness on feet (R26.81);Muscle weakness (generalized) (M62.81);Dizziness and giddiness (R42)    Time: 5035-4656 PT Time Calculation (min) (ACUTE ONLY): 12 min   Charges:   PT Evaluation $PT Eval Moderate Complexity: 1 Mod          Farley Ly, PT, DPT  Acute Rehabilitation Services  Office: [phone]   Lehman Prom 10/03/2021, 3:30 PM

## 2021-10-03 NOTE — ED Provider Notes (Signed)
I assumed care of this patient.  Please see previous provider note for further details of Hx, PE.  Briefly patient is a 76 y.o. female who presented with sudden onset dizziness.  No other neurologic deficits noted by Dr. Wallace Cullens.  CTA ordered and revealed right PICA infarct. Plan to consult neurology and admit patient.  I consulted Dr. Iver Nestle, from neurology service who will evaluate patient and determine appropriate level of admission.  After assessment Dr. Iver Nestle recommended stepdown unit. Admitted to hospital service        Aliyah Abeyta, Amadeo Garnet, MD 10/03/21 [phone]

## 2021-10-03 NOTE — Progress Notes (Signed)
Received pt on the unit. Pt oriented to floor with her call bell nearby,

## 2021-10-04 DIAGNOSIS — I160 Hypertensive urgency: Secondary | ICD-10-CM | POA: Diagnosis not present

## 2021-10-04 DIAGNOSIS — I639 Cerebral infarction, unspecified: Secondary | ICD-10-CM | POA: Diagnosis not present

## 2021-10-04 DIAGNOSIS — E0590 Thyrotoxicosis, unspecified without thyrotoxic crisis or storm: Secondary | ICD-10-CM

## 2021-10-04 LAB — T4, FREE: Free T4: 1.04 ng/dL (ref 0.61–1.12)

## 2021-10-04 LAB — TSH: TSH: <0.01 u[IU]/mL — ABNORMAL LOW (ref 0.350–4.500)

## 2021-10-04 MED ORDER — NICOTINE 21 MG/24HR TD PT24
21.0000 mg | MEDICATED_PATCH | Freq: Every day | TRANSDERMAL | Status: DC
  Administered 2021-10-04 – 2021-10-07 (×4): 21 mg via TRANSDERMAL
  Filled 2021-10-04 (×4): qty 1

## 2021-10-04 MED ORDER — CARVEDILOL 3.125 MG PO TABS
3.1250 mg | ORAL_TABLET | Freq: Two times a day (BID) | ORAL | Status: DC
  Administered 2021-10-04 – 2021-10-05 (×3): 3.125 mg via ORAL
  Filled 2021-10-04 (×4): qty 1

## 2021-10-04 MED ORDER — SODIUM CHLORIDE 0.9 % IV SOLN: INTRAVENOUS | Status: AC

## 2021-10-04 MED ORDER — LEVOTHYROXINE SODIUM 25 MCG PO TABS: 25.0000 ug | ORAL_TABLET | Freq: Every day | ORAL | Status: DC

## 2021-10-04 MED ORDER — ASPIRIN 81 MG PO TBEC
81.0000 mg | DELAYED_RELEASE_TABLET | Freq: Every day | ORAL | Status: DC
  Administered 2021-10-04 – 2021-10-05 (×2): 81 mg via ORAL
  Filled 2021-10-04 (×3): qty 1

## 2021-10-04 MED ORDER — OXYCODONE HCL 5 MG PO TABS
10.0000 mg | ORAL_TABLET | Freq: Four times a day (QID) | ORAL | Status: DC | PRN
  Administered 2021-10-04: 10 mg via ORAL
  Filled 2021-10-04: qty 2

## 2021-10-04 MED ORDER — MELATONIN 3 MG PO TABS
3.0000 mg | ORAL_TABLET | Freq: Every day | ORAL | Status: DC
  Administered 2021-10-04 – 2021-10-05 (×2): 3 mg via ORAL
  Filled 2021-10-04 (×2): qty 1

## 2021-10-04 MED ORDER — OXYCODONE HCL 5 MG PO TABS
5.0000 mg | ORAL_TABLET | Freq: Four times a day (QID) | ORAL | Status: DC | PRN
  Administered 2021-10-04 – 2021-10-05 (×4): 5 mg via ORAL
  Filled 2021-10-04 (×4): qty 1

## 2021-10-04 NOTE — Progress Notes (Signed)
[facility] PROGRESS NOTE    Progress Note  [name]  [id number] DOB: [birth year] DOA: 10/01/2021 PCP: Wenda Low, MD     Brief Narrative:   [name] is an 76 y.o. female past medical history significant for chronic pain, COPD hypothyroidism tobacco abuse comes into the emergency room complaining of dizziness nausea and vomiting.  He relates the room is spinning around him CTA of the head and neck Showed a acute/subacute infarct of the right PICA no hemorrhage or mass effect and multiple other occlusions of the left vertebral artery at the V2 segment, severe stenosis of the left PCA P2 segment.  MRI confirmed multifocal acute/subacute infarct in the right cerebellum  Assessment/Plan:   Acute ischemic stroke [name]): HgbA1c 5.3, fasting lipid panel  HDL > 40, LDL > 120 MRI, acute CVA to the cerebellum. PT, OT, recommended inpatient rehab. CTA of the head showed right PCA territory stroke and multiple severe other arteries occlusions. Transthoracic Echo, EF of 123456, Grade 1 diastolic heart failure Continue ASA 81mg  daily and plavix 75mg  daily, was previously on no antiplatelet therapy.  Then she will continue aspirin daily. High-dose statins. BP goal: permissive HTN upto 220/120 mmHg No events on telemetry.  There is on board and recommended CT of the head if any neuro changes due to increased risk of reactive cerebral edema with cerebellar infarcts. Awaiting inpatient rehab admission.  Essential hypertension: Allow permissive hypertension in the setting of acute CVA.   Blood pressure is actually trending greater than 200 started on low-dose Coreg.  COPD with emphysema ([place]) Stable continue inhalers.  Hypothyroidism: Continue Synthroid.  Chronic pain syndrome She relates the medication is not lasting long enough since she has been on this bed. Change her to oxy orally. Started on MiraLAX p.o. twice daily.  DVT prophylaxis: lovenox Family  Communication:daughter Status is: Observation The patient will require care spanning > 2 midnights and should be moved to inpatient because: Acute CVA    Code Status:     Code Status Orders  (From admission, onward)           Start     Ordered   10/03/21 0330  Full code  Continuous        10/03/21 0331           Code Status History     Date Active Date Inactive Code Status Order ID Comments User Context   09/25/2014 1716 10/03/2014 1457 Full Code [id number]  Excell Seltzer, MD Inpatient         IV Access:   Peripheral IV   Procedures and diagnostic studies:   ECHOCARDIOGRAM COMPLETE  Result Date: 10/03/2021    ECHOCARDIOGRAM REPORT   Patient Name:   [name] Date of Exam: 10/03/2021 Medical Rec #:  [id number]       Height:       62.0 in Accession #:    [id number]      Weight:       80.8 lb Date of Birth:  09-Jan-1946       BSA:          1.301 m Patient Age:    47 years        BP:           168/67 mmHg Patient Gender: F               HR:           93 bpm. Exam Location:  Inpatient Procedure:  2D Echo, Cardiac Doppler and Color Doppler Indications:    Stroke I63.9  History:        Patient has no prior history of Echocardiogram examinations.                 COPD; Risk Factors:Current Smoker and GERD.  Sonographer:    Bernadene Person RDCS Referring Phys: [id number] [employer]  1. Left ventricular ejection fraction, by estimation, is 65 to 70%. The left ventricle has normal function. The left ventricle has no regional wall motion abnormalities. There is mild asymmetric left ventricular hypertrophy of the basal-septal segment. Left ventricular diastolic parameters are consistent with Grade I diastolic dysfunction (impaired relaxation).  2. Right ventricular systolic function is normal. The right ventricular size is normal. There is normal pulmonary artery systolic pressure.  3. The mitral valve is normal in structure. Mild mitral valve regurgitation. No evidence  of mitral stenosis.  4. The aortic valve is tricuspid. Aortic valve regurgitation is not visualized. No aortic stenosis is present.  5. The inferior vena cava is normal in size with greater than 50% respiratory variability, suggesting right atrial pressure of 3 mmHg. FINDINGS  Left Ventricle: Left ventricular ejection fraction, by estimation, is 65 to 70%. The left ventricle has normal function. The left ventricle has no regional wall motion abnormalities. The left ventricular internal cavity size was normal in size. There is  mild asymmetric left ventricular hypertrophy of the basal-septal segment. Left ventricular diastolic parameters are consistent with Grade I diastolic dysfunction (impaired relaxation). Normal left ventricular filling pressure. Right Ventricle: The right ventricular size is normal. No increase in right ventricular wall thickness. Right ventricular systolic function is normal. There is normal pulmonary artery systolic pressure. The tricuspid regurgitant velocity is 2.09 m/s, and  with an assumed right atrial pressure of 3 mmHg, the estimated right ventricular systolic pressure is 123XX123 mmHg. Left Atrium: Left atrial size was normal in size. Right Atrium: Right atrial size was normal in size. Pericardium: There is no evidence of pericardial effusion. Mitral Valve: The mitral valve is normal in structure. Mild mitral valve regurgitation. No evidence of mitral valve stenosis. Tricuspid Valve: The tricuspid valve is normal in structure. Tricuspid valve regurgitation is trivial. No evidence of tricuspid stenosis. Aortic Valve: The aortic valve is tricuspid. Aortic valve regurgitation is not visualized. No aortic stenosis is present. Pulmonic Valve: The pulmonic valve was normal in structure. Pulmonic valve regurgitation is not visualized. No evidence of pulmonic stenosis. Aorta: The aortic root is normal in size and structure. Venous: The inferior vena cava is normal in size with greater than 50%  respiratory variability, suggesting right atrial pressure of 3 mmHg. IAS/Shunts: No atrial level shunt detected by color flow Doppler.  LEFT VENTRICLE PLAX 2D LVIDd:         3.40 cm     Diastology LVIDs:         2.60 cm     LV e' medial:    5.54 cm/s LV PW:         0.90 cm     LV E/e' medial:  9.5 LV IVS:        1.10 cm     LV e' lateral:   5.73 cm/s LVOT diam:     2.00 cm     LV E/e' lateral: 9.2 LV SV:         51 LV SV Index:   39 LVOT Area:     3.14 cm  LV Volumes (MOD)  LV vol d, MOD A2C: 43.9 ml LV vol d, MOD A4C: 45.0 ml LV vol s, MOD A2C: 18.3 ml LV vol s, MOD A4C: 19.7 ml LV SV MOD A2C:     25.6 ml LV SV MOD A4C:     45.0 ml LV SV MOD BP:      27.0 ml RIGHT VENTRICLE TAPSE (M-mode): 2.3 cm LEFT ATRIUM             Index        RIGHT ATRIUM          Index LA diam:        3.00 cm 2.31 cm/m   RA Area:     9.20 cm LA Vol (A2C):   32.7 ml 25.14 ml/m  RA Volume:   18.00 ml 13.84 ml/m LA Vol (A4C):   24.2 ml 18.61 ml/m LA Biplane Vol: 30.1 ml 23.14 ml/m  AORTIC VALVE LVOT Vmax:   91.67 cm/s LVOT Vmean:  54.633 cm/s LVOT VTI:    0.162 m  AORTA Ao Root diam: 3.00 cm Ao Asc diam:  2.90 cm MITRAL VALVE               TRICUSPID VALVE MV Area (PHT): 1.94 cm    TR Peak grad:   17.5 mmHg MV Decel Time: 391 msec    TR Vmax:        209.00 cm/s MV E velocity: 52.70 cm/s MV A velocity: 64.30 cm/s  SHUNTS MV E/A ratio:  0.82        Systemic VTI:  0.16 m                            Systemic Diam: 2.00 cm Skeet Latch MD Electronically signed by Skeet Latch MD Signature Date/Time: 10/03/2021/3:25:51 PM    Final    MR BRAIN WO CONTRAST  Result Date: 10/03/2021 CLINICAL DATA:  Stroke follow-up EXAM: MRI HEAD WITHOUT CONTRAST TECHNIQUE: Multiplanar, multiecho pulse sequences of the brain and surrounding structures were obtained without intravenous contrast. COMPARISON:  None Available. FINDINGS: Brain: Multifocal acute/early subacute ischemia within the right cerebellum, predominantly located in the right PICA  territory. No supratentorial acute ischemia. Chronic infarct and chronic hemorrhage in the left cerebellum. Old right PCA territory infarct. There is multifocal periventricular white matter hyperintensity, most often a result of chronic microvascular ischemia. The midline structures are normal. Vascular: Major flow voids are preserved. Skull and upper cervical spine: Normal calvarium and skull base. Visualized upper cervical spine and soft tissues are normal. Sinuses/Orbits:No paranasal sinus fluid levels or advanced mucosal thickening. No mastoid or middle ear effusion. Normal orbits. IMPRESSION: 1. Multifocal acute/early subacute ischemia within the right cerebellum, predominantly located in the right PICA territory. No hemorrhage or mass effect. 2. 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 Electronically Signed   By: Ulyses Jarred M.D.   On: 10/03/2021 01:48   CT ANGIO HEAD NECK W WO CM  Result Date: 10/03/2021 CLINICAL DATA:  Dizziness EXAM: CT ANGIOGRAPHY HEAD AND NECK TECHNIQUE: Multidetector CT imaging of the head and neck was performed using the standard protocol during bolus administration of intravenous contrast. Multiplanar CT image reconstructions and MIPs were obtained to evaluate the vascular anatomy. Carotid stenosis measurements (when applicable) are obtained utilizing NASCET criteria, using the distal internal carotid diameter as the denominator. RADIATION DOSE REDUCTION: This exam was performed according to the departmental dose-optimization program which includes automated exposure  control, adjustment of the mA and/or kV according to patient size and/or use of iterative reconstruction technique. CONTRAST:  35mL OMNIPAQUE IOHEXOL 350 MG/ML SOLN COMPARISON:  10/09/2020 FINDINGS: CT HEAD FINDINGS Brain: There is an acute/early subacute infarct of the right PICA territory. There are old bilateral cerebellar infarcts and an old right PCA territory  infarct. The size and configuration of the ventricles and extra-axial CSF spaces are normal. There is hypoattenuation of the periventricular white matter, most commonly indicating chronic ischemic microangiopathy. Skull: The visualized skull base, calvarium and extracranial soft tissues are normal. Sinuses/Orbits: No fluid levels or advanced mucosal thickening of the visualized paranasal sinuses. No mastoid or middle ear effusion. The orbits are normal. CTA NECK FINDINGS SKELETON: There is no bony spinal canal stenosis. No lytic or blastic lesion. OTHER NECK: Normal pharynx, larynx and major salivary glands. No cervical lymphadenopathy. Unremarkable thyroid gland. UPPER CHEST: Biapical emphysema. AORTIC ARCH: There is calcific atherosclerosis of the aortic arch. There is no aneurysm, dissection or hemodynamically significant stenosis of the visualized portion of the aorta. Conventional 3 vessel aortic branching pattern. The visualized proximal subclavian arteries are widely patent. RIGHT CAROTID SYSTEM: No dissection, occlusion or aneurysm. Mild atherosclerotic calcification at the carotid bifurcation without hemodynamically significant stenosis. LEFT CAROTID SYSTEM: Normal without aneurysm, dissection or stenosis. VERTEBRAL ARTERIES: The right vertebral artery V3 segment is occluded. The right V4 segment is incompletely opacified. The left vertebral artery is non dominant and there is multifocal occlusion or severe stenosis along the V2 segment. CTA HEAD FINDINGS POSTERIOR CIRCULATION: --Vertebral arteries: Normal V4 segments. --Inferior cerebellar arteries: Right PICA not visualized. --Basilar artery: Normal. --Superior cerebellar arteries: Normal. --Posterior cerebral arteries (PCA): Mild stenosis of the right P2 segment. Severe stenosis of the left P2 segment. ANTERIOR CIRCULATION: --Intracranial internal carotid arteries: Normal. --Anterior cerebral arteries (ACA): Mild stenosis of the right A1 segment.  Otherwise normal. --Middle cerebral arteries (MCA): Normal. VENOUS SINUSES: As permitted by contrast timing, patent. ANATOMIC VARIANTS: None Review of the MIP images confirms the above findings. IMPRESSION: 1. 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 No hemorrhage or mass effect. 2. 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 3. 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 4. Severe stenosis of the left PCA P2 segment. 5. Aortic Atherosclerosis (ICD10-I70.0) and Emphysema (ICD10-J43.9). 6. Critical Value/emergent results were called by telephone at the time of interpretation on 10/03/2021 at 12:06 am to provider Tanda Rockers , who verbally acknowledged these results. Electronically Signed   By: Deatra Robinson M.D.   On: 10/03/2021 00:07     Medical Consultants:   None.   Subjective:    [name] complaint of neck and back pain she relates this to bed.  Objective:    Vitals:   10/03/21 1729 10/03/21 2120 10/04/21 0340 10/04/21 0748  BP: (!) 133/101 (!) 160/79 (!) 179/76 (!) 153/90  Pulse: 78  67 90  Resp: 16 16 16 16   Temp: 98.7 F (37.1 C) 98.7 F (37.1 C) 98 F (36.7 C) 97.7 F (36.5 C)  TempSrc: Oral Oral  Oral  SpO2: 99% 99% 98% 98%   SpO2: 98 %  No intake or output data in the 24 hours ending 10/04/21 0906 There were no vitals filed for this visit.  Exam: General exam: In no acute distress. Respiratory system: Good air movement and clear to auscultation. Cardiovascular system: S1 & S2  heard, RRR. No JVD. Gastrointestinal system: Abdomen is nondistended, soft and nontender.  Extremities: No pedal edema. Skin: No rashes, lesions or ulcers Psychiatry: Judgement and insight appear normal. Mood & affect appropriate.   Data Reviewed:    Labs: Basic Metabolic Panel: Recent Labs  Lab 10-23-21 2011 10/03/21 0358  NA 135  134*  K 3.5 3.3*  CL 101 99  CO2 24 23  GLUCOSE 168* 132*  BUN 10 11  CREATININE 0.46 0.47  CALCIUM 9.0 8.7*    GFR CrCl cannot be calculated (Unknown ideal weight.). Liver Function Tests: Recent Labs  Lab 2021-10-23 2011 10/03/21 0358  AST 25 24  ALT 24 25  ALKPHOS 115 126  BILITOT 0.5 0.6  PROT 6.5 6.4*  ALBUMIN 4.1 4.0    Recent Labs  Lab 10-23-2021 2011  LIPASE 21    No results for input(s): "AMMONIA" in the last 168 hours. Coagulation profile No results for input(s): "INR", "PROTIME" in the last 168 hours. COVID-19 Labs  No results for input(s): "DDIMER", "FERRITIN", "LDH", "CRP" in the last 72 hours.  Lab Results  Component Value Date   SARSCOV2NAA NEGATIVE 2021-10-23    CBC: Recent Labs  Lab 2021-10-23 2011 10/03/21 0358  WBC 9.0 8.2  NEUTROABS 8.1*  --   HGB 14.4 14.4  HCT 42.7 42.0  MCV 94.5 93.8  PLT 188 180    Cardiac Enzymes: No results for input(s): "CKTOTAL", "CKMB", "CKMBINDEX", "TROPONINI" in the last 168 hours. BNP (last 3 results) No results for input(s): "PROBNP" in the last 8760 hours. CBG: No results for input(s): "GLUCAP" in the last 168 hours. D-Dimer: No results for input(s): "DDIMER" in the last 72 hours. Hgb A1c: Recent Labs    10/03/21 0358  HGBA1C 5.3   Lipid Profile: Recent Labs    10/03/21 0358  CHOL 231*  HDL 93  LDLCALC 129*  TRIG 46  CHOLHDL 2.5    Thyroid function studies: Recent Labs    10/04/21 0349  TSH <0.010*    Anemia work up: Recent Labs    10/03/21 0358  VITAMINB12 274    Sepsis Labs: Recent Labs  Lab 2021/10/23 2011 10/03/21 0358  WBC 9.0 8.2    Microbiology Recent Results (from the past 240 hour(s))  Resp Panel by RT-PCR (Flu A&B, Covid) Anterior Nasal Swab     Status: None   Collection Time: 10-23-2021  8:01 PM   Specimen: Anterior Nasal Swab  Result Value Ref Range Status   SARS Coronavirus 2 by RT PCR NEGATIVE NEGATIVE Final    Comment: (NOTE) SARS-CoV-2 target nucleic  acids are NOT DETECTED.  The SARS-CoV-2 RNA is generally detectable in upper respiratory specimens during the acute phase of infection. The lowest concentration of SARS-CoV-2 viral copies this assay can detect is 138 copies/mL. A negative result does not preclude SARS-Cov-2 infection and should not be used as the sole basis for treatment or other patient management decisions. A negative result may occur with  improper specimen collection/handling, submission of specimen other than nasopharyngeal swab, presence of viral mutation(s) within the areas targeted by this assay, and inadequate number of viral copies(<138 copies/mL). A negative result must be combined with clinical observations, patient history, and epidemiological information. The expected result is Negative.  Fact Sheet for Patients:  BloggerCourse.com  Fact Sheet for Beard Providers:  SeriousBroker.it  This test is no t yet approved or cleared by the [place] FDA and  has been authorized for detection and/or diagnosis of SARS-CoV-2 by FDA  under an Emergency Use Authorization (EUA). This EUA will remain  in effect (meaning this test can be used) for the duration of the COVID-19 declaration under Section 564(b)(1) of the Act, 21 U.S.C.section 360bbb-3(b)(1), unless the authorization is terminated  or revoked sooner.       Influenza A by PCR NEGATIVE NEGATIVE Final   Influenza B by PCR NEGATIVE NEGATIVE Final    Comment: (NOTE) The Xpert Xpress SARS-CoV-2/FLU/RSV plus assay is intended as an aid in the diagnosis of influenza from Nasopharyngeal swab specimens and should not be used as a sole basis for treatment. Nasal washings and aspirates are unacceptable for Xpert Xpress SARS-CoV-2/FLU/RSV testing.  Fact Sheet for Patients: EntrepreneurPulse.com.au  Fact Sheet for Beard Providers: IncredibleEmployment.be  This  test is not yet approved or cleared by the [place] FDA and has been authorized for detection and/or diagnosis of SARS-CoV-2 by FDA under an Emergency Use Authorization (EUA). This EUA will remain in effect (meaning this test can be used) for the duration of the COVID-19 declaration under Section 564(b)(1) of the Act, 21 U.S.C. section 360bbb-3(b)(1), unless the authorization is terminated or revoked.  Performed at [facility], [place] [address], [place], [place] [address]      Medications:     stroke: early stages of recovery book   Does not apply Once   amLODipine  10 mg Oral Daily   aspirin  300 mg Rectal Daily   Or   aspirin  325 mg Oral Daily   clopidogrel  75 mg Oral Daily   enoxaparin (LOVENOX) injection  30 mg Subcutaneous Daily   fluticasone furoate-vilanterol  1 puff Inhalation Daily   And   umeclidinium bromide  1 puff Inhalation Daily   hydrALAZINE  25 mg Oral Q8H   levothyroxine  100 mcg Oral Daily   rosuvastatin  40 mg Oral Daily   Continuous Infusions:    LOS: 1 day   Charlynne Cousins  [facility]  10/04/2021, 9:06 AM

## 2021-10-04 NOTE — Progress Notes (Signed)
Physical Therapy Treatment Patient Details Name: [name] MRN: [id number] DOB: 1945-11-21 Today's Date: 10/04/2021   History of Present Illness Pt is a 76 y/o female admitted secondary to dizziness and nausea/vomiting. 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 PMH includes COPD, HTN, and tobacco use.    PT Comments    Tolerated treatment well. Min assist with bed mobility, Mod assist for sit<>stand transfer and step pivot to chair, requiring assistance for balance and RW control. Demonstrates ataxic trunk and RLE without overt buckling of RLE today (seen yesterday per notes,) there was some slight instability of Rt knee but she was able to better control with use of RW for support. Tolerated EOB standing activities. Still with dizziness present, spontaneous nystagmus, and  +headache. Patient will continue to benefit from skilled physical therapy services to further improve independence with functional mobility. Family present in room and very supportive.   Recommendations for follow up therapy are one component of a multi-disciplinary discharge planning process, led by the attending physician.  Recommendations may be updated based on patient status, additional functional criteria and insurance authorization.  Follow Up Recommendations  Acute inpatient rehab (3hours/day)     Assistance Recommended at Discharge Frequent or constant Supervision/Assistance  Patient can return home with the following Two people to help with walking and/or transfers;Two people to help with bathing/dressing/bathroom;Assistance with cooking/housework;Help with stairs or ramp for entrance;Assist for transportation   Equipment Recommendations  Other (comment) (TBD)    Recommendations for Other Services       Precautions / Restrictions Precautions Precautions: Fall Restrictions Weight Bearing Restrictions: No     Mobility  Bed Mobility Overal bed mobility: Needs Assistance Bed Mobility: Supine to Sit      Supine to sit: Min assist     General bed mobility comments: Min assist for trunk support due to trunk instability, able to scoot to EOB.    Transfers Overall transfer level: Needs assistance Equipment used: Rolling walker (2 wheels) Transfers: Sit to/from Stand, Bed to chair/wheelchair/BSC Sit to Stand: Min assist   Step pivot transfers: Mod assist       General transfer comment: Min assist for boost to stand x2 with intial block for RLE however was not needed today. Second stand showing some Rt kene instability but without overt buckling. Leaning slightly towards Rt, apparent ataxia of trunk and RLE primarily. Mod assist for step-pivot transfer to recliner with VC for foot placement and awareness. Assistance with balance and RW placement close to proximity for support. Pt leaning towards Rt, able to take uncoordinated steps forward, lateral, and backwards with RW and mod assist for balance.    Ambulation/Gait                   Stairs             Wheelchair Mobility    Modified Rankin (Stroke Patients Only) Modified Rankin (Stroke Patients Only) Pre-Morbid Rankin Score: No symptoms Modified Rankin: Moderately severe disability     Balance Overall balance assessment: Needs assistance Sitting-balance support: Feet supported, Single extremity supported Sitting balance-Leahy Scale: Poor Sitting balance - Comments: Progressed to single UE support today, still limited by trunk ataxia.   Standing balance support: Bilateral upper extremity supported, Reliant on assistive device for balance Standing balance-Leahy Scale: Poor Standing balance comment: Stood EOB with RW support, performed heel raises and marches with cues and assist for balalance.  Cognition Arousal/Alertness: Awake/alert Behavior During Therapy: WFL for tasks assessed/performed Overall Cognitive Status: Impaired/Different from baseline Area of Impairment:  Problem solving, Following commands                       Following Commands: Follows one step commands with increased time     Problem Solving: Slow processing          Exercises      General Comments        Pertinent Vitals/Pain Pain Assessment Pain Assessment: Faces Faces Pain Scale: Hurts even more Pain Location: headache Rt side Pain Descriptors / Indicators: Discomfort, Grimacing Pain Intervention(s): Monitored during session, Repositioned    Home Living                          Prior Function            PT Goals (current goals can now be found in the care plan section) Acute Rehab PT Goals Patient Stated Goal: to be independent PT Goal Formulation: With patient Time For Goal Achievement: 10/17/21 Potential to Achieve Goals: Good Progress towards PT goals: Progressing toward goals    Frequency    Min 4X/week      PT Plan Current plan remains appropriate    Co-evaluation PT/OT/SLP Co-Evaluation/Treatment: Yes            AM-PAC PT "6 Clicks" Mobility   Outcome Measure  Help needed turning from your back to your side while in a flat bed without using bedrails?: A Little Help needed moving from lying on your back to sitting on the side of a flat bed without using bedrails?: A Little Help needed moving to and from a bed to a chair (including a wheelchair)?: A Lot Help needed standing up from a chair using your arms (e.g., wheelchair or bedside chair)?: A Lot Help needed to walk in hospital room?: Total Help needed climbing 3-5 steps with a railing? : Total 6 Click Score: 12    End of Session Equipment Utilized During Treatment: Gait belt Activity Tolerance: Patient tolerated treatment well Patient left: with call bell/phone within reach;in chair;with chair alarm set;with family/visitor present Nurse Communication: Mobility status (Headache) PT Visit Diagnosis: Unsteadiness on feet (R26.81);Muscle weakness (generalized)  (M62.81);Dizziness and giddiness (R42)     Time: 9326-7124 PT Time Calculation (min) (ACUTE ONLY): 21 min  Charges:  $Therapeutic Activity: 8-22 mins                     Kathlyn Sacramento, PT, DPT Physical Therapist Acute Rehabilitation Services [facility]    Berton Mount 10/04/2021, 4:17 PM

## 2021-10-04 NOTE — Progress Notes (Signed)
Inpatient Rehab Admissions:  Inpatient Rehab Consult received.  I met with patient at the bedside for rehabilitation assessment and to discuss goals and expectations of an inpatient rehab admission.  Sister was also present. Discussed average length of stay; visitation policy; discharge home with support. Pt acknowledged understanding. Pt gave permission to contact daughter Aldona Bar. Called [place] and received no answer; awaiting return call. Will continue to follow.  Signed: Gayland Curry, [place], [place] Admissions Coordinator [phone]

## 2021-10-04 NOTE — Progress Notes (Signed)
Inpatient Rehab Admissions Coordinator Note:   Per PT patient was screened for CIR candidacy by Troyce Gieske Luvenia Starch, CCC-SLP. At this time, pt appears to be a potential candidate for CIR. I will place an order for rehab consult for full assessment, per our protocol.  Please contact me any with questions.Wolfgang Phoenix, MS, CCC-SLP Admissions Coordinator [phone] 10/04/21 1:54 PM

## 2021-10-04 NOTE — Progress Notes (Addendum)
STROKE TEAM PROGRESS NOTE   INTERVAL HISTORY Afebrile. BP 130-180s. On amlodipine 10, hydralazine 25Q8H. Repeat TSH <0.01, free T4 1.04. A1c 5.3.Dr Rulon Eisenmenger to address hyperthyroidism  Daughters are at bedside. They report she did not sleep well last night and had poor PO intake. She reports the hospital food is not good. Discussed bringing food she likes to encourage increased PO intake. Discussed imaging findings leading to her symptoms. Discussed increased medication compliance and modifying lifestyle factors to reduce risk of recurrent stroke. Discussed recommendations for rehab.  Vital signs are stable.  Neurological exam is unchanged. Vitals:   10/03/21 1718 10/03/21 1729 10/03/21 2120 10/04/21 0340  BP: (!) 202/83 (!) 133/101 (!) 160/79 (!) 179/76  Pulse: 74 78  67  Resp: 16 16 16 16   Temp: 98.9 F (37.2 C) 98.7 F (37.1 C) 98.7 F (37.1 C) 98 F (36.7 C)  TempSrc: Oral Oral Oral   SpO2: 97% 99% 99% 98%   CBC:  Recent Labs  Lab 09/16/2021 2011 10/03/21 0358  WBC 9.0 8.2  NEUTROABS 8.1*  --   HGB 14.4 14.4  HCT 42.7 42.0  MCV 94.5 93.8  PLT 188 180   Basic Metabolic Panel:  Recent Labs  Lab 10/08/2021 2011 10/03/21 0358  NA 135 134*  K 3.5 3.3*  CL 101 99  CO2 24 23  GLUCOSE 168* 132*  BUN 10 11  CREATININE 0.46 0.47  CALCIUM 9.0 8.7*   Lipid Panel:  Recent Labs  Lab 10/03/21 0358  CHOL 231*  TRIG 46  HDL 93  CHOLHDL 2.5  VLDL 9  LDLCALC 129*   HgbA1c:  Recent Labs  Lab 10/03/21 0358  HGBA1C 5.3   Urine Drug Screen:  Recent Labs  Lab 10/03/21 0151  LABOPIA POSITIVE*  COCAINSCRNUR NONE DETECTED  LABBENZ NONE DETECTED  AMPHETMU NONE DETECTED  THCU NONE DETECTED  LABBARB POSITIVE*    Alcohol Level No results for input(s): "ETH" in the last 168 hours.  IMAGING past 24 hours ECHOCARDIOGRAM COMPLETE  Result Date: 10/03/2021    ECHOCARDIOGRAM REPORT   Patient Name:   [name] [facility] Date of Exam: 10/03/2021 Medical Rec #:  10/05/2021       Height:        62.0 in Accession #:    [id number]      Weight:       80.8 lb Date of Birth:  May 03, 1945       BSA:          1.301 m Patient Age:    76 years        BP:           168/67 mmHg Patient Gender: F               HR:           93 bpm. Exam Location:  Inpatient Procedure: 2D Echo, Cardiac Doppler and Color Doppler Indications:    Stroke I63.9  History:        Patient has no prior history of Echocardiogram examinations.                 COPD; Risk Factors:Current Smoker and GERD.  Sonographer:    03/28/1945 RDCS Referring Phys: Eulah Pont TIMOTHY S OPYD IMPRESSIONS  1. Left ventricular ejection fraction, by estimation, is 65 to 70%. The left ventricle has normal function. The left ventricle has no regional wall motion abnormalities. There is mild asymmetric left ventricular hypertrophy of the basal-septal segment. Left ventricular  diastolic parameters are consistent with Grade I diastolic dysfunction (impaired relaxation).  2. Right ventricular systolic function is normal. The right ventricular size is normal. There is normal pulmonary artery systolic pressure.  3. The mitral valve is normal in structure. Mild mitral valve regurgitation. No evidence of mitral stenosis.  4. The aortic valve is tricuspid. Aortic valve regurgitation is not visualized. No aortic stenosis is present.  5. The inferior vena cava is normal in size with greater than 50% respiratory variability, suggesting right atrial pressure of 3 mmHg. FINDINGS  Left Ventricle: Left ventricular ejection fraction, by estimation, is 65 to 70%. The left ventricle has normal function. The left ventricle has no regional wall motion abnormalities. The left ventricular internal cavity size was normal in size. There is  mild asymmetric left ventricular hypertrophy of the basal-septal segment. Left ventricular diastolic parameters are consistent with Grade I diastolic dysfunction (impaired relaxation). Normal left ventricular filling pressure. Right Ventricle: The  right ventricular size is normal. No increase in right ventricular wall thickness. Right ventricular systolic function is normal. There is normal pulmonary artery systolic pressure. The tricuspid regurgitant velocity is 2.09 m/s, and  with an assumed right atrial pressure of 3 mmHg, the estimated right ventricular systolic pressure is 20.5 mmHg. Left Atrium: Left atrial size was normal in size. Right Atrium: Right atrial size was normal in size. Pericardium: There is no evidence of pericardial effusion. Mitral Valve: The mitral valve is normal in structure. Mild mitral valve regurgitation. No evidence of mitral valve stenosis. Tricuspid Valve: The tricuspid valve is normal in structure. Tricuspid valve regurgitation is trivial. No evidence of tricuspid stenosis. Aortic Valve: The aortic valve is tricuspid. Aortic valve regurgitation is not visualized. No aortic stenosis is present. Pulmonic Valve: The pulmonic valve was normal in structure. Pulmonic valve regurgitation is not visualized. No evidence of pulmonic stenosis. Aorta: The aortic root is normal in size and structure. Venous: The inferior vena cava is normal in size with greater than 50% respiratory variability, suggesting right atrial pressure of 3 mmHg. IAS/Shunts: No atrial level shunt detected by color flow Doppler.  LEFT VENTRICLE PLAX 2D LVIDd:         3.40 cm     Diastology LVIDs:         2.60 cm     LV e' medial:    5.54 cm/s LV PW:         0.90 cm     LV E/e' medial:  9.5 LV IVS:        1.10 cm     LV e' lateral:   5.73 cm/s LVOT diam:     2.00 cm     LV E/e' lateral: 9.2 LV SV:         51 LV SV Index:   39 LVOT Area:     3.14 cm  LV Volumes (MOD) LV vol d, MOD A2C: 43.9 ml LV vol d, MOD A4C: 45.0 ml LV vol s, MOD A2C: 18.3 ml LV vol s, MOD A4C: 19.7 ml LV SV MOD A2C:     25.6 ml LV SV MOD A4C:     45.0 ml LV SV MOD BP:      27.0 ml RIGHT VENTRICLE TAPSE (M-mode): 2.3 cm LEFT ATRIUM             Index        RIGHT ATRIUM          Index LA diam:  3.00 cm 2.31 cm/m   RA Area:     9.20 cm LA Vol (A2C):   32.7 ml 25.14 ml/m  RA Volume:   18.00 ml 13.84 ml/m LA Vol (A4C):   24.2 ml 18.61 ml/m LA Biplane Vol: 30.1 ml 23.14 ml/m  AORTIC VALVE LVOT Vmax:   91.67 cm/s LVOT Vmean:  54.633 cm/s LVOT VTI:    0.162 m  AORTA Ao Root diam: 3.00 cm Ao Asc diam:  2.90 cm MITRAL VALVE               TRICUSPID VALVE MV Area (PHT): 1.94 cm    TR Peak grad:   17.5 mmHg MV Decel Time: 391 msec    TR Vmax:        209.00 cm/s MV E velocity: 52.70 cm/s MV A velocity: 64.30 cm/s  SHUNTS MV E/A ratio:  0.82        Systemic VTI:  0.16 m                            Systemic Diam: 2.00 cm Skeet Latch MD Electronically signed by Skeet Latch MD Signature Date/Time: 10/03/2021/3:25:51 PM    Final     PHYSICAL EXAM  Physical Exam  Constitutional: Appears frail pleasant elderly Caucasian lady Cardiovascular: Normal rate and regular rhythm.  Respiratory: Effort normal, non-labored breathing  Neuro: Mental Status: Patient is awake, alert, oriented to person, place, month, year, and situation. Patient is able to give a clear and coherent history. No signs of aphasia or neglect Cranial Nerves: III,IV, VI: EOMI without ptosis or diploplia.  V: Facial sensation is symmetric to light touch VII: Facial movement is symmetric resting and smiling VIII: Hearing is intact to voice XI: Shoulder shrug is symmetric. XII: Tongue protrudes midline without atrophy or fasciculations.  Motor: Tone is normal. Bulk is normal. 5/5 strength was present in all four extremities.  Sensory: Sensation is symmetric to light touch in the arms and legs.  Cerebellar: FNF with dysmetria on R. HKS with ataxia on R.    ASSESSMENT/PLAN Ms. ELLYSA LENIUS is a 76 y.o. female with history of hypertension, hyperlipidemia, hypothyroidism, ongoing tobacco abuse (perhaps 1 pack/day), COPD, prior gastrectomy (2016), degenerative disc disease of the C-spine with chronic C1 fracture, chronic  pain on chronic opiates who presented to ED with persistent dizziness. CTA demonstrated right PICA stroke for which neurology was consulted.   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 CTA head & neck right V3 occlusion with V4 reconstitution but stenosis, right PICA occlusion, left V2 occlusion with reconstitution, left P2 stenosis MRI  large right PICA infarct in the bilateral small old PICA infarct LDL 129 HgbA1c 5.3 2D echo EF 65 to 70% VTE prophylaxis - Lovenox No antithrombotic prior to admission, now on aspirin 325 mg daily and clopidogrel 75 mg daily for 3 months then ASA monotherapy given intracranial large vessel disease. Recommend CT head if neuro changes AND at time of discharge due to increased risk of reactive cerebral edema with cerebellar infarct  Therapy recommendations: CIR Disposition:  pending  Hyperthyroidism TSH < 0.01 Repeat TSH <0.01. Free T4 1.04 Likely the cause of her severe HTN  Hypertensive urgency Home meds:  amlodipine 5mg , restarted today  Unstable Gradually normalize in 3-5 days On amlodipine 5->10mg  and hydralazine 10->25  Q8h Started on carvedilol Long-term BP goal normotensive  Hyperlipidemia Home meds:  none LDL 129, goal < 70 Add crestor 40mg   Continue statin at discharge  Tobacco abuse Current smoker Smoking cessation counseling provided Pt is willing to quit  Other Stroke Risk Factors Advanced Age >/= 74   Other Active Problems COPD mixed type  Followed by Dr. Annamaria Boots  Continued on ICS-LAMA-LABA Chronic pain  Hospital day # 1  I have personally obtained history,examined this patient, reviewed notes, independently viewed imaging studies, participated in medical decision making and plan of care.ROS completed by me personally and pertinent positives fully documented   I have made any additions or clarifications directly to the above note. Agree with note above.  Patient is neurologically stable but continues to have right hemiataxia.  Continue ongoing physical ,occupational therapy and rehab consult.  Continue aspirin and Plavix for 3 months followed by aspirin alone and aggressive risk factor modification. Dr Philippa Chester to address hyperthyroidism. Long discussion with patient and her 2 daughters at the bedside and answered questions.  Greater than 50% time during this 50-minute visit was spent on counseling and coordination of care about her stroke and discussion about evaluation, treatment, prevention and answering questions.  Discussed with Dr. Madelin Rear, MD Medical Director [place] Pager: [phone] 10/04/2021 2:17 PM    To contact Stroke Continuity provider, please refer to [email or web address] After hours, contact General Neurology

## 2021-10-05 ENCOUNTER — Inpatient Hospital Stay (HOSPITAL_COMMUNITY): Payer: Medicare Other

## 2021-10-05 DIAGNOSIS — I639 Cerebral infarction, unspecified: Secondary | ICD-10-CM | POA: Diagnosis not present

## 2021-10-05 DIAGNOSIS — I160 Hypertensive urgency: Secondary | ICD-10-CM | POA: Diagnosis not present

## 2021-10-05 DIAGNOSIS — E0590 Thyrotoxicosis, unspecified without thyrotoxic crisis or storm: Secondary | ICD-10-CM | POA: Diagnosis not present

## 2021-10-05 LAB — CBC WITH DIFFERENTIAL/PLATELET
Abs Immature Granulocytes: 0.06 10*3/uL (ref 0.00–0.07)
Basophils Absolute: 0 10*3/uL (ref 0.0–0.1)
Basophils Relative: 0 %
Eosinophils Absolute: 0 10*3/uL (ref 0.0–0.5)
Eosinophils Relative: 0 %
HCT: 46.7 % — ABNORMAL HIGH (ref 36.0–46.0)
Hemoglobin: 16.3 g/dL — ABNORMAL HIGH (ref 12.0–15.0)
Immature Granulocytes: 0 %
Lymphocytes Relative: 4 %
Lymphs Abs: 0.6 10*3/uL — ABNORMAL LOW (ref 0.7–4.0)
MCH: 31.8 pg (ref 26.0–34.0)
MCHC: 34.9 g/dL (ref 30.0–36.0)
MCV: 91.2 fL (ref 80.0–100.0)
Monocytes Absolute: 1.1 10*3/uL — ABNORMAL HIGH (ref 0.1–1.0)
Monocytes Relative: 8 %
Neutro Abs: 12.3 10*3/uL — ABNORMAL HIGH (ref 1.7–7.7)
Neutrophils Relative %: 88 %
Platelets: 221 10*3/uL (ref 150–400)
RBC: 5.12 MIL/uL — ABNORMAL HIGH (ref 3.87–5.11)
RDW: 12.2 % (ref 11.5–15.5)
WBC: 14.1 10*3/uL — ABNORMAL HIGH (ref 4.0–10.5)
nRBC: 0 % (ref 0.0–0.2)

## 2021-10-05 LAB — COMPREHENSIVE METABOLIC PANEL
ALT: 28 U/L (ref 0–44)
AST: 29 U/L (ref 15–41)
Albumin: 4 g/dL (ref 3.5–5.0)
Alkaline Phosphatase: 118 U/L (ref 38–126)
Anion gap: 10 (ref 5–15)
BUN: 27 mg/dL — ABNORMAL HIGH (ref 8–23)
CO2: 26 mmol/L (ref 22–32)
Calcium: 9.3 mg/dL (ref 8.9–10.3)
Chloride: 98 mmol/L (ref 98–111)
Creatinine, Ser: 0.51 mg/dL (ref 0.44–1.00)
GFR, Estimated: >60 mL/min (ref >60)
Glucose, Bld: 124 mg/dL — ABNORMAL HIGH (ref 70–99)
Potassium: 3.1 mmol/L — ABNORMAL LOW (ref 3.5–5.1)
Sodium: 134 mmol/L — ABNORMAL LOW (ref 135–145)
Total Bilirubin: 0.4 mg/dL (ref 0.3–1.2)
Total Protein: 6.5 g/dL (ref 6.5–8.1)

## 2021-10-05 LAB — T3, FREE: T3, Free: 2.1 pg/mL (ref 2.0–4.4)

## 2021-10-05 LAB — MAGNESIUM: Magnesium: 2.2 mg/dL (ref 1.7–2.4)

## 2021-10-05 MED ORDER — CHLORHEXIDINE GLUCONATE CLOTH 2 % EX PADS
6.0000 | MEDICATED_PAD | Freq: Every day | CUTANEOUS | Status: DC
  Administered 2021-10-06 – 2021-10-07 (×2): 6 via TOPICAL

## 2021-10-05 MED ORDER — TRAMADOL HCL 50 MG PO TABS
50.0000 mg | ORAL_TABLET | Freq: Four times a day (QID) | ORAL | Status: DC | PRN
  Administered 2021-10-05: 50 mg via ORAL
  Filled 2021-10-05: qty 1

## 2021-10-05 MED ORDER — HYDRALAZINE HCL 20 MG/ML IJ SOLN
10.0000 mg | INTRAMUSCULAR | Status: DC | PRN
  Administered 2021-10-06 (×3): 10 mg via INTRAVENOUS
  Filled 2021-10-05 (×3): qty 1

## 2021-10-05 MED ORDER — SODIUM CHLORIDE 3 % IV BOLUS
250.0000 mL | Freq: Once | INTRAVENOUS | Status: AC
  Administered 2021-10-06: 250 mL via INTRAVENOUS

## 2021-10-05 MED ORDER — LABETALOL HCL 5 MG/ML IV SOLN
5.0000 mg | INTRAVENOUS | Status: DC | PRN
  Administered 2021-10-06 (×3): 5 mg via INTRAVENOUS
  Filled 2021-10-05 (×2): qty 4

## 2021-10-05 MED ORDER — FENTANYL CITRATE PF 50 MCG/ML IJ SOSY
12.5000 ug | PREFILLED_SYRINGE | INTRAMUSCULAR | Status: DC | PRN
  Administered 2021-10-05 – 2021-10-07 (×8): 12.5 ug via INTRAVENOUS
  Filled 2021-10-05 (×9): qty 1

## 2021-10-05 MED ORDER — SODIUM CHLORIDE 3 % IV SOLN
INTRAVENOUS | Status: DC
  Filled 2021-10-05 (×2): qty 500

## 2021-10-05 MED ORDER — FENTANYL CITRATE PF 50 MCG/ML IJ SOSY: 12.5000 ug | PREFILLED_SYRINGE | INTRAMUSCULAR | Status: DC

## 2021-10-05 MED ORDER — ONDANSETRON HCL 4 MG/2ML IJ SOLN: 4.0000 mg | Freq: Three times a day (TID) | INTRAMUSCULAR | Status: DC | PRN

## 2021-10-05 MED ORDER — LABETALOL HCL 5 MG/ML IV SOLN
10.0000 mg | INTRAVENOUS | Status: DC | PRN
Start: 2021-10-05 — End: 2021-10-05

## 2021-10-05 MED ORDER — POTASSIUM CHLORIDE 20 MEQ PO PACK
40.0000 meq | PACK | Freq: Two times a day (BID) | ORAL | Status: AC
  Administered 2021-10-05 (×2): 40 meq via ORAL
  Filled 2021-10-05 (×2): qty 2

## 2021-10-05 MED ORDER — CYANOCOBALAMIN 1000 MCG/ML IJ SOLN
1000.0000 ug | Freq: Once | INTRAMUSCULAR | Status: AC
Start: 2021-10-05 — End: 2021-10-05
  Administered 2021-10-05: 1000 ug via SUBCUTANEOUS
  Filled 2021-10-05: qty 1

## 2021-10-05 NOTE — Progress Notes (Signed)
STROKE TEAM PROGRESS NOTE   INTERVAL HISTORY Afebrile.  Vital signs stable.  Neurological exam unchanged.  She continues to have right hemiataxia. Daughter is at bedside..she did not sleep well last night and had poor PO intake.  She had a brief episode of choking this morning but now looks comfortable Vitals:   10/05/21 0351 10/05/21 0812 10/05/21 0845 10/05/21 1244  BP: (!) 153/68 (!) 151/70  (!) 157/73  Pulse: 88 90  84  Resp: 17 17  16   Temp: 98.7 F (37.1 C) 97.8 F (36.6 C)  (!) 97.4 F (36.3 C)  TempSrc: Oral Oral  Oral  SpO2: 100% 95% 96% 100%   CBC:  Recent Labs  Lab 10/03/2021 2011 10/03/21 0358  WBC 9.0 8.2  NEUTROABS 8.1*  --   HGB 14.4 14.4  HCT 42.7 42.0  MCV 94.5 93.8  PLT 188 180   Basic Metabolic Panel:  Recent Labs  Lab 09/20/2021 2011 10/03/21 0358  NA 135 134*  K 3.5 3.3*  CL 101 99  CO2 24 23  GLUCOSE 168* 132*  BUN 10 11  CREATININE 0.46 0.47  CALCIUM 9.0 8.7*   Lipid Panel:  Recent Labs  Lab 10/03/21 0358  CHOL 231*  TRIG 46  HDL 93  CHOLHDL 2.5  VLDL 9  LDLCALC 129*   HgbA1c:  Recent Labs  Lab 10/03/21 0358  HGBA1C 5.3   Urine Drug Screen:  Recent Labs  Lab 10/03/21 0151  LABOPIA POSITIVE*  COCAINSCRNUR NONE DETECTED  LABBENZ NONE DETECTED  AMPHETMU NONE DETECTED  THCU NONE DETECTED  LABBARB POSITIVE*    Alcohol Level No results for input(s): "ETH" in the last 168 hours.  IMAGING past 24 hours No results found.  PHYSICAL EXAM  Physical Exam  Constitutional: Appears frail pleasant elderly Caucasian lady Cardiovascular: Normal rate and regular rhythm.  Respiratory: Effort normal, non-labored breathing  Neuro: Mental Status: Patient is awake, alert, oriented to person, place, month, year, and situation. Patient is able to give a clear and coherent history. No signs of aphasia or neglect Cranial Nerves: III,IV, VI: EOMI without ptosis or diploplia.  V: Facial sensation is symmetric to light touch VII: Facial  movement is symmetric resting and smiling VIII: Hearing is intact to voice XI: Shoulder shrug is symmetric. XII: Tongue protrudes midline without atrophy or fasciculations.  Motor: Tone is normal. Bulk is normal. 5/5 strength was present in all four extremities.  Sensory: Sensation is symmetric to light touch in the arms and legs.  Cerebellar: FNF with dysmetria on R. HKS with ataxia on R.    ASSESSMENT/PLAN [name] is a 76 y.o. female with history of hypertension, hyperlipidemia, hypothyroidism, ongoing tobacco abuse (perhaps 1 pack/day), COPD, prior gastrectomy (2016), degenerative disc disease of the C-spine with chronic C1 fracture, chronic pain on chronic opiates who presented to ED with persistent dizziness. CTA demonstrated right PICA stroke for which neurology was consulted.   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 CTA head & neck right V3 occlusion with V4 reconstitution but stenosis, right PICA occlusion, left V2 occlusion with reconstitution, left P2 stenosis MRI  large right PICA infarct in the bilateral small old PICA infarct LDL 129 HgbA1c 5.3 2D echo EF 65 to 70% VTE prophylaxis - Lovenox No antithrombotic prior to admission, now on aspirin 325 mg daily and clopidogrel 75 mg  daily for 3 months then ASA monotherapy given intracranial large vessel disease. Recommend CT head if neuro changes AND at time of discharge due to increased risk of reactive cerebral edema with cerebellar infarct  Therapy recommendations: CIR Disposition:  pending  Hyperthyroidism TSH < 0.01 Repeat TSH <0.01. Free T4 1.04 Likely the cause of her severe HTN  Hypertensive urgency Home meds:  amlodipine 5mg , restarted today  Unstable Gradually normalize in 3-5 days On amlodipine 5->10mg  and hydralazine 10->25  Q8h Started on carvedilol Long-term BP goal normotensive  Hyperlipidemia Home meds:  none LDL 129, goal < 70 Add crestor 40mg   Continue statin at discharge  Tobacco abuse Current smoker Smoking cessation counseling provided Pt is willing to quit  Other Stroke Risk Factors Advanced Age >/= 46   Other Active Problems COPD mixed type  Followed by Dr.  Continued on ICS-LAMA-LABA Chronic pain  Hospital day # 2  Patient is neurologically stable but continues to have right hemiataxia.  Continue ongoing physical ,occupational therapy and rehab consult.  Continue aspirin and Plavix for 3 months followed by aspirin alone and aggressive risk factor modification. Dr 76 to address hyperthyroidism. Long discussion with patient and her daughter at the bedside and answered questions.  Greater than 50% time during this 35-minute visit was spent on counseling and coordination of care about her stroke and discussion about evaluation, treatment, prevention and answering questions.  Discussed with Dr. Maple Hudson.  Stroke team will sign off.  Follow-up as an outpatient stroke clinic in 2 months R[facility]on Eisenmenger, MD Medical Director [facility] Pager: [phone] 10/05/2021 12:49 PM    To contact Stroke Continuity provider, please refer to [phone]. After hours, contact General Neurology

## 2021-10-05 NOTE — Progress Notes (Addendum)
HOSPITAL MEDICINE OVERNIGHT EVENT NOTE    Notified by nursing patient started experiencing slurred speech and "gurgling" earlier in the evening.  Dr. Iver Nestle was additionally notified by nursing who ordered a STAT head CT revealing significant progression of cytotoxic edema in the right cerebellar hemisphere with extension into the left hemisphere as well with near effacement of the 4th ventricle.  Both Dr. Iver Nestle and myself went to evaluate the patient at the bedside.  Patient continues to exhibit significant ataxia and slurred speech with right facial droop.  Patient is quite lethargic and complains of ongoing headache.    Daughter Lelon Mast at the bedside was updated on results of CT imaging and was also informed as to the poor potential prognosis with high risk of herniation and airway compromise.  Daughter states that she will speak to the rest of the siblings including her sister Natasha Bence who has health care POA concerning code status and goals of care.   Dr. Iver Nestle furthermore discussed case with neurosurgery who feels the patient is not a candidate for any surgical intervention.  She now recommends that the patient be transferred to the ICU for hypertonic saline and close monitoring with the potential for need of intubation and mechanical ventilation if airway becomes compromised.  After discussion between neurology and PCCM, the decision has been made for the patient to go to the neurology service while the patient is in the intensive care unit with potential need for PCCM consultation if patient requires intubation.    In the meantime, patient will remain NPO.  Transfer order has been placed and bed availability on 4 [address]. has been confirmed with the [facility].  Dr. Rollene Fare prompt and thorough care of this patient is appreciated.  [facility] will assume care of the patient once patient is discharged from the intensive care unit back to the medical floor.  Marinda Elk  MD [facility]

## 2021-10-05 NOTE — Progress Notes (Addendum)
Inpatient Rehab Admissions Coordinator:  Spoke with pt's daughter Lelon Mast. Explained CIR goals and expectations. She acknowledged understanding. She is unable to provide 24/7 support for pt at this time. She would prefer a SNF rehab for pt instead of CIR.  TOC and Dr. David Stall made aware.    ADDENDUM 1442: Samantha called AC. She now would like pt to purse CIR. She is going to discuss with her sister if they would be able to provide 24/7 support after discharge.  ADDENDUM 1422: Pt's daughter Darl Pikes left a message with AC at [phone]. AC returned call. Answered her questions about CIR goals and expectations. Reviewed insurance coverage. She informed AC that she, her sister Lelon Mast, and brother will be able to provide 24/7 support for pt after discharge. MD and TOC updated.  Wolfgang Phoenix, MS, CCC-SLP Admissions Coordinator [phone]

## 2021-10-05 NOTE — Progress Notes (Addendum)
Neurology Progress Note  Patient ID: [name] is a 76 y.o. with PMHx of  has a past medical history of Acute bronchitis, Anemia, Arthritis, Chronic rhinitis, COPD (chronic obstructive pulmonary disease) (HCC), GERD (gastroesophageal reflux disease), Headache, Hyperthyroidism (2012), Hypothyroidism, Thyroid disease, and Tobacco use disorder.   Major interval events/subjective:  -Reports worsening headache, worsening slurred speech, worsening dysphagia, double vision of uncertain time course  Exam: Vitals:   10/05/21 1931 10/05/21 2204  BP: (!) 191/99 (!) 161/66  Pulse: 87 89  Resp:    Temp: 97.6 F (36.4 C)   SpO2: 97% 98%   Gen: In bed, comfortable  Resp: non-labored breathing, no grossly audible wheezing Cardiac: Perfusing extremities well  Abd: soft, nt  Neuro: MS: Sleepy, opens eyes to voice, follows simple commands, oriented to age, place, situation, month and year CN: Visual fields full, direction changing nystagmus but denies double vision at this time, pupils round and reactive to light but the left is 1 mm larger than the right, face with a right facial droop, symmetric sensation, tongue midline Motor: Too weak/tired to participate in confrontational strength testing.  Ataxic in all 4 extremities out of proportion to weakness, worse on the right than the left Sensory: Intact to light touch throughout  Pertinent Labs:  Basic Metabolic Panel: Recent Labs  Lab 31-Oct-2021 2011 10/03/21 0358  NA 135 134*  K 3.5 3.3*  CL 101 99  CO2 24 23  GLUCOSE 168* 132*  BUN 10 11  CREATININE 0.46 0.47  CALCIUM 9.0 8.7*    CBC: Recent Labs  Lab 31-Oct-2021 2011 10/03/21 0358  WBC 9.0 8.2  NEUTROABS 8.1*  --   HGB 14.4 14.4  HCT 42.7 42.0  MCV 94.5 93.8  PLT 188 180    Coagulation Studies: No results for input(s): "LABPROT", "INR" in the last 72 hours.    Head CT personally reviewed, agree with radiology:  1. Marked worsening of cytotoxic edema in the right  cerebellar hemisphere with near effacement of the fourth ventricle. No hydrocephalus, though there is high likelihood of noncommunicating hydrocephalus developing. 2. No acute hemorrhage.  Impression: Worsening cytotoxic edema now affecting the right cerebellar hemisphere. Discussed with neurosurgery Dr. Conchita Paris who noted patient would not be a surgical candidate due to age and comorbidities.  Long discussion with daughter at bedside, she is unable to reach her siblings.  She notes that in their discussions today that the patient would not want to live with significant neurological impairment such as being bedbound and requiring total care.  We will attempt to stabilize her with hypertonic saline overnight.  She will remain full CODE STATUS if she declines to the point of needing intubation, but will get palliative care on board given high likelihood of further decline  Recommendations:  #Right PICA stroke with cytotoxic edema -Hypertonic saline -CBC, CMP, magnesium -Every hour neurochecks -Current blood pressure goal systolic blood pressure 120-160/60-90 -Stroke team primary given risk of worsening cytotoxic edema peaking in the next 1 to 3 days -Palliative care consult -Continue full CODE STATUS -N.p.o. due to worsening dysphagia -Appreciate SLP reevaluation  #Chronic pain on chronic opiates -Fentanyl 12.5 mcg every 4 hours as needed severe pain to avoid withdrawal but minimize risk of clouding neurological evaluation  #Goals of care Patient confirms  that her daughters Lelon Mast and Darl Pikes would make decisions for her if she becomes incapable of making her own decisions.  There is also an adult son Bing Neighbors MD-PhD [facility]  [phone]    

## 2021-10-05 NOTE — Progress Notes (Signed)
[facility] PROGRESS NOTE    Progress Note  [name]  [id number] DOB: 19-Oct-1945 DOA: 09/20/2021 PCP: Sheila Low, MD     Brief Narrative:   [name] is an 76 y.o. female past medical history significant for chronic pain, COPD hypothyroidism tobacco abuse comes into the emergency room complaining of dizziness nausea and vomiting.  He relates the room is spinning around him CTA of the head and neck showed a acute/subacute infarct of the right PICA no hemorrhage or mass effect and multiple other occlusions of the left vertebral artery at the V2 segment, severe stenosis of the left PCA P2 segment.  MRI confirmed multifocal acute/subacute infarct in the right cerebellum  Assessment/Plan:   Acute ischemic stroke [name]): HgbA1c 5.3, fasting lipid panel  HDL > 40, LDL > 120 MRI, acute CVA to the cerebellum. Physical therapy evaluated the patient, will need inpatient rehab. Was previously on no antiplatelet therapy.  Continue ASA 81mg  daily and plavix 75mg  daily, then she will continue aspirin daily. High-dose statins. BP goal: permissive HTN upto 220/120 mmHg No events on telemetry.  There is on board and recommended CT of the head if any neuro changes due to increased risk of reactive cerebral edema with cerebellar infarcts. Awaiting inpatient rehab admission.  Essential hypertension: Allow permissive hypertension in the setting of acute CVA.   Blood pressures well controlled continue Beard-dose Coreg.  COPD with emphysema ([place]) Stable continue inhalers.  Hypothyroidism: Hold Synthroid her TSH was undetectable, will have to resume Synthroid at a lower dose and will have to have repeated TSH and free T4 as an outpatient in 6 weeks.  Chronic pain syndrome She relates the medication is not lasting long enough since she has been on this bed. Change her to oxy orally. Started on MiraLAX p.o. twice daily.  DVT prophylaxis: lovenox Family  Communication:daughter Status is: Observation The patient will require care spanning > 2 midnights and should be moved to inpatient because: Acute CVA    Code Status:     Code Status Orders  (From admission, onward)           Start     Ordered   10/03/21 0330  Full code  Continuous        10/03/21 0331           Code Status History     Date Active Date Inactive Code Status Order ID Comments User Context   09/25/2014 1716 10/03/2014 1457 Full Code [id number]  Excell Seltzer, MD Inpatient         IV Access:   Peripheral IV   Procedures and diagnostic studies:   ECHOCARDIOGRAM COMPLETE  Result Date: 10/03/2021    ECHOCARDIOGRAM REPORT   Patient Name:   [name] Date of Exam: 10/03/2021 Medical Rec #:  [id number]       Height:       62.0 in Accession #:    [id number]      Weight:       80.8 lb Date of Birth:  1946-01-27       BSA:          1.301 m Patient Age:    72 years        BP:           168/67 mmHg Patient Gender: F               HR:           93 bpm. Exam Location:  Inpatient Procedure: 2D Echo, Cardiac Doppler and Color Doppler Indications:    Stroke I63.9  History:        Patient has no prior history of Echocardiogram examinations.                 COPD; Risk Factors:Current Smoker and GERD.  Sonographer:    Bernadene Person RDCS Referring Phys: [id number] [name]  1. Left ventricular ejection fraction, by estimation, is 65 to 70%. The left ventricle has normal function. The left ventricle has no regional wall motion abnormalities. There is mild asymmetric left ventricular hypertrophy of the basal-septal segment. Left ventricular diastolic parameters are consistent with Grade I diastolic dysfunction (impaired relaxation).  2. Right ventricular systolic function is normal. The right ventricular size is normal. There is normal pulmonary artery systolic pressure.  3. The mitral valve is normal in structure. Mild mitral valve regurgitation. No evidence  of mitral stenosis.  4. The aortic valve is tricuspid. Aortic valve regurgitation is not visualized. No aortic stenosis is present.  5. The inferior vena cava is normal in size with greater than 50% respiratory variability, suggesting right atrial pressure of 3 mmHg. FINDINGS  Left Ventricle: Left ventricular ejection fraction, by estimation, is 65 to 70%. The left ventricle has normal function. The left ventricle has no regional wall motion abnormalities. The left ventricular internal cavity size was normal in size. There is  mild asymmetric left ventricular hypertrophy of the basal-septal segment. Left ventricular diastolic parameters are consistent with Grade I diastolic dysfunction (impaired relaxation). Normal left ventricular filling pressure. Right Ventricle: The right ventricular size is normal. No increase in right ventricular wall thickness. Right ventricular systolic function is normal. There is normal pulmonary artery systolic pressure. The tricuspid regurgitant velocity is 2.09 m/s, and  with an assumed right atrial pressure of 3 mmHg, the estimated right ventricular systolic pressure is 123XX123 mmHg. Left Atrium: Left atrial size was normal in size. Right Atrium: Right atrial size was normal in size. Pericardium: There is no evidence of pericardial effusion. Mitral Valve: The mitral valve is normal in structure. Mild mitral valve regurgitation. No evidence of mitral valve stenosis. Tricuspid Valve: The tricuspid valve is normal in structure. Tricuspid valve regurgitation is trivial. No evidence of tricuspid stenosis. Aortic Valve: The aortic valve is tricuspid. Aortic valve regurgitation is not visualized. No aortic stenosis is present. Pulmonic Valve: The pulmonic valve was normal in structure. Pulmonic valve regurgitation is not visualized. No evidence of pulmonic stenosis. Aorta: The aortic root is normal in size and structure. Venous: The inferior vena cava is normal in size with greater than 50%  respiratory variability, suggesting right atrial pressure of 3 mmHg. IAS/Shunts: No atrial level shunt detected by color flow Doppler.  LEFT VENTRICLE PLAX 2D LVIDd:         3.40 cm     Diastology LVIDs:         2.60 cm     LV e' medial:    5.54 cm/s LV PW:         0.90 cm     LV E/e' medial:  9.5 LV IVS:        1.10 cm     LV e' lateral:   5.73 cm/s LVOT diam:     2.00 cm     LV E/e' lateral: 9.2 LV SV:         51 LV SV Index:   39 LVOT Area:     3.14 cm  LV  Volumes (MOD) LV vol d, MOD A2C: 43.9 ml LV vol d, MOD A4C: 45.0 ml LV vol s, MOD A2C: 18.3 ml LV vol s, MOD A4C: 19.7 ml LV SV MOD A2C:     25.6 ml LV SV MOD A4C:     45.0 ml LV SV MOD BP:      27.0 ml RIGHT VENTRICLE TAPSE (M-mode): 2.3 cm LEFT ATRIUM             Index        RIGHT ATRIUM          Index LA diam:        3.00 cm 2.31 cm/m   RA Area:     9.20 cm LA Vol (A2C):   32.7 ml 25.14 ml/m  RA Volume:   18.00 ml 13.84 ml/m LA Vol (A4C):   24.2 ml 18.61 ml/m LA Biplane Vol: 30.1 ml 23.14 ml/m  AORTIC VALVE LVOT Vmax:   91.67 cm/s LVOT Vmean:  54.633 cm/s LVOT VTI:    0.162 m  AORTA Ao Root diam: 3.00 cm Ao Asc diam:  2.90 cm MITRAL VALVE               TRICUSPID VALVE MV Area (PHT): 1.94 cm    TR Peak grad:   17.5 mmHg MV Decel Time: 391 msec    TR Vmax:        209.00 cm/s MV E velocity: 52.70 cm/s MV A velocity: 64.30 cm/s  SHUNTS MV E/A ratio:  0.82        Systemic VTI:  0.16 m                            Systemic Diam: 2.00 cm Chilton Si MD Electronically signed by Chilton Si MD Signature Date/Time: 10/03/2021/3:25:51 PM    Final      Medical Consultants:   None.   Subjective:    [name] relates she is hungry had an episode of choking this morning.  Objective:    Vitals:   10/05/21 0114 10/05/21 0351 10/05/21 0812 10/05/21 0845  BP: 139/70 (!) 153/68 (!) 151/70   Pulse:  88 90   Resp:  17 17   Temp:  98.7 F (37.1 C) 97.8 F (36.6 C)   TempSrc:  Oral Oral   SpO2:  100% 95% 96%   SpO2: 96  %   Intake/Output Summary (Last 24 hours) at 10/05/2021 0956 Last data filed at 10/05/2021 5621 Gross per 24 hour  Intake 856.84 ml  Output --  Net 856.84 ml   There were no vitals filed for this visit.  Exam: General exam: In no acute distress. Respiratory system: Good air movement and clear to auscultation. Cardiovascular system: S1 & S2 heard, RRR. No JVD. Gastrointestinal system: Abdomen is nondistended, soft and nontender.  Extremities: No pedal edema. Skin: No rashes, lesions or ulcers Data Reviewed:    Labs: Basic Metabolic Panel: Recent Labs  Lab 09/11/2021 2011 10/03/21 0358  NA 135 134*  K 3.5 3.3*  CL 101 99  CO2 24 23  GLUCOSE 168* 132*  BUN 10 11  CREATININE 0.46 0.47  CALCIUM 9.0 8.7*    GFR CrCl cannot be calculated (Unknown ideal weight.). Liver Function Tests: Recent Labs  Lab 09/27/2021 2011 10/03/21 0358  AST 25 24  ALT 24 25  ALKPHOS 115 126  BILITOT 0.5 0.6  PROT 6.5 6.4*  ALBUMIN 4.1 4.0  Recent Labs  Lab 10-16-21 2011  LIPASE 21    No results for input(s): "AMMONIA" in the last 168 hours. Coagulation profile No results for input(s): "INR", "PROTIME" in the last 168 hours. COVID-19 Labs  No results for input(s): "DDIMER", "FERRITIN", "LDH", "CRP" in the last 72 hours.  Lab Results  Component Value Date   SARSCOV2NAA NEGATIVE 2021/10/16    CBC: Recent Labs  Lab 10/16/21 2011 10/03/21 0358  WBC 9.0 8.2  NEUTROABS 8.1*  --   HGB 14.4 14.4  HCT 42.7 42.0  MCV 94.5 93.8  PLT 188 180    Cardiac Enzymes: No results for input(s): "CKTOTAL", "CKMB", "CKMBINDEX", "TROPONINI" in the last 168 hours. BNP (last 3 results) No results for input(s): "PROBNP" in the last 8760 hours. CBG: No results for input(s): "GLUCAP" in the last 168 hours. D-Dimer: No results for input(s): "DDIMER" in the last 72 hours. Hgb A1c: Recent Labs    10/03/21 0358  HGBA1C 5.3    Lipid Profile: Recent Labs    10/03/21 0358  CHOL 231*   HDL 93  LDLCALC 129*  TRIG 46  CHOLHDL 2.5    Thyroid function studies: Recent Labs    10/04/21 0349  TSH <0.010*    Anemia work up: Recent Labs    10/03/21 0358  VITAMINB12 274    Sepsis Labs: Recent Labs  Lab 2021/10/16 2011 10/03/21 0358  WBC 9.0 8.2    Microbiology Recent Results (from the past 240 hour(s))  Resp Panel by RT-PCR (Flu A&B, Covid) Anterior Nasal Swab     Status: None   Collection Time: 10/16/2021  8:01 PM   Specimen: Anterior Nasal Swab  Result Value Ref Range Status   SARS Coronavirus 2 by RT PCR NEGATIVE NEGATIVE Final    Comment: (NOTE) SARS-CoV-2 target nucleic acids are NOT DETECTED.  The SARS-CoV-2 RNA is generally detectable in upper respiratory specimens during the acute phase of infection. The lowest concentration of SARS-CoV-2 viral copies this assay can detect is 138 copies/mL. A negative result does not preclude SARS-Cov-2 infection and should not be used as the sole basis for treatment or other patient management decisions. A negative result may occur with  improper specimen collection/handling, submission of specimen other than nasopharyngeal swab, presence of viral mutation(s) within the areas targeted by this assay, and inadequate number of viral copies(<138 copies/mL). A negative result must be combined with clinical observations, patient history, and epidemiological information. The expected result is Negative.  Fact Sheet for Patients:  BloggerCourse.com  Fact Sheet for Healthcare Providers:  SeriousBroker.it  This test is no t yet approved or cleared by the [place] FDA and  has been authorized for detection and/or diagnosis of SARS-CoV-2 by FDA under an Emergency Use Authorization (EUA). This EUA will remain  in effect (meaning this test can be used) for the duration of the COVID-19 declaration under Section 564(b)(1) of the Act, 21 U.S.C.section 360bbb-3(b)(1),  unless the authorization is terminated  or revoked sooner.       Influenza A by PCR NEGATIVE NEGATIVE Final   Influenza B by PCR NEGATIVE NEGATIVE Final    Comment: (NOTE) The Xpert Xpress SARS-CoV-2/FLU/RSV plus assay is intended as an aid in the diagnosis of influenza from Nasopharyngeal swab specimens and should not be used as a sole basis for treatment. Nasal washings and aspirates are unacceptable for Xpert Xpress SARS-CoV-2/FLU/RSV testing.  Fact Sheet for Patients: BloggerCourse.com  Fact Sheet for Healthcare Providers: SeriousBroker.it  This test is not yet  approved or cleared by the [place] and has been authorized for detection and/or diagnosis of SARS-CoV-2 by FDA under an Emergency Use Authorization (EUA). This EUA will remain in effect (meaning this test can be used) for the duration of the COVID-19 declaration under Section 564(b)(1) of the Act, 21 U.S.C. section 360bbb-3(b)(1), unless the authorization is terminated or revoked.  Performed at [facility], [place] [address] [place], [place] [address]      Medications:    amLODipine  10 mg Oral Daily   aspirin EC  81 mg Oral Daily   carvedilol  3.125 mg Oral BID WC   clopidogrel  75 mg Oral Daily   enoxaparin (LOVENOX) injection  30 mg Subcutaneous Daily   fluticasone furoate-vilanterol  1 puff Inhalation Daily   And   umeclidinium bromide  1 puff Inhalation Daily   hydrALAZINE  25 mg Oral Q8H   melatonin  3 mg Oral QHS   nicotine  21 mg Transdermal Daily   rosuvastatin  40 mg Oral Daily   Continuous Infusions:  sodium chloride 75 mL/hr at 10/05/21 0345      LOS: 2 days   Charlynne Cousins  [facility]  10/05/2021, 9:56 AM

## 2021-10-05 NOTE — Plan of Care (Signed)

## 2021-10-06 DIAGNOSIS — I639 Cerebral infarction, unspecified: Secondary | ICD-10-CM | POA: Diagnosis not present

## 2021-10-06 LAB — METHYLMALONIC ACID, SERUM: Methylmalonic Acid, Quantitative: 227 nmol/L (ref 0–378)

## 2021-10-06 LAB — SODIUM
Sodium: 140 mmol/L (ref 135–145)
Sodium: 146 mmol/L — ABNORMAL HIGH (ref 135–145)

## 2021-10-06 LAB — MRSA NEXT GEN BY PCR, NASAL: MRSA by PCR Next Gen: NOT DETECTED

## 2021-10-06 MED ORDER — SODIUM CHLORIDE 3 % IV BOLUS
250.0000 mL | Freq: Once | INTRAVENOUS | Status: AC
  Administered 2021-10-06: 250 mL via INTRAVENOUS

## 2021-10-06 MED ORDER — ORAL CARE MOUTH RINSE: 15.0000 mL | OROMUCOSAL | Status: DC | PRN

## 2021-10-06 MED ORDER — HEPARIN SODIUM (PORCINE) 5000 UNIT/ML IJ SOLN
5000.0000 [IU] | Freq: Two times a day (BID) | INTRAMUSCULAR | Status: DC
  Administered 2021-10-06 – 2021-10-07 (×2): 5000 [IU] via SUBCUTANEOUS
  Filled 2021-10-06 (×3): qty 1

## 2021-10-06 MED ORDER — HYALURONIDASE HUMAN 150 UNIT/ML IJ SOLN
150.0000 [IU] | Freq: Once | INTRAMUSCULAR | Status: DC | PRN
Start: 2021-10-06 — End: 2021-10-07

## 2021-10-06 MED ORDER — POTASSIUM CHLORIDE 10 MEQ/100ML IV SOLN
10.0000 meq | INTRAVENOUS | Status: AC
  Administered 2021-10-06 (×4): 10 meq via INTRAVENOUS
  Filled 2021-10-06 (×4): qty 100

## 2021-10-06 MED ORDER — ASPIRIN 300 MG RE SUPP
300.0000 mg | Freq: Once | RECTAL | Status: AC
  Administered 2021-10-06: 300 mg via RECTAL
  Filled 2021-10-06: qty 1

## 2021-10-06 MED ORDER — ORAL CARE MOUTH RINSE
15.0000 mL | OROMUCOSAL | Status: DC
  Administered 2021-10-06 – 2021-10-07 (×7): 15 mL via OROMUCOSAL

## 2021-10-06 NOTE — Progress Notes (Signed)
Per the IV teams note, patient is receiving 3% continuously as well as a bolus, NOT vasopressors

## 2021-10-06 NOTE — Progress Notes (Signed)
At bedside to place USGPIV no appropriate vein found. Placed 20g PIV without [place] to right medial forearm. Primary RN aware and to place IWatch if needed.

## 2021-10-06 NOTE — Progress Notes (Signed)
Occupational Therapy Treatment Patient Details Name: [name] MRN: [id number] DOB: 13-May-1945 Today's Date: 10/06/2021   History of present illness Pt is a 76 y/o female admitted secondary to dizziness and nausea/vomiting. 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 Neuro event 8/27, CTH with significant progression of cytotoxic edema in R cerebellar hemisphere with extension into L hemisphere and near effacement of 4th ventricle. PMH includes COPD, HTN, and tobacco use.   OT comments  [name] was seen in the ICU post the above new neuro findings. Session completed with PT for safety. Overall pt was cognitively appropriate for simple tasks address this date, oriented, and followed simple commands. She continues to present with ataxic R hemibody and needs min A for bed mobility, transfers and short ambulation with RW. OT to continue to follow, POC remains appropriate.    Recommendations for follow up therapy are one component of a multi-disciplinary discharge planning process, led by the attending physician.  Recommendations may be updated based on patient status, additional functional criteria and insurance authorization.    Follow Up Recommendations  Acute inpatient rehab (3hours/day)    Assistance Recommended at Discharge Frequent or constant Supervision/Assistance  Patient can return home with the following  Two people to help with walking and/or transfers;Two people to help with bathing/dressing/bathroom;Assistance with cooking/housework;Direct supervision/assist for medications management;Help with stairs or ramp for entrance   Equipment Recommendations  Other (comment)    Recommendations for Other Services Rehab consult    Precautions / Restrictions Precautions Precautions: Fall Restrictions Weight Bearing Restrictions: No       Mobility Bed Mobility Overal bed mobility: Needs Assistance Bed Mobility: Sit to Supine     Supine to sit: Min assist Sit to supine: Min assist    General bed mobility comments: cue to get OOB, increased time    Transfers Overall transfer level: Needs assistance Equipment used: Rolling walker (2 wheels) Transfers: Sit to/from Stand, Bed to chair/wheelchair/BSC Sit to Stand: Min assist, +2 physical assistance, +2 safety/equipment                 Balance Overall balance assessment: Needs assistance Sitting-balance support: Feet supported, Bilateral upper extremity supported Sitting balance-Leahy Scale: Fair Sitting balance - Comments: min G for sitting balance, BUE supported on bed   Standing balance support: Bilateral upper extremity supported, During functional activity Standing balance-Leahy Scale: Poor                             ADL either performed or assessed with clinical judgement   ADL Overall ADL's : Needs assistance/impaired                         Toilet Transfer: Minimal assistance;+2 for physical assistance;+2 for safety/equipment;Ambulation;Rolling walker (2 wheels) Toilet Transfer Details (indicate cue type and reason): simulated transfer from bed, ambulated ~5 ft with RW         Functional mobility during ADLs: Minimal assistance;+2 for physical assistance;+2 for safety/equipment General ADL Comments: limited by generalized weakness, activity tolerance and R ataxia    Extremity/Trunk Assessment Upper Extremity Assessment Upper Extremity Assessment: Generalized weakness;RUE deficits/detail RUE Deficits / Details: ataxic, generally weak RUE Coordination: decreased fine motor;decreased gross motor   Lower Extremity Assessment Lower Extremity Assessment: Defer to PT evaluation        Vision   Vision Assessment?: Vision impaired- to be further tested in functional context   Perception Perception Perception: Not tested  Praxis Praxis Praxis: Not tested    Cognition Arousal/Alertness: Awake/alert Behavior During Therapy: WFL for tasks assessed/performed Overall  Cognitive Status: Impaired/Different from baseline Area of Impairment: Attention, Following commands, Memory, Safety/judgement, Awareness, Problem solving                   Current Attention Level: Sustained Memory: Decreased recall of precautions Following Commands: Follows one step commands consistently Safety/Judgement: Decreased awareness of safety, Decreased awareness of deficits Awareness: Emergent Problem Solving: Slow processing, Requires verbal cues General Comments: oriented, slow processing, flat affect, appropriate for tasks addressed this date        Exercises      Shoulder Instructions       General Comments VSS on RA, family present    Pertinent Vitals/ Pain       Pain Assessment Pain Assessment: Faces Faces Pain Scale: Hurts little more Pain Location: headache - generalized with movement Pain Descriptors / Indicators: Discomfort, Grimacing Pain Intervention(s): Limited activity within patient's tolerance, Monitored during session  Home Living                                          Prior Functioning/Environment              Frequency  Min 2X/week        Progress Toward Goals  OT Goals(current goals can now be found in the care plan section)  Progress towards OT goals: Progressing toward goals  Acute Rehab OT Goals Patient Stated Goal: to get some rest OT Goal Formulation: With patient Time For Goal Achievement: 10/17/21 Potential to Achieve Goals: Good  Plan Discharge plan remains appropriate    Co-evaluation    PT/OT/SLP Co-Evaluation/Treatment: Yes Reason for Co-Treatment: Complexity of the patient's impairments (multi-system involvement);For patient/therapist safety;To address functional/ADL transfers   OT goals addressed during session: ADL's and self-care      AM-PAC OT "6 Clicks" Daily Activity     Outcome Measure   Help from another person eating meals?: Total Help from another person taking care  of personal grooming?: A Little Help from another person toileting, which includes using toliet, bedpan, or urinal?: A Lot Help from another person bathing (including washing, rinsing, drying)?: A Lot Help from another person to put on and taking off regular upper body clothing?: A Little Help from another person to put on and taking off regular lower body clothing?: A Lot 6 Click Score: 13    End of Session Equipment Utilized During Treatment: Gait belt;Rolling walker (2 wheels)  OT Visit Diagnosis: Unsteadiness on feet (R26.81);Other abnormalities of gait and mobility (R26.89);Muscle weakness (generalized) (M62.81)   Activity Tolerance Patient tolerated treatment well   Patient Left in bed;with call bell/phone within reach;with family/visitor present   Nurse Communication Mobility status        Time: 7616-0737 OT Time Calculation (min): 23 min  Charges: OT General Charges $OT Visit: 1 Visit OT Treatments $Therapeutic Activity: 8-22 mins    Nira Visscher A Asiel Chrostowski 10/06/2021, 12:54 PM

## 2021-10-06 NOTE — Progress Notes (Signed)
IV team placed 20g PIV, after failed attempt of finding a suitable vein for ultrasound. Dr. Pearlean Brownie made aware of access challenges. He still wanted to proceed with infusing 3% through the PIV. IV watch in place and 3% gtt restarted. Will continue to monitor IV site and patients neurological status.

## 2021-10-06 NOTE — Progress Notes (Signed)
Speech Language Pathology Treatment: Dysphagia  Patient Details Name: [name] MRN: [id number] DOB: 1945-04-14 Today's Date: 10/06/2021 Time: 3419-6222 SLP Time Calculation (min) (ACUTE ONLY): 13 min  Assessment / Plan / Recommendation Clinical Impression  Pt presents with recent neurological change overnight. Family reports change in right side of face and new difficulty with swallowing. Pt speech intelligibility appeared decreased marked by low intensity and edentulous state. Pt has noted right-sided facial weakness, and reduced labial seal. Volitional and reflexive cough appeared weak and significantly congested. Pt given two trials of puree consistancy. Consistent with previous encounter pt displayed prolonged bolus formation and transit, and potential swallow initiation delay in both trials. However, new s/sx of aspiration noted were immediate and delayed cough, and wet vocal quality noted on both trials. Suction required to clear remaining bolus after second trial as pt experienced significant difficulty clearing throat, no other trials were attempted. Suspected oropharyngeal dysphagia second to recent neurological change (8/27). Pt and family educated on plan of care and diet status. Reccomend NPO diet, medications given via alternative means. Pt will likely require instrumental assessment and will continue to follow.   HPI HPI: 76 y.o. female with medical history significant for chronic pain, COPD, hypothyroidism, hypertension, and tobacco abuse, presenting to the emergency department with dizziness, nausea, and vomiting. MRI brain indicated multifocal acute/early subacute ischemia within the right cerebellum, predominantly located in the right PICA territory. No hemorrhage or mass effect. 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 Pt with worsening neurological presentation 8/27, repeat CT showed increased cytotoxic edema in the right cerebellar  hemisphere.      SLP Plan  Continue with current plan of care      Recommendations for follow up therapy are one component of a multi-disciplinary discharge planning process, led by the attending physician.  Recommendations may be updated based on patient status, additional functional criteria and insurance authorization.    Recommendations  Diet recommendations: NPO Medication Administration: Via alternative means                General recommendations: Rehab consult Oral Care Recommendations: Oral care QID Follow Up Recommendations: Skilled nursing-short term rehab (<3 hours/day) Assistance recommended at discharge: Intermittent Supervision/Assistance SLP Visit Diagnosis: Dysphagia, unspecified (R13.10) Attention and concentration deficit following: Cerebral infarction Plan: Continue with current plan of care           [facility] Student SLP   10/06/2021, 3:08 PM

## 2021-10-06 NOTE — Progress Notes (Signed)
Per nursing the patient's neurological examination has remained stable (perhaps better as they do not note nystagmus or significant ataxia)  Unfortunately ultrasound guided IV has likely infiltrated based on IV watch device alarming read and patient reporting pain  Given patient is remained neurologically stable, okay to continue close neurochecks and place PICC line later today  Brooke Dare MD-PhD [facility] [phone]

## 2021-10-06 NOTE — Progress Notes (Signed)
Physical Therapy Treatment Patient Details Name: [name] MRN: [id number] DOB: 12/01/1945 Today's Date: 10/06/2021   History of Present Illness Pt is a 76 y/o female admitted secondary to dizziness and nausea/vomiting. 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 Neuro event 8/27, CTH with significant progression of cytotoxic edema in R cerebellar hemisphere with extension into L hemisphere and near effacement of 4th ventricle. PMH includes COPD, HTN, and tobacco use.    PT Comments    Patient seen in conjunction with OT due to worsening of neurological symptoms as well as CTH. Session focused on transfers and gait training. Requires assist of 2 for standing and for gait with use of RW for support. Noted to have ataxic trunk and right side needing Mod A of 2 to help with balance, right foot placement and RW management. Favors right lateral lean esp when fatigued. HR up to 130s bpm with activity. Pt reports feeling tired due to being given pain medication for her head/neck/back pain but motivated to work with therapy. Continues to have good therapy potential and would be a great AIR candidate. Will follow.     Recommendations for follow up therapy are one component of a multi-disciplinary discharge planning process, led by the attending physician.  Recommendations may be updated based on patient status, additional functional criteria and insurance authorization.  Follow Up Recommendations  Acute inpatient rehab (3hours/day)     Assistance Recommended at Discharge Frequent or constant Supervision/Assistance  Patient can return home with the following Two people to help with walking and/or transfers;Assistance with cooking/housework;Help with stairs or ramp for entrance;Assist for transportation;Two people to help with bathing/dressing/bathroom   Equipment Recommendations  Other (comment) (TBA)    Recommendations for Other Services       Precautions / Restrictions Precautions Precautions:  Fall;Other (comment) Precaution Comments: watch HR Restrictions Weight Bearing Restrictions: No     Mobility  Bed Mobility Overal bed mobility: Needs Assistance Bed Mobility: Supine to Sit, Sit to Supine     Supine to sit: HOB elevated, Mod assist Sit to supine: Min assist, HOB elevated   General bed mobility comments: Step by step cues to reach for rail on left and assist with trunk to get to EOB.    Transfers Overall transfer level: Needs assistance Equipment used: Rolling walker (2 wheels) Transfers: Sit to/from Stand Sit to Stand: Min assist, +2 physical assistance, +2 safety/equipment           General transfer comment: Min A to stand from EOB x2 with ataxic and quick movement to rise with right lateral lean. No knee buckling noted on right.    Ambulation/Gait Ambulation/Gait assistance: Mod assist, +2 physical assistance Gait Distance (Feet): 8 Feet Assistive device: Rolling walker (2 wheels) Gait Pattern/deviations: Ataxic, Narrow base of support, Decreased weight shift to right, Step-to pattern, Step-through pattern Gait velocity: decreased Gait velocity interpretation: <1.8 ft/sec, indicate of risk for recurrent falls   General Gait Details: Ataxic like gait with incoordinated movement of RLE with narrow BoS. Assist needed for trunk ataxia and Right foot placement as well as RW management. Side stepped along side bed as well with assist for balance and weight shifting, favors right lateral lean and slumps over without warning. HR up to 130s bpm.   Stairs             Wheelchair Mobility    Modified Rankin (Stroke Patients Only) Modified Rankin (Stroke Patients Only) Pre-Morbid Rankin Score: No symptoms Modified Rankin: Moderately severe disability  Balance Overall balance assessment: Needs assistance Sitting-balance support: Feet supported, Bilateral upper extremity supported Sitting balance-Leahy Scale: Fair Sitting balance - Comments: min G  for sitting balance, BUE supported on bed, favors right lateral lean Postural control: Right lateral lean Standing balance support: During functional activity, Bilateral upper extremity supported Standing balance-Leahy Scale: Poor Standing balance comment: Requires external support and UE support. Right lateral lean, ataxic trunk and right side.                            Cognition Arousal/Alertness: Awake/alert Behavior During Therapy: WFL for tasks assessed/performed Overall Cognitive Status: Impaired/Different from baseline Area of Impairment: Attention, Following commands, Memory, Safety/judgement, Awareness, Problem solving                   Current Attention Level: Sustained Memory: Decreased recall of precautions Following Commands: Follows one step commands consistently Safety/Judgement: Decreased awareness of safety, Decreased awareness of deficits Awareness: Emergent Problem Solving: Slow processing, Requires verbal cues General Comments: oriented, slow processing, flat affect, appropriate for tasks addressed this date        Exercises      General Comments General comments (skin integrity, edema, etc.): Family present. HR up to 130s bpm with activity max.      Pertinent Vitals/Pain Pain Assessment Pain Assessment: Faces Faces Pain Scale: Hurts little more Pain Location: headache - generalized with movement, neck/back Pain Descriptors / Indicators: Discomfort, Grimacing Pain Intervention(s): Premedicated before session, Monitored during session, Repositioned, Limited activity within patient's tolerance    Home Living                          Prior Function            PT Goals (current goals can now be found in the care plan section) Progress towards PT goals: Progressing toward goals    Frequency    Min 4X/week      PT Plan Current plan remains appropriate    Co-evaluation PT/OT/SLP Co-Evaluation/Treatment: Yes Reason  for Co-Treatment: Complexity of the patient's impairments (multi-system involvement);For patient/therapist safety;To address functional/ADL transfers PT goals addressed during session: Mobility/safety with mobility;Balance OT goals addressed during session: ADL's and self-care      AM-PAC PT "6 Clicks" Mobility   Outcome Measure  Help needed turning from your back to your side while in a flat bed without using bedrails?: A Little Help needed moving from lying on your back to sitting on the side of a flat bed without using bedrails?: A Lot Help needed moving to and from a bed to a chair (including a wheelchair)?: A Lot Help needed standing up from a chair using your arms (e.g., wheelchair or bedside chair)?: A Lot Help needed to walk in hospital room?: Total Help needed climbing 3-5 steps with a railing? : Total 6 Click Score: 11    End of Session Equipment Utilized During Treatment: Gait belt Activity Tolerance: Patient tolerated treatment well Patient left: in bed;with call bell/phone within reach;with bed alarm set;with family/visitor present Nurse Communication: Mobility status PT Visit Diagnosis: Unsteadiness on feet (R26.81);Muscle weakness (generalized) (M62.81)     Time: 0626-9485 PT Time Calculation (min) (ACUTE ONLY): 23 min  Charges:  $Therapeutic Activity: 8-22 mins                     Vale Haven, PT, DPT Acute Rehabilitation Services Secure chat preferred Office [phone]  Marguarite Arbour A Maclain Cohron 10/06/2021, 1:11 PM

## 2021-10-06 NOTE — Progress Notes (Signed)
IVT consulted to assess recently placed uspiv g20 1.88 to R arm; pt stating it hurts. Assessed and able to draw  blood only  when IV is pulled back 0.5 cm . Removed intact and dry gauze placed. Assessed both arms w/ usn but unable to see suitable veins appropriate for IV meds ordered. RN also at bedside and is aware of status; Pt's daughter at bedside and requesting not to place IV until MD is called;RN notified and calling MD.Recommend PICC placement.

## 2021-10-06 NOTE — Progress Notes (Signed)
An USGPIV (ultrasound guided PIV) has been placed for short-term vasopressor infusion. A correctly placed ivWatch must be used when administering Vasopressors. Should this treatment be needed beyond 72 hours, central line access should be obtained.  It will be the responsibility of the bedside nurse to follow best practice to prevent extravasations.   ?

## 2021-10-06 NOTE — Progress Notes (Signed)
Inpatient Rehab Admissions Coordinator:  Pt had a change in status and was transferred to ICU. Will continue to follow to monitor medical workup and progress with therapies.   Wolfgang Phoenix, MS, CCC-SLP Admissions Coordinator [phone]

## 2021-10-06 NOTE — Progress Notes (Addendum)
Hematuria STROKE TEAM PROGRESS NOTE   INTERVAL HISTORY Afebrile.  BP 130-160s. Given PRN hydralazine and labetalol. Reported to have worsening HA, slurred speech, dysphagia, double vision last evening. CTH with cytotoxic edema, no hydrocephalus. NSGY did not feel patient was a candidate for any surgical intervention due to age and comorbidities. Transferred to ICU for HTS and close monitoring. IV likely infiltrated based on IV watch device alarming and patient reported pain. HTS d/c'ed 2/2 no IV access. Na 134, K 3.1, Mg wnl.     Daughter at bedside. Patient lying on side in bed this AM. Initially somnolent but gradually awakens during interview. She is oriented to self, can recognize daughters, but not oriented to place or time. Discussed need for IV access for HTS. Daughter reported did not want to attempt IV access again last night due to patient pain. Discussed attempting PIV access again prior to placing PICC line. Discussed aggressive interventions such as ventriculostomy if patient worsens, daughter states she doesn't think her mom would want aggressive interventions but needs to discuss with siblings. She does state temporary intubation could be considered.   CTH last night with significant progression of cytotoxic edema in R cerebellar hemisphere with extension into L hemisphere and near effacement of 4th ventricle.  No hydrocephalus   Vitals:   10/06/21 0500 10/06/21 0530 10/06/21 0600 10/06/21 0630  BP: (!) 167/72 121/62 129/73 (!) 148/77  Pulse: (!) 101 94 92 97  Resp: (!) 27 19 16 20   Temp:      TempSrc:      SpO2: 91% 97% 97% 97%  Weight:      Height:       CBC:  Recent Labs  Lab 10/01/2021 2011 10/03/21 0358 10/05/21 2309  WBC 9.0 8.2 14.1*  NEUTROABS 8.1*  --  12.3*  HGB 14.4 14.4 16.3*  HCT 42.7 42.0 46.7*  MCV 94.5 93.8 91.2  PLT 188 180 221   Basic Metabolic Panel:  Recent Labs  Lab 10/03/21 0358 10/05/21 2309  NA 134* 134*  K 3.3* 3.1*  CL 99 98  CO2 23 26   GLUCOSE 132* 124*  BUN 11 27*  CREATININE 0.47 0.51  CALCIUM 8.7* 9.3  MG  --  2.2   Lipid Panel:  Recent Labs  Lab 10/03/21 0358  CHOL 231*  TRIG 46  HDL 93  CHOLHDL 2.5  VLDL 9  LDLCALC 129*   HgbA1c:  Recent Labs  Lab 10/03/21 0358  HGBA1C 5.3   Urine Drug Screen:  Recent Labs  Lab 10/03/21 0151  LABOPIA POSITIVE*  COCAINSCRNUR NONE DETECTED  LABBENZ NONE DETECTED  AMPHETMU NONE DETECTED  THCU NONE DETECTED  LABBARB POSITIVE*    Alcohol Level No results for input(s): "ETH" in the last 168 hours.  IMAGING past 24 hours DG Chest Port 1 View  Result Date: 10/05/2021 CLINICAL DATA:  Difficulty swallowing EXAM: PORTABLE CHEST 1 VIEW COMPARISON:  09/16/2021 FINDINGS: Cardiac shadow is within normal limits. Aortic calcifications are again noted. Lungs are well aerated bilaterally without focal infiltrate or effusion. No acute bony abnormality is seen. IMPRESSION: No acute abnormality noted. Electronically Signed   By: 11/16/2021 M.D.   On: 10/05/2021 23:19   CT HEAD WO CONTRAST (10/08/2021)  Result Date: 10/05/2021 CLINICAL DATA:  Stroke follow-up EXAM: CT HEAD WITHOUT CONTRAST TECHNIQUE: Contiguous axial images were obtained from the base of the skull through the vertex without intravenous contrast. RADIATION DOSE REDUCTION: This exam was performed according to the departmental dose-optimization program  which includes automated exposure control, adjustment of the mA and/or kV according to patient size and/or use of iterative reconstruction technique. COMPARISON:  None Available. FINDINGS: Brain: Marked worsening of cytotoxic edema in the right cerebellar hemisphere with near effacement of fourth ventricle. No hydrocephalus. Unchanged appearance of old right PCA and left cerebellar infarcts. No acute hemorrhage. Vascular: No abnormal hyperdensity of the major intracranial arteries or dural venous sinuses. No intracranial atherosclerosis. Skull: The visualized skull base, calvarium  and extracranial soft tissues are normal. Sinuses/Orbits: No fluid levels or advanced mucosal thickening of the visualized paranasal sinuses. No mastoid or middle ear effusion. The orbits are normal. IMPRESSION: 1. Marked worsening of cytotoxic edema in the right cerebellar hemisphere with near effacement of the fourth ventricle. No hydrocephalus, though there is high likelihood of noncommunicating hydrocephalus developing. 2. No acute hemorrhage. Electronically Signed   By: Ulyses Jarred M.D.   On: 10/05/2021 21:01    PHYSICAL EXAM  Physical Exam  Constitutional: Appears frail pleasant elderly Caucasian lady Cardiovascular: Normal rate and regular rhythm.  Respiratory: Effort normal, non-labored breathing  Neuro: MS: Patient is drowsy but opens eyes to voice, follows simple commands, oriented to self, can recognize daughters. Not oriented to place or time.  Dysarthria but can be understood. CN: Visual fields full. Right facial droop, symmetric sensation, tongue midline Motor: 4/5 strength on BUE and BLE.  Cerebellar: Ataxic, worse on the right than the left with FTN testing and HTS.  Sensory: Intact to light touch throughout   ASSESSMENT/PLAN [name] is a 76 y.o. female with history of hypertension, hyperlipidemia, hypothyroidism, ongoing tobacco abuse (perhaps 1 pack/day), COPD, prior gastrectomy (2016), degenerative disc disease of the C-spine with chronic C1 fracture, chronic pain on chronic opiates who presented to ED with persistent dizziness. CTA demonstrated right PICA stroke for which neurology was consulted.   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 CTA head & neck right V3 occlusion with V4 reconstitution but stenosis, right PICA occlusion, left V2 occlusion with  reconstitution, left P2 stenosis MRI  large right PICA infarct in the bilateral small old PICA infarct [facility] 8/27: Marked worsening of cytotoxic edema in the right cerebellar hemisphere with near effacement of the fourth ventricle. No hydrocephalus, though there is high likelihood of noncommunicating hydrocephalus developing. CXR unremarkable LDL 129 HgbA1c 5.3 2D echo EF 65 to 70% VTE prophylaxis - heparin  No antithrombotic prior to admission, now on aspirin 325 mg daily and clopidogrel 75 mg daily for 3 months then ASA monotherapy given intracranial large vessel disease. Recommend CT head if neuro changes AND at time of discharge due to increased risk of reactive cytotoxic cerebral edema with cerebellar infarct  Therapy recommendations: CIR Disposition:  pending  Cerebral edema  CTH 8/27: Marked worsening of cytotoxic edema in the right cerebellar hemisphere with near effacement of the fourth ventricle. No hydrocephalus at this time.  8/28 started HTS, d/c'ed due to no IV access. Restart HTS @50cc /hr. Na goal 150-155. Discussed attempting PIV placement again today. PIV placed without US guidance. Family hesitant about PICC line.  Current BP goal 120-160  No NSGY intervention 2/2 age and comorbidities NPO due to worsening dysphagia  Palliative care consult   Dysphagia Currently NPO  SLP to re-evaluate  Hypokalemia K 3.1 yesterday  S/p Kclor x2 and Kcl IV  Repeat BMP tomorrow AM   Hyperthyroidism TSH < 0.01 Repeat TSH <0.01. Free T4 1.04 Likely the cause of her severe HTN  Hypertensive urgency Home meds:  amlodipine 5mg , restarted Stable Gradually normalize in 3-5 days On amlodipine 5->10mg  and hydralazine 10->25 Q8h Started on carvedilol PRN labetalol and hydralazine  Long-term BP goal normotensive  Hyperlipidemia Home meds:  none LDL 129, goal < 70 Add crestor 40mg   Continue statin at discharge  Chronic pain on chronic opiates  PRN fentanyl 12.45mcg q4H  Tobacco  abuse Current smoker Smoking cessation counseling provided Pt is willing to quit  Other Stroke Risk Factors Advanced Age >/= 41   Other Active Problems COPD mixed type  Followed by Dr. 4m  Continued on ICS-LAMA-L[facility] Chronic pain  Hospital day # 3  I have personally obtained history,examined this patient, reviewed notes, independently viewed imaging studies, participated in medical decision making and plan of care.ROS completed by me personally and pertinent positives fully documented  I have made any additions or clarifications directly to the above note. Agree with note above.  Patient has had slight neurological worsening secondary to progressive cytotoxic edema with some mass effect on fourth ventricle but no hydrocephalus.  Family does not want aggressive neurosurgical intervention but agreeable to medical management and start hypertonic saline through peripheral IV and give 250 cc 3% saline bolus followed by 50 cc an hour with serum sodium goal 150-155.  Family also agreeable to meet with palliative care to discuss goals of care.  Unlikely to be able to swallow today due to dysphagia and hence will hold oral pills and medications and recheck swallowing tomorrow.  Long discussion with daughter at the bedside and answered questions.This patient is critically ill and at significant risk of neurological worsening, death and care requires constant monitoring of vital signs, hemodynamics,respiratory and cardiac monitoring, extensive review of multiple databases, frequent neurological assessment, discussion with family, other specialists and medical decision making of high complexity.I have made any additions or clarifications directly to the above note.This critical care time does not reflect procedure time, or teaching time or supervisory time of PA/NP/Med Resident etc but could involve care discussion time.  I spent 30 minutes of neurocritical care time  in the care of  this patient.       74, MD Medical Director [facility] Stroke Center Pager: [phone] 10/06/2021 2:55 PM   To contact Stroke Continuity provider, please refer to [phone]. After hours, contact General Neurology

## 2021-10-07 ENCOUNTER — Inpatient Hospital Stay (HOSPITAL_COMMUNITY): Payer: Medicare Other

## 2021-10-07 DIAGNOSIS — Z515 Encounter for palliative care: Secondary | ICD-10-CM | POA: Diagnosis not present

## 2021-10-07 DIAGNOSIS — Z66 Do not resuscitate: Secondary | ICD-10-CM

## 2021-10-07 DIAGNOSIS — Z7189 Other specified counseling: Secondary | ICD-10-CM | POA: Diagnosis not present

## 2021-10-07 DIAGNOSIS — I639 Cerebral infarction, unspecified: Secondary | ICD-10-CM | POA: Diagnosis not present

## 2021-10-07 LAB — COMPREHENSIVE METABOLIC PANEL
ALT: 48 U/L — ABNORMAL HIGH (ref 0–44)
AST: 42 U/L — ABNORMAL HIGH (ref 15–41)
Albumin: 3.3 g/dL — ABNORMAL LOW (ref 3.5–5.0)
Alkaline Phosphatase: 127 U/L — ABNORMAL HIGH (ref 38–126)
Anion gap: 9 (ref 5–15)
BUN: 29 mg/dL — ABNORMAL HIGH (ref 8–23)
CO2: 21 mmol/L — ABNORMAL LOW (ref 22–32)
Calcium: 8.8 mg/dL — ABNORMAL LOW (ref 8.9–10.3)
Chloride: 125 mmol/L — ABNORMAL HIGH (ref 98–111)
Creatinine, Ser: 0.56 mg/dL (ref 0.44–1.00)
GFR, Estimated: >60 mL/min (ref >60)
Glucose, Bld: 127 mg/dL — ABNORMAL HIGH (ref 70–99)
Potassium: 3.4 mmol/L — ABNORMAL LOW (ref 3.5–5.1)
Sodium: 155 mmol/L — ABNORMAL HIGH (ref 135–145)
Total Bilirubin: 0.4 mg/dL (ref 0.3–1.2)
Total Protein: 5.6 g/dL — ABNORMAL LOW (ref 6.5–8.1)

## 2021-10-07 LAB — CBC
HCT: 43.1 % (ref 36.0–46.0)
Hemoglobin: 14.2 g/dL (ref 12.0–15.0)
MCH: 32.1 pg (ref 26.0–34.0)
MCHC: 32.9 g/dL (ref 30.0–36.0)
MCV: 97.5 fL (ref 80.0–100.0)
Platelets: 178 10*3/uL (ref 150–400)
RBC: 4.42 MIL/uL (ref 3.87–5.11)
RDW: 13.2 % (ref 11.5–15.5)
WBC: 9.8 10*3/uL (ref 4.0–10.5)
nRBC: 0 % (ref 0.0–0.2)

## 2021-10-07 LAB — MAGNESIUM: Magnesium: 2.5 mg/dL — ABNORMAL HIGH (ref 1.7–2.4)

## 2021-10-07 LAB — SODIUM: Sodium: 157 mmol/L — ABNORMAL HIGH (ref 135–145)

## 2021-10-07 LAB — PHOSPHORUS: Phosphorus: 4.1 mg/dL (ref 2.5–4.6)

## 2021-10-07 MED ORDER — AMLODIPINE BESYLATE 10 MG PO TABS: 10.0000 mg | ORAL_TABLET | Freq: Every day | ORAL | Status: DC

## 2021-10-07 MED ORDER — POTASSIUM CHLORIDE 10 MEQ/100ML IV SOLN
10.0000 meq | INTRAVENOUS | Status: AC
  Administered 2021-10-07 (×4): 10 meq via INTRAVENOUS
  Filled 2021-10-07 (×7): qty 100

## 2021-10-07 MED ORDER — PROSOURCE TF20 ENFIT COMPATIBL EN LIQD: 60.0000 mL | Freq: Every day | ENTERAL | Status: DC

## 2021-10-07 MED ORDER — MELATONIN 3 MG PO TABS
3.0000 mg | ORAL_TABLET | Freq: Every day | ORAL | Status: DC
Start: 2021-10-07 — End: 2021-10-07

## 2021-10-07 MED ORDER — ROSUVASTATIN CALCIUM 20 MG PO TABS: 40.0000 mg | ORAL_TABLET | Freq: Every day | ORAL | Status: DC

## 2021-10-07 MED ORDER — HYDRALAZINE HCL 25 MG PO TABS: 25.0000 mg | ORAL_TABLET | Freq: Three times a day (TID) | ORAL | Status: DC

## 2021-10-07 MED ORDER — CLOPIDOGREL BISULFATE 75 MG PO TABS: 75.0000 mg | ORAL_TABLET | Freq: Every day | ORAL | Status: DC

## 2021-10-07 MED ORDER — CARVEDILOL 3.125 MG PO TABS
3.1250 mg | ORAL_TABLET | Freq: Two times a day (BID) | ORAL | Status: DC
Start: 2021-10-07 — End: 2021-10-07

## 2021-10-07 MED ORDER — FENTANYL CITRATE PF 50 MCG/ML IJ SOSY
12.5000 ug | PREFILLED_SYRINGE | Freq: Once | INTRAMUSCULAR | Status: AC
  Administered 2021-10-07: 12.5 ug via INTRAVENOUS

## 2021-10-07 MED ORDER — MECLIZINE HCL 12.5 MG PO TABS
12.5000 mg | ORAL_TABLET | Freq: Three times a day (TID) | ORAL | Status: DC | PRN
Start: 2021-10-07 — End: 2021-10-07

## 2021-10-07 MED ORDER — ASPIRIN 300 MG RE SUPP
300.0000 mg | Freq: Once | RECTAL | Status: AC
  Administered 2021-10-07: 300 mg via RECTAL
  Filled 2021-10-07: qty 1

## 2021-10-07 MED ORDER — NALOXONE HCL 0.4 MG/ML IJ SOLN
INTRAMUSCULAR | Status: AC
  Filled 2021-10-07: qty 1

## 2021-10-07 MED ORDER — OSMOLITE 1.5 CAL PO LIQD: 1000.0000 mL | ORAL | Status: DC

## 2021-10-07 MED ORDER — ATROPINE SULFATE 1 MG/10ML IJ SOSY
PREFILLED_SYRINGE | INTRAMUSCULAR | Status: AC
  Administered 2021-10-07: 1 mg
  Filled 2021-10-07: qty 10

## 2021-10-08 LAB — VITAMIN B1: Vitamin B1 (Thiamine): 130.5 nmol/L (ref 66.5–200.0)

## 2021-10-10 NOTE — Progress Notes (Signed)
   10/08/21 1535  Clinical Encounter Type  Visited With Family  Visit Type Initial;Death  Referral From Nurse  Consult/Referral To Chaplain   Chaplain responded to a call for the nurse asking for support for the family of the patient. The patient , [name], died.  Family was thankful of the time they had with Dois Davenport and the memories they will carry with them. They shared that they had a family prayer prior to my arrival and were satisfied with that.  I expressed my sorrow for their loss and advised them if they wished to speak to a chaplain later to let their nurse know. Someone would respond.  Valerie Roys [facility]  [phone]

## 2021-10-10 NOTE — Progress Notes (Addendum)
Hematuria STROKE TEAM PROGRESS NOTE   INTERVAL HISTORY Afebrile.  BP 130s. HR 110s. Given PRN hydralazine, labetalol, fentanyl. Received 3% HTS bolus . 3% HTS @ 50cc/hr. D/c'ed due to Na 155 -> 157. K 3.4. CBC wnl.  Serum sodium was 155 and hypertonic saline drip has been held.  Last serum sodium is still 157 Monitor rhythm shows atrial fibrillation with heart rate in the 100-1 10 range with regular rhythm Son and daughter at bedside. Patient alert, disoriented, able to follow commands. Continues to have right sided ataxia. Discussed feeding tube and Foley with family, agreed to both. Discussed patient's imaging findings. Patient with Afib on monitor. Will obtain EKG. Discussed timing of AC, which would be delayed in the setting of her large infarct c/b cytotoxic edema and dysphagia.  And risk of hemorrhagic transformation.  Palliative meeting with family today.    [facility] 8/27 with significant progression of cytotoxic edema in R cerebellar hemisphere with extension into L hemisphere and near effacement of 4th ventricle.  No hydrocephalus.   Vitals:   09/20/2021 0416 09/22/2021 0500 10/01/2021 0600 10/06/2021 0700  BP:  139/85 (!) 130/103   Pulse: 98 92 93   Resp: (!) 28 (!) 27 (!) 22 (!) 28  Temp:      TempSrc:      SpO2: 95% 97% 95%   Weight:      Height:       CBC:  Recent Labs  Lab 2021/10/31 2011 10/03/21 0358 10/05/21 2309  WBC 9.0 8.2 14.1*  NEUTROABS 8.1*  --  12.3*  HGB 14.4 14.4 16.3*  HCT 42.7 42.0 46.7*  MCV 94.5 93.8 91.2  PLT 188 180 221   Basic Metabolic Panel:  Recent Labs  Lab 10/05/21 2309 10/06/21 1050 10/06/21 2014 09/09/2021 0200  NA 134*   < > 146* 155*  K 3.1*  --   --  3.4*  CL 98  --   --  125*  CO2 26  --   --  21*  GLUCOSE 124*  --   --  127*  BUN 27*  --   --  29*  CREATININE 0.51  --   --  0.56  CALCIUM 9.3  --   --  8.8*  MG 2.2  --   --   --    < > = values in this interval not displayed.   Lipid Panel:  Recent Labs  Lab 10/03/21 0358  CHOL  231*  TRIG 46  HDL 93  CHOLHDL 2.5  VLDL 9  LDLCALC 129*   HgbA1c:  Recent Labs  Lab 10/03/21 0358  HGBA1C 5.3   Urine Drug Screen:  Recent Labs  Lab 10/03/21 0151  LABOPIA POSITIVE*  COCAINSCRNUR NONE DETECTED  LABBENZ NONE DETECTED  AMPHETMU NONE DETECTED  THCU NONE DETECTED  LABBARB POSITIVE*    Alcohol Level No results for input(s): "ETH" in the last 168 hours.  IMAGING past 24 hours No results found.  PHYSICAL EXAM  Physical Exam  Constitutional: Appears frail pleasant elderly Caucasian lady Cardiovascular: Tachycardic and irregular rhythm.  Respiratory: Effort normal, non-labored breathing  Neuro: MS: Patient is alert and interactive, follows simple commands, oriented to self. Not oriented to place or time.  Severe dysarthria but can be understood. CN: Visual fields full. Right facial droop, symmetric sensation, tongue midline Motor: 4/5 strength on BUE and BLE.  Cerebellar: Ataxic, worse on the right than the left with FTN testing and HTS.  Sensory: Intact to light touch throughout  ASSESSMENT/PLAN Ms. TIFFY CHEN is a 76 y.o. female with history of hypertension, hyperlipidemia, hypothyroidism, ongoing tobacco abuse (perhaps 1 pack/day), COPD, prior gastrectomy (2016), degenerative disc disease of the C-spine with chronic C1 fracture, chronic pain on chronic opiates who presented to ED with persistent dizziness. CTA demonstrated right PICA stroke for which neurology was consulted.   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 CTA head & neck right V3 occlusion with V4 reconstitution but stenosis, right PICA occlusion, left V2 occlusion with reconstitution, left P2 stenosis MRI  large right PICA infarct in the  bilateral small old PICA infarct [facility] 8/27: Marked worsening of cytotoxic edema in the right cerebellar hemisphere with near effacement of the fourth ventricle. No hydrocephalus, though there is high likelihood of noncommunicating hydrocephalus developing. CXR unremarkable LDL 129 HgbA1c 5.3 2D echo EF 65 to 70% VTE prophylaxis - heparin  No antithrombotic prior to admission, now on aspirin 300 mg suppository daily given lack of PO access. Given new onset Afib, will determine timing of starting oral AC in setting of her infarct and dysphagia.  Recommend CT head if neuro changes AND at time of discharge due to increased risk of reactive cytotoxic cerebral edema with cerebellar infarct  Therapy recommendations: CIR Disposition:  pending  Cerebral edema  CTH 8/27: Marked worsening of cytotoxic edema in the right cerebellar hemisphere with near effacement of the fourth ventricle. No hydrocephalus at this time.  8/28 started HTS, d/c due to no IV access. Restarted HTS in PIV @50cc /hr. Na goal 150-155. Na 146 -> 155 -> 157  8/29 HTS d/c'ed due at goal. Restart @40cc /hr if Na <150.  Current BP goal 120-160  No NSGY intervention 2/2 age and comorbidities NPO due to worsening dysphagia  Palliative care consult  Repeat CTH tomorrow AM  New-onset Afib EKG with Afib   HR 90-100s, keep HR <110 No PO access currently, has PRN IV labetalol and hydralazine  Changed medications to per tube Defer AC until patient improves, able to have meds   Dysphagia Currently NPO  SLP following  Place coretrak today  Hypokalemia K 3.1 yesterday  S/p Kclor x2 and Kcl IV Repeat K 3.4  Ordered IV K repletion   Hyperthyroidism TSH < 0.01 Repeat TSH <0.01. Free T4 1.04 Likely the cause of her severe HTN  Hypertensive urgency Home meds:  amlodipine 5mg , restarted Stable Gradually normalize in 3-5 days On amlodipine 5->10mg  and hydralazine 10->25 Q8h Started on carvedilol PRN labetalol and hydralazine   Long-term BP goal normotensive  Hyperlipidemia Home meds:  none LDL 129, goal < 70 Add crestor 40mg   Continue statin at discharge  Chronic pain on chronic opiates  PRN fentanyl 12.22mcg q4H  Tobacco abuse Current smoker Smoking cessation counseling provided Pt is willing to quit  Other Stroke Risk Factors Advanced Age >/= 11   Other Active Problems COPD mixed type  Followed by Dr. Annamaria Boots  Continued on [place] Chronic pain  Hospital day # 4  Rolanda Lundborg, MD, PGY-1  I have personally obtained history,examined this patient, reviewed notes, independently viewed imaging studies, participated in medical decision making and plan of care.ROS completed by me personally and pertinent positives fully documented  I have made any additions or clarifications directly to the above note. Agree with note above.  Patient neurological exam remains stable she is more alert and interactive but remains marked dysarthria and right hemiataxia and dysphagia.  Serum sodium is above goal hence hold hypertonic saline drip.  Repeat CT head without contrast tomorrow morning.  Blood serum sodium gradually come down by itself.  Please therapy recommends mL by mouth.  Insert Panda tube for tube feeding and medications.  She has new onset A-fib but is not an anticoagulation candidate at the present time due to large infarct and risk of hemorrhagic transformation we will wait at least 5 to 7 days and until she can likely swallow safely.  Use IV metoprolol or Cardizem as needed for rate control.  Mobilize out of bed.  Physical occupational and speech therapy consults.  Family meeting with palliative care later today to decide on goals of care.  Long discussion with patient and her son and daughter at the bedside and answered questions.  Discussed with palliative care nurse practitioner.This patient is critically ill and at significant risk of neurological worsening, death and care requires constant monitoring of  vital signs, hemodynamics,respiratory and cardiac monitoring, extensive review of multiple databases, frequent neurological assessment, discussion with family, other specialists and medical decision making of high complexity.I have made any additions or clarifications directly to the above note.This critical care time does not reflect procedure time, or teaching time or supervisory time of PA/NP/Med Resident etc but could involve care discussion time.  I spent 35 minutes of neurocritical care time  in the care of  this patient.      Delia Heady, MD Medical Director [facility] Stroke Center Pager: [phone] 2021-10-11 12:49 PM  To contact Stroke Continuity provider, please refer to [email or web address]. After hours, contact General Neurology

## 2021-10-10 NOTE — Procedures (Signed)
Cortrak  Person Inserting Tube:  Greig Castilla D, RD Tube Type:  Cortrak - 43 inches Tube Size:  10 Tube Location:  Left nare Secured by: Bridle Technique Used to Measure Tube Placement:  Marking at nare/corner of mouth Cortrak Secured At:  56 cm  Cortrak Tube Team Note:  Consult received to place a Cortrak feeding tube.   X-ray is required, abdominal x-ray has been ordered by the Cortrak team. Please confirm tube placement before using the Cortrak tube.   If the tube becomes dislodged please keep the tube and contact the Cortrak team at [email or web address] ([email or web address]) for replacement.  If after hours and replacement cannot be delayed, place a NG tube and confirm placement with an abdominal x-ray.    Greig Castilla, RD, LDN Clinical Dietitian RD pager # available in AMION  After hours/weekend pager # available in [facility]

## 2021-10-10 NOTE — Progress Notes (Signed)
We paused the 3 % per MD and pharmacist recommendation. Sodium level is 155.

## 2021-10-10 NOTE — Progress Notes (Signed)
1320: Pt c/o extreme upper neck pain and tachypneic. Dr. Pearlean Brownie notified and order received for 12.5 mcg Fentanyl. Fentanyl given as ordered and pt tolerated well.   1410: Called to pt room by nurse d/t pt's condition deteriorating. Pt found to be apneic and bradycardic with faint central pulses. Pt is DNR.   1413: Dr. Pearlean Brownie at bedside. Narcan given with no improvement in pt condition. 1 mg Atropine given. Pt responded briefly to Atropine, but quickly progressed to PEA. Additional dose of Narcan given and 1 mg Atropine repeated with no pt response.   1415: Family at bedside.   1420: Cardiac death confirmed by self and Emogene Morgan, RN.

## 2021-10-10 NOTE — Consult Note (Signed)
Consultation Note Date: 10/30/21   Patient Name: [name]  DOB: February 17, 1945  MRN: [id number]  Age / Sex: 76 y.o., female  PCP: Sheila Low, MD Referring Physician: Stroke, Md, MD  Reason for Consultation: Establishing goals of care  HPI/Patient Profile: 76 y.o. female  with past medical history of chronic pain, COPD, hypothyroidism, hypertension, tobacco abuse admitted on 10/08/2021 with dizziness with nausea/vomiting due to right cerebellar infarct with cerebral edema likely due to large vessel disease from [place] stenosis/occlusion.   Clinical Assessment and Goals of Care: I met today at [facility] bedside as Dr. Leonie Beard is finishing his visit. Daughter, [name], and [name]'s husband are at bedside. [name] is awake and able to nod head and seems aware of her situation. However, she appears to have labored breathing and decline with stroke and edema. I met privately with [name] and [name]. We discussed [name]'s worsening status and I expressed my concern for her breathing and overall prognosis and quality of life. [name] shares that she and her siblings have been having the difficult conversations. We discussed course of action if [name]'s gets worse instead of better. [name] shares that family have discussed and they know that [name] would not want to be placed on life support. We discussed DNR status and [name] shares that this is what they want for their mother and siblings agree. However, they do wish to move forward with Cortrak and other interventions to give her opportunity for improvement. She says that they would not want PEG tube. They do not want life prolonging measures without quality. We did speak that she is at risk for having another stroke or acute decline at any time. [name] understands. We discussed timing of family visit and I expressed that I recommend sooner than later to visit. We agreed for DNR today and to  take one day at a time.   All questions/concerns addressed. Emotional support provided.   Primary Decision Maker NEXT OF KIN adult children    SUMMARY OF RECOMMENDATIONS   - DNR - Continue current interventions - Time for outcomes  Code Status/Advance Care Planning: DNR   Symptom Management:  Per attending, neurology  Prognosis:  Overall prognosis poor. High risk for acute decompensation.   Discharge Planning: To Be Determined      Primary Diagnoses: Present on Admission:  Acute ischemic stroke ([place])  COPD with emphysema ([place])  Chronic pain syndrome  Acute CVA (cerebrovascular accident) ([place])   I have reviewed the medical record, interviewed the patient and family, and examined the patient. The following aspects are pertinent.  Past Medical History:  Diagnosis Date   Acute bronchitis    Anemia    Arthritis    Chronic rhinitis    COPD (chronic obstructive pulmonary disease) ([facility])    GERD (gastroesophageal reflux disease)    Headache    Hyperthyroidism 2012   thyroidectomy   Hypothyroidism    Thyroid disease    Tobacco use disorder    Social History   Socioeconomic History   Marital status: Divorced  Spouse name: Not on file   Number of children: Not on file   Years of education: Not on file   Highest education level: Not on file  Occupational History   Not on file  Tobacco Use   Smoking status: Every Day    Packs/day: 0.50    Years: 40.00    Total pack years: 20.00    Types: Cigarettes   Smokeless tobacco: Never   Tobacco comments:    1/2 ppd reported 09/16/2021  Vaping Use   Vaping Use: Never used  Substance and Sexual Activity   Alcohol use: Yes    Comment: rarely   Drug use: No   Sexual activity: Not Currently    Birth control/protection: Post-menopausal  Other Topics Concern   Not on file  Social History Narrative   Not on file   Social Determinants of Health   Financial Resource Strain: Not on file  Food Insecurity: Not on  file  Transportation Needs: Not on file  Physical Activity: Not on file  Stress: Not on file  Social Connections: Not on file   Family History  Problem Relation Age of Onset   Cancer Father    Cirrhosis Mother    Breast cancer Neg Hx    Scheduled Meds:  amLODipine  10 mg Oral Daily   aspirin EC  81 mg Oral Daily   aspirin  300 mg Rectal Once   carvedilol  3.125 mg Oral BID WC   Chlorhexidine Gluconate Cloth  6 each Topical Daily   clopidogrel  75 mg Oral Daily   fluticasone furoate-vilanterol  1 puff Inhalation Daily   And   umeclidinium bromide  1 puff Inhalation Daily   heparin injection (subcutaneous)  5,000 Units Subcutaneous Q12H   hydrALAZINE  25 mg Oral Q8H   melatonin  3 mg Oral QHS   nicotine  21 mg Transdermal Daily   mouth rinse  15 mL Mouth Rinse 4 times per day   rosuvastatin  40 mg Oral Daily   Continuous Infusions:  potassium chloride     sodium chloride (hypertonic) Stopped (11-02-2021 0442)   PRN Meds:.acetaminophen **OR** acetaminophen (TYLENOL) oral liquid 160 mg/5 mL **OR** acetaminophen, fentaNYL (SUBLIMAZE) injection, hyaluronidase Human, hydrALAZINE, labetalol, meclizine, ondansetron (ZOFRAN) IV, mouth rinse Allergies  Allergen Reactions   Doxycycline Itching   Gabapentin Other (See Comments)    Unknown reaction   Levofloxacin Other (See Comments)    REACTION: GI upset, mouth/throat sores   Nsaids Other (See Comments)    Other reaction(s): gastric bleeding   Penicillins Itching   Prednisone Other (See Comments)    Other reaction(s): stomach upset   Review of Systems  Unable to perform ROS: Acuity of condition    Physical Exam Vitals and nursing note reviewed.  Cardiovascular:     Rate and Rhythm: Normal rate.  Pulmonary:     Effort: Tachypnea and accessory muscle usage present.  Abdominal:     Palpations: Abdomen is soft.  Neurological:     Mental Status: She is alert.     Comments: Dysarthria but nods appropriately; follows some  simple commands     Vital Signs: BP (!) 130/103   Pulse 93   Temp (!) 94.2 F (34.6 C) (Rectal) Comment: This was reported to the nurse, warm blankets applied  Resp (!) 28   Ht 5' 2.5" (1.588 m)   Wt 34.9 kg   SpO2 95%   BMI 13.85 kg/m  Pain Scale: 0-10 POSS *See Group Information*: S-Acceptable,Sleep,  easy to arouse Pain Score: Asleep   SpO2: SpO2: 95 % O2 Device:SpO2: 95 % O2 Flow Rate: .O2 Flow Rate (L/min): 2 L/min  IO: Intake/output summary:  Intake/Output Summary (Last 24 hours) at 10-20-2021 1011 Last data filed at 10-20-2021 0500 Gross per 24 hour  Intake 988.84 ml  Output 800 ml  Net 188.84 ml    LBM: Last BM Date : 10/03/21 Baseline Weight: Weight: 34.9 kg Most recent weight: Weight: 34.9 kg     Palliative Assessment/Data:     Time Total: 60 min  Greater than 50%  of this time was spent counseling and coordinating care related to the above assessment and plan.  Signed by: Vinie Sill, NP Palliative Medicine Team Pager # [phone] (M-F 8a-5p) Team Phone # [phone] (Nights/Weekends)

## 2021-10-10 NOTE — Death Summary Note (Cosign Needed Addendum)
DEATH SUMMARY   Patient Details  Name: ALECIA VANDERWOUDE MRN: [id number] DOB: 15-Aug-1945  Admission/Discharge Information   Admit Date:  2021-10-10  Date of Death:   2021/10/15  Time of Death:   14:20  Length of Stay: 4  Referring Physician: Wenda Low, MD   Reason(s) for Hospitalization  R cerebellar infarct in PICA territory  Diagnoses  Preliminary cause of death: Respiratory failure secondary to cerebellar infarct likely due to large vessel disease from [place] stenosis/occlusion complicated by cytotoxic edema with new onset Afib Secondary Diagnoses (including complications and co-morbidities):  Principal Problem:   Acute ischemic stroke [facility]) Active Problems:   COPD with emphysema ([place])   Chronic pain syndrome   Acute CVA (cerebrovascular accident) ([place])  New onset Afib   Cytotoxic edema   Dysphagia   Hypokalemia   Hyperthyroidism   Hypertensive urgency  Tobacco use  Brief Hospital Course (including significant findings, care, treatment, and services provided and events leading to death)  KANETRA MCKENNA is a 76 y.o. year old female with history of hypertension, hyperlipidemia, hypothyroidism, ongoing tobacco abuse (perhaps 1 pack/day), COPD, prior gastrectomy (2016), degenerative disc disease of the C-spine with chronic C1 fracture, chronic pain on chronic opiates who presented to ED with persistent dizziness on 8/25. CT head right subacute PICA infarct, chronic bilateral small PICA infarct. CTA demonstrated right PICA stroke for which neurology was consulted. She was out of window for TNK and hypodensity noted on head CT. MRI with large right PICA infarct in the bilateral small old PICA infarct. Hyperthyroid on admission with TSH <0.01. Also hypertensive on admission, she was restarted on home amlodipine and started on hydralazine and carvedilol. She was admitted inpatient, started on aspirin and plavix. On 8/27, she developed worsening headache, slurred speech, dysphagia, and  double vision. [facility] 8/27 with marked worsening of cytotoxic edema in the right cerebellar hemisphere with near effacement of the fourth ventricle. No hydrocephalus, though there is high likelihood of noncommunicating hydrocephalus developing. She was started on HTS and this was discontinued in the setting of reaching goal Na. She had worsening dysphagia with cytotoxic edema and was evaluated by SLP who recommended NPO. Coretrak was placed today after discussion with family on temporary feeding tube. She was also hypokalemic, K repleted. She was given PRN fentanyl for her chronic pain. She was continued on a statin for her hyperlipidemia. She was noted to have new onset A-fib today but was not an anticoagulation candidate at the present time due to large infarct and risk of hemorrhagic transformation. Plan to wait to restart until 5-7 days after infarct and when she could swallow safely. She was seen by palliative 10-16-22 and family decided on DNR/DNI. Around 1320, patient noted to complain of upper neck pain and was tachypneic. Fentanyl was given. She was found to be apneic and bradycardic with faint central pulses at 1413. Narcan was given with no improvement. Atropine 1mg  was given and patient responded briefly but quickly progressed to PEA. Additional dose of narcan and atropine given with no response. Cardiac death confirmed at 1420.    Pertinent Labs and Studies  Significant Diagnostic Studies DG Chest Port 1 View  Result Date: 10/05/2021 CLINICAL DATA:  Difficulty swallowing EXAM: PORTABLE CHEST 1 VIEW COMPARISON:  09/16/2021 FINDINGS: Cardiac shadow is within normal limits. Aortic calcifications are again noted. Lungs are well aerated bilaterally without focal infiltrate or effusion. No acute bony abnormality is seen. IMPRESSION: No acute abnormality noted. Electronically Signed   By: Inez Catalina  M.D.   On: 10/05/2021 23:19   CT HEAD WO CONTRAST ( )  Result Date: 10/05/2021 CLINICAL DATA:  Stroke  follow-up EXAM: CT HEAD WITHOUT CONTRAST TECHNIQUE: Contiguous axial images were obtained from the base of the skull through the vertex without intravenous contrast. RADIATION DOSE REDUCTION: This exam was performed according to the departmental dose-optimization program which includes automated exposure control, adjustment of the mA and/or kV according to patient size and/or use of iterative reconstruction technique. COMPARISON:  None Available. FINDINGS: Brain: Marked worsening of cytotoxic edema in the right cerebellar hemisphere with near effacement of fourth ventricle. No hydrocephalus. Unchanged appearance of old right PCA and left cerebellar infarcts. No acute hemorrhage. Vascular: No abnormal hyperdensity of the major intracranial arteries or dural venous sinuses. No intracranial atherosclerosis. Skull: The visualized skull base, calvarium and extracranial soft tissues are normal. Sinuses/Orbits: No fluid levels or advanced mucosal thickening of the visualized paranasal sinuses. No mastoid or middle ear effusion. The orbits are normal. IMPRESSION: 1. Marked worsening of cytotoxic edema in the right cerebellar hemisphere with near effacement of the fourth ventricle. No hydrocephalus, though there is high likelihood of noncommunicating hydrocephalus developing. 2. No acute hemorrhage. Electronically Signed   By: Deatra Robinson M.D.   On: 10/05/2021 21:01   ECHOCARDIOGRAM COMPLETE  Result Date: 10/03/2021    ECHOCARDIOGRAM REPORT   Patient Name:   KAMEELAH MINISH [facility] Date of Exam: 10/03/2021 Medical Rec #:  [id number]       Height:       62.0 in Accession #:    [id number]      Weight:       80.8 lb Date of Birth:  28-Jul-1945       BSA:          1.301 m Patient Age:    76 years        BP:           168/67 mmHg Patient Gender: F               HR:           93 bpm. Exam Location:  Inpatient Procedure: 2D Echo, Cardiac Doppler and Color Doppler Indications:    Stroke I63.9  History:        Patient has no prior history  of Echocardiogram examinations.                 COPD; Risk Factors:Current Smoker and GERD.  Sonographer:    Eulah Pont RDCS Referring Phys: [id number] TIMOTHY S OPYD IMPRESSIONS  1. Left ventricular ejection fraction, by estimation, is 65 to 70%. The left ventricle has normal function. The left ventricle has no regional wall motion abnormalities. There is mild asymmetric left ventricular hypertrophy of the basal-septal segment. Left ventricular diastolic parameters are consistent with Grade I diastolic dysfunction (impaired relaxation).  2. Right ventricular systolic function is normal. The right ventricular size is normal. There is normal pulmonary artery systolic pressure.  3. The mitral valve is normal in structure. Mild mitral valve regurgitation. No evidence of mitral stenosis.  4. The aortic valve is tricuspid. Aortic valve regurgitation is not visualized. No aortic stenosis is present.  5. The inferior vena cava is normal in size with greater than 50% respiratory variability, suggesting right atrial pressure of 3 mmHg. FINDINGS  Left Ventricle: Left ventricular ejection fraction, by estimation, is 65 to 70%. The left ventricle has normal function. The left ventricle has no regional wall motion abnormalities. The left ventricular internal cavity  size was normal in size. There is  mild asymmetric left ventricular hypertrophy of the basal-septal segment. Left ventricular diastolic parameters are consistent with Grade I diastolic dysfunction (impaired relaxation). Normal left ventricular filling pressure. Right Ventricle: The right ventricular size is normal. No increase in right ventricular wall thickness. Right ventricular systolic function is normal. There is normal pulmonary artery systolic pressure. The tricuspid regurgitant velocity is 2.09 m/s, and  with an assumed right atrial pressure of 3 mmHg, the estimated right ventricular systolic pressure is 123XX123 mmHg. Left Atrium: Left atrial size was normal in  size. Right Atrium: Right atrial size was normal in size. Pericardium: There is no evidence of pericardial effusion. Mitral Valve: The mitral valve is normal in structure. Mild mitral valve regurgitation. No evidence of mitral valve stenosis. Tricuspid Valve: The tricuspid valve is normal in structure. Tricuspid valve regurgitation is trivial. No evidence of tricuspid stenosis. Aortic Valve: The aortic valve is tricuspid. Aortic valve regurgitation is not visualized. No aortic stenosis is present. Pulmonic Valve: The pulmonic valve was normal in structure. Pulmonic valve regurgitation is not visualized. No evidence of pulmonic stenosis. Aorta: The aortic root is normal in size and structure. Venous: The inferior vena cava is normal in size with greater than 50% respiratory variability, suggesting right atrial pressure of 3 mmHg. IAS/Shunts: No atrial level shunt detected by color flow Doppler.  LEFT VENTRICLE PLAX 2D LVIDd:         3.40 cm     Diastology LVIDs:         2.60 cm     LV e' medial:    5.54 cm/s LV PW:         0.90 cm     LV E/e' medial:  9.5 LV IVS:        1.10 cm     LV e' lateral:   5.73 cm/s LVOT diam:     2.00 cm     LV E/e' lateral: 9.2 LV SV:         51 LV SV Index:   39 LVOT Area:     3.14 cm  LV Volumes (MOD) LV vol d, MOD A2C: 43.9 ml LV vol d, MOD A4C: 45.0 ml LV vol s, MOD A2C: 18.3 ml LV vol s, MOD A4C: 19.7 ml LV SV MOD A2C:     25.6 ml LV SV MOD A4C:     45.0 ml LV SV MOD BP:      27.0 ml RIGHT VENTRICLE TAPSE (M-mode): 2.3 cm LEFT ATRIUM             Index        RIGHT ATRIUM          Index LA diam:        3.00 cm 2.31 cm/m   RA Area:     9.20 cm LA Vol (A2C):   32.7 ml 25.14 ml/m  RA Volume:   18.00 ml 13.84 ml/m LA Vol (A4C):   24.2 ml 18.61 ml/m LA Biplane Vol: 30.1 ml 23.14 ml/m  AORTIC VALVE LVOT Vmax:   91.67 cm/s LVOT Vmean:  54.633 cm/s LVOT VTI:    0.162 m  AORTA Ao Root diam: 3.00 cm Ao Asc diam:  2.90 cm MITRAL VALVE               TRICUSPID VALVE MV Area (PHT): 1.94 cm     TR Peak grad:   17.5 mmHg MV Decel Time: 391 msec    TR  Vmax:        209.00 cm/s MV E velocity: 52.70 cm/s MV A velocity: 64.30 cm/s  SHUNTS MV E/A ratio:  0.82        Systemic VTI:  0.16 m                            Systemic Diam: 2.00 cm Skeet Latch MD Electronically signed by Skeet Latch MD Signature Date/Time: 10/03/2021/3:25:51 PM    Final    MR BRAIN WO CONTRAST  Result Date: 10/03/2021 CLINICAL DATA:  Stroke follow-up EXAM: MRI HEAD WITHOUT CONTRAST TECHNIQUE: Multiplanar, multiecho pulse sequences of the brain and surrounding structures were obtained without intravenous contrast. COMPARISON:  None Available. FINDINGS: Brain: Multifocal acute/early subacute ischemia within the right cerebellum, predominantly located in the right PICA territory. No supratentorial acute ischemia. Chronic infarct and chronic hemorrhage in the left cerebellum. Old right PCA territory infarct. There is multifocal periventricular white matter hyperintensity, most often a result of chronic microvascular ischemia. The midline structures are normal. Vascular: Major flow voids are preserved. Skull and upper cervical spine: Normal calvarium and skull base. Visualized upper cervical spine and soft tissues are normal. Sinuses/Orbits:No paranasal sinus fluid levels or advanced mucosal thickening. No mastoid or middle ear effusion. Normal orbits. IMPRESSION: 1. Multifocal acute/early subacute ischemia within the right cerebellum, predominantly located in the right PICA territory. No hemorrhage or mass effect. 2. 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 Electronically Signed   By: Ulyses Jarred M.D.   On: 10/03/2021 01:48   CT ANGIO HEAD NECK W WO CM  Result Date: 10/03/2021 CLINICAL DATA:  Dizziness EXAM: CT ANGIOGRAPHY HEAD AND NECK TECHNIQUE: Multidetector CT imaging of the head and neck was performed using the standard protocol during bolus administration of intravenous  contrast. Multiplanar CT image reconstructions and MIPs were obtained to evaluate the vascular anatomy. Carotid stenosis measurements (when applicable) are obtained utilizing NASCET criteria, using the distal internal carotid diameter as the denominator. RADIATION DOSE REDUCTION: This exam was performed according to the departmental dose-optimization program which includes automated exposure control, adjustment of the mA and/or kV according to patient size and/or use of iterative reconstruction technique. CONTRAST:  41mL OMNIPAQUE IOHEXOL 350 MG/ML SOLN COMPARISON:  10/09/2020 FINDINGS: CT HEAD FINDINGS Brain: There is an acute/early subacute infarct of the right PICA territory. There are old bilateral cerebellar infarcts and an old right PCA territory infarct. The size and configuration of the ventricles and extra-axial CSF spaces are normal. There is hypoattenuation of the periventricular white matter, most commonly indicating chronic ischemic microangiopathy. Skull: The visualized skull base, calvarium and extracranial soft tissues are normal. Sinuses/Orbits: No fluid levels or advanced mucosal thickening of the visualized paranasal sinuses. No mastoid or middle ear effusion. The orbits are normal. CTA NECK FINDINGS SKELETON: There is no bony spinal canal stenosis. No lytic or blastic lesion. OTHER NECK: Normal pharynx, larynx and major salivary glands. No cervical lymphadenopathy. Unremarkable thyroid gland. UPPER CHEST: Biapical emphysema. AORTIC ARCH: There is calcific atherosclerosis of the aortic arch. There is no aneurysm, dissection or hemodynamically significant stenosis of the visualized portion of the aorta. Conventional 3 vessel aortic branching pattern. The visualized proximal subclavian arteries are widely patent. RIGHT CAROTID SYSTEM: No dissection, occlusion or aneurysm. Mild atherosclerotic calcification at the carotid bifurcation without hemodynamically significant stenosis. LEFT CAROTID SYSTEM:  Normal without aneurysm, dissection or stenosis. VERTEBRAL ARTERIES: The right vertebral artery V3 segment is  occluded. The right V4 segment is incompletely opacified. The left vertebral artery is non dominant and there is multifocal occlusion or severe stenosis along the V2 segment. CTA HEAD FINDINGS POSTERIOR CIRCULATION: --Vertebral arteries: Normal V4 segments. --Inferior cerebellar arteries: Right PICA not visualized. --Basilar artery: Normal. --Superior cerebellar arteries: Normal. --Posterior cerebral arteries (PCA): Mild stenosis of the right P2 segment. Severe stenosis of the left P2 segment. ANTERIOR CIRCULATION: --Intracranial internal carotid arteries: Normal. --Anterior cerebral arteries (ACA): Mild stenosis of the right A1 segment. Otherwise normal. --Middle cerebral arteries (MCA): Normal. VENOUS SINUSES: As permitted by contrast timing, patent. ANATOMIC VARIANTS: None Review of the MIP images confirms the above findings. IMPRESSION: 1. 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 No hemorrhage or mass effect. 2. 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 3. 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 4. Severe stenosis of the left PCA P2 segment. 5. Aortic Atherosclerosis (ICD10-I70.0) and Emphysema (ICD10-J43.9). 6. Critical Value/emergent results were called by telephone at the time of interpretation on 10/03/2021 at 12:06 am to provider Tanda Rockers , who verbally acknowledged these results. Electronically Signed   By: Deatra Robinson M.D.   On: 10/03/2021 00:07   DG Chest 2 View  Result Date: 09/16/2021 CLINICAL DATA:  COPD EXAM: CHEST - 2 VIEW COMPARISON:  Chest radiograph Jul 09, 2020 FINDINGS: Stable cardiac and mediastinal contours. Pulmonary hyperinflation. No large area pulmonary consolidation. No pleural effusion or pneumothorax. Thoracic spine  degenerative changes. IMPRESSION: No active cardiopulmonary disease. Electronically Signed   By: Annia Belt M.D.   On: 09/16/2021 12:05    Microbiology Recent Results (from the past 240 hour(s))  Resp Panel by RT-PCR (Flu A&B, Covid) Anterior Nasal Swab     Status: None   Collection Time: Oct 11, 2021  8:01 PM   Specimen: Anterior Nasal Swab  Result Value Ref Range Status   SARS Coronavirus 2 by RT PCR NEGATIVE NEGATIVE Final    Comment: (NOTE) SARS-CoV-2 target nucleic acids are NOT DETECTED.  The SARS-CoV-2 RNA is generally detectable in upper respiratory specimens during the acute phase of infection. The lowest concentration of SARS-CoV-2 viral copies this assay can detect is 138 copies/mL. A negative result does not preclude SARS-Cov-2 infection and should not be used as the sole basis for treatment or other patient management decisions. A negative result may occur with  improper specimen collection/handling, submission of specimen other than nasopharyngeal swab, presence of viral mutation(s) within the areas targeted by this assay, and inadequate number of viral copies(<138 copies/mL). A negative result must be combined with clinical observations, patient history, and epidemiological information. The expected result is Negative.  Fact Sheet for Patients:  BloggerCourse.com  Fact Sheet for Healthcare Providers:  SeriousBroker.it  This test is no t yet approved or cleared by the [place] FDA and  has been authorized for detection and/or diagnosis of SARS-CoV-2 by FDA under an Emergency Use Authorization (EUA). This EUA will remain  in effect (meaning this test can be used) for the duration of the COVID-19 declaration under Section 564(b)(1) of the Act, 21 U.S.C.section 360bbb-3(b)(1), unless the authorization is terminated  or revoked sooner.       Influenza A by PCR NEGATIVE NEGATIVE Final   Influenza B by PCR NEGATIVE  NEGATIVE Final    Comment: (NOTE) The Xpert Xpress SARS-CoV-2/FLU/RSV plus assay is intended as an aid in the diagnosis of  influenza from Nasopharyngeal swab specimens and should not be used as a sole basis for treatment. Nasal washings and aspirates are unacceptable for Xpert Xpress SARS-CoV-2/FLU/RSV testing.  Fact Sheet for Patients: EntrepreneurPulse.com.au  Fact Sheet for Healthcare Providers: IncredibleEmployment.be  This test is not yet approved or cleared by the [place] FDA and has been authorized for detection and/or diagnosis of SARS-CoV-2 by FDA under an Emergency Use Authorization (EUA). This EUA will remain in effect (meaning this test can be used) for the duration of the COVID-19 declaration under Section 564(b)(1) of the Act, 21 U.S.C. section 360bbb-3(b)(1), unless the authorization is terminated or revoked.  Performed at [facility], [place] [address], [place], [place] [address]   MRSA Next Gen by PCR, Nasal     Status: None   Collection Time: 10/06/21 12:05 AM   Specimen: Nasal Mucosa; Nasal Swab  Result Value Ref Range Status   MRSA by PCR Next Gen NOT DETECTED NOT DETECTED Final    Comment: (NOTE) The GeneXpert MRSA Assay (FDA approved for NASAL specimens only), is one component of a comprehensive MRSA colonization surveillance program. It is not intended to diagnose MRSA infection nor to guide or monitor treatment for MRSA infections. Test performance is not FDA approved in patients less than 31 years old. Performed at [facility], [place] [address], [place], [place] [address]     Lab Basic Metabolic Panel: Recent Labs  Lab 10/06/2021 2011 10/03/21 0358 10/05/21 2309 10/06/21 1050 10/06/21 2014 11-01-2021 0200 01-Nov-2021 0842 01-Nov-2021 1109  NA 135 134* 134* 140 146* 155* 157*  --   K 3.5 3.3* 3.1*  --   --  3.4*  --   --   CL 101 99 98  --   --  125*  --   --   CO2 24 23 26   --   --  21*  --   --    GLUCOSE 168* 132* 124*  --   --  127*  --   --   BUN 10 11 27*  --   --  29*  --   --   CREATININE 0.46 0.47 0.51  --   --  0.56  --   --   CALCIUM 9.0 8.7* 9.3  --   --  8.8*  --   --   MG  --   --  2.2  --   --   --   --  2.5*  PHOS  --   --   --   --   --   --   --  4.1   Liver Function Tests: Recent Labs  Lab 09/20/2021 2011 10/03/21 0358 10/05/21 2309 11/01/2021 0200  AST 25 24 29  42*  ALT 24 25 28  48*  ALKPHOS 115 126 118 127*  BILITOT 0.5 0.6 0.4 0.4  PROT 6.5 6.4* 6.5 5.6*  ALBUMIN 4.1 4.0 4.0 3.3*   Recent Labs  Lab 09/17/2021 2011  LIPASE 21   No results for input(s): "AMMONIA" in the last 168 hours. CBC: Recent Labs  Lab 09/25/2021 2011 10/03/21 0358 10/05/21 2309 11/01/21 0824  WBC 9.0 8.2 14.1* 9.8  NEUTROABS 8.1*  --  12.3*  --   HGB 14.4 14.4 16.3* 14.2  HCT 42.7 42.0 46.7* 43.1  MCV 94.5 93.8 91.2 97.5  PLT 188 180 221 178   Cardiac Enzymes: No results for input(s): "CKTOTAL", "CKMB", "CKMBINDEX", "TROPONINI" in the last 168 hours. Sepsis Labs: Recent Labs  Lab 09/09/2021 2011 10/03/21 0358 10/05/21 2309 2021/10/13 0824  WBC 9.0 8.2 14.1* 9.8    Procedures/Operations   8/28 started HTS due to cytotoxic edema, d/c'ed due to no IV access. Restarted HTS @50cc /hr and reached goal Na. 8/29 Coretrak placed.   Rolanda Lundborg 10-13-2021, 2:48 PM  I have personally obtained history,examined this patient, reviewed notes, independently viewed imaging studies, participated in medical decision making and plan of care.ROS completed by me personally and pertinent positives fully documented  I have made any additions or clarifications directly to the above note. Agree with note above.    Antony Contras, MD Medical Director [facility] Pager: [phone] 10/08/2021 3:35 PM

## 2021-10-10 DEATH — deceased

## 2022-09-17 ENCOUNTER — Ambulatory Visit: Payer: Medicare Other | Admitting: Internal Medicine

## 2023-01-11 IMAGING — CT CT HEAD W/O CM
1 series · 13 of 30 positions shown, 17 images · non-contrast
Comparison: No pertinent prior exams available for comparison.
COMPARISON: No pertinent prior exams available for comparison.

Addendum:
CLINICAL DATA: Injury of head, initial encounter
([id number]). Additional history provided: Fall 4 days ago hitting
back of head. Fall again the next evening hitting right side of
head, bruising to right temple area. Dizziness.

EXAM:
CT HEAD WITHOUT CONTRAST
TECHNIQUE: Contiguous axial images were obtained from the base of the skull
through the vertex without intravenous contrast.

[Series 2: head w/(date) · axial · 0.41mm/px · z∈[-162,-27]mm · 13 of 33 slices shown, 17 images]
[im 3/33  brain]
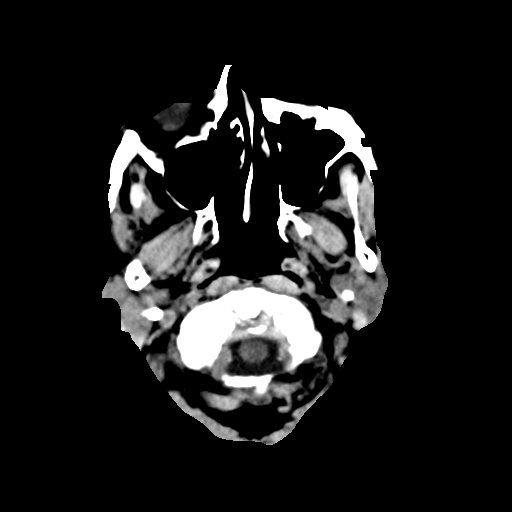
[im 3/33  bone]
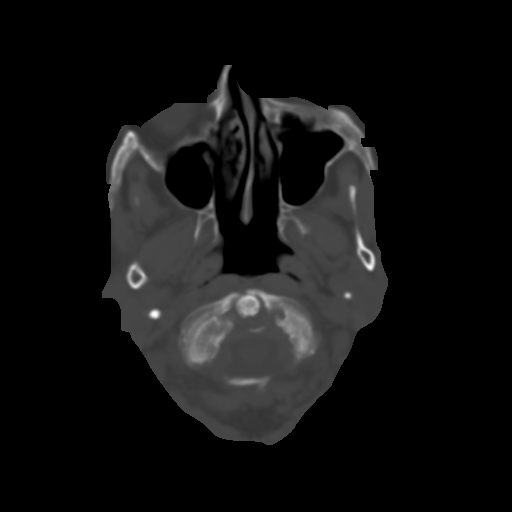
[im 5/33  brain]
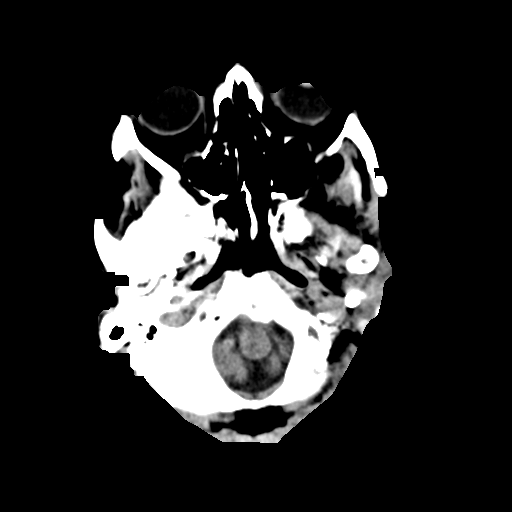
[im 7/33  brain]
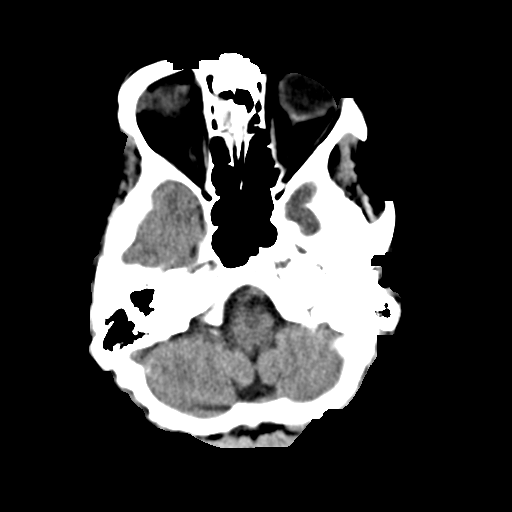
[im 9/33  brain]
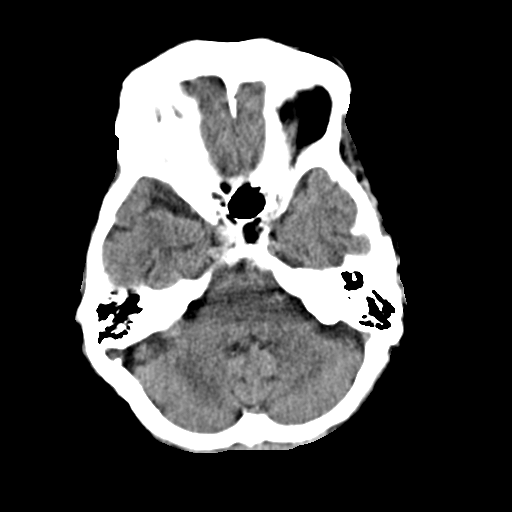
[im 12/33  brain]
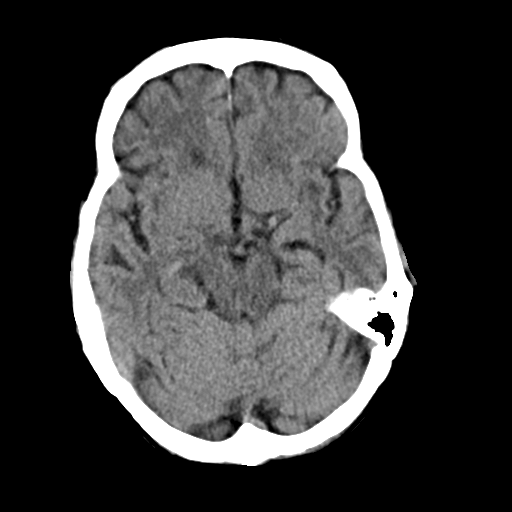
[im 12/33  bone]
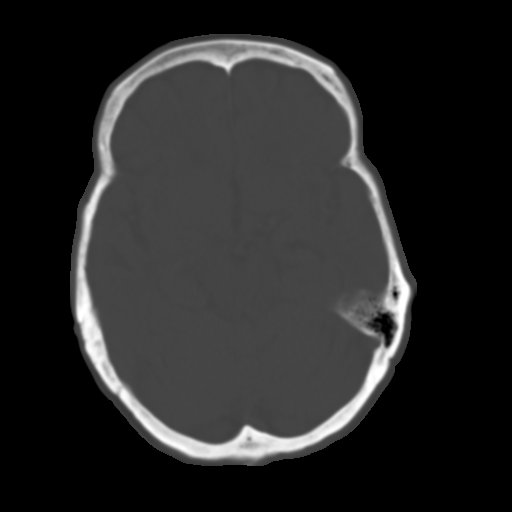
[im 14/33  brain]
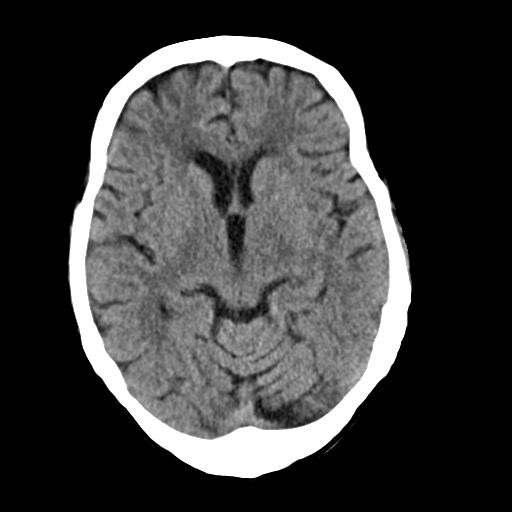
[im 17/33  brain]
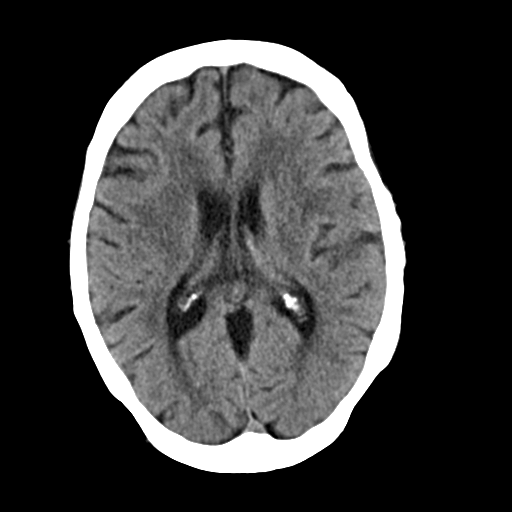
[im 19/33  brain]
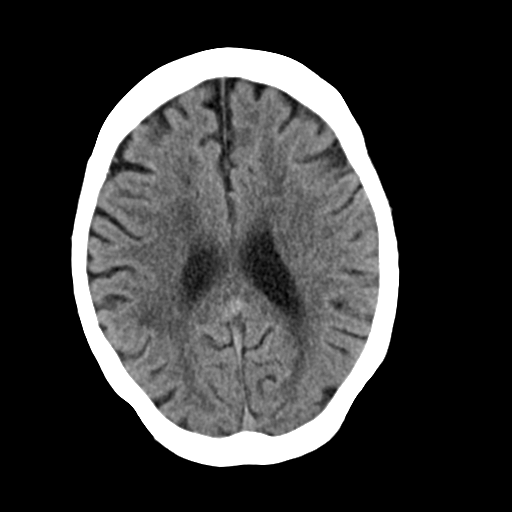
[im 21/33  brain]
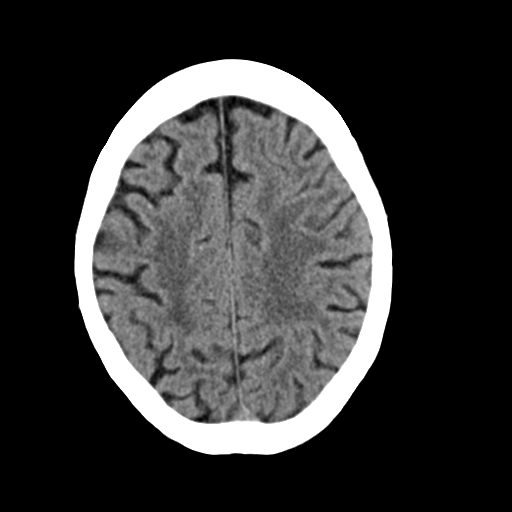
[im 21/33  bone]
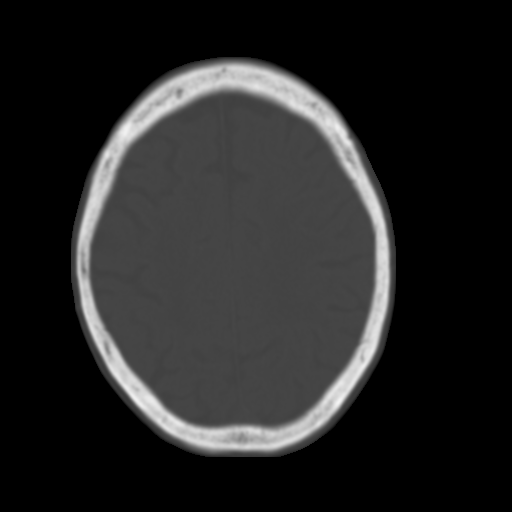
[im 24/33  brain]
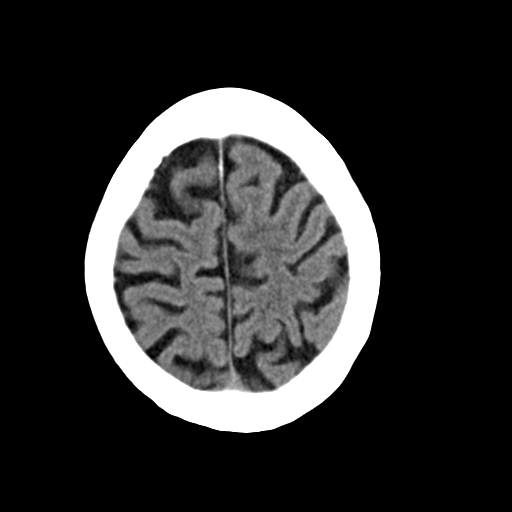
[im 26/33  brain]
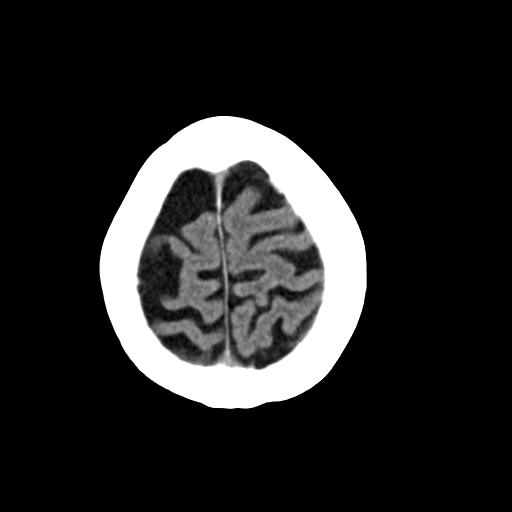
[im 28/33  brain]
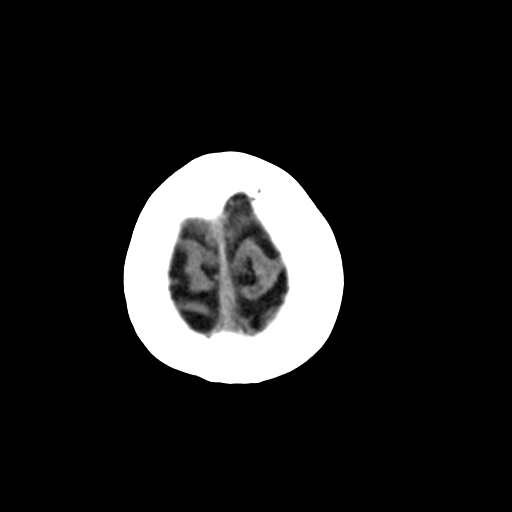
[im 30/33  brain]
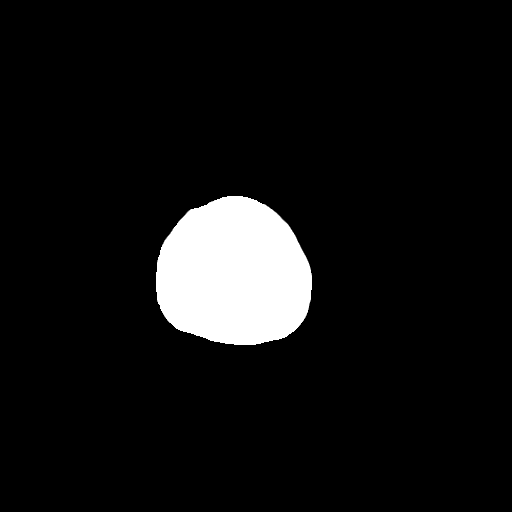
[im 30/33  bone]
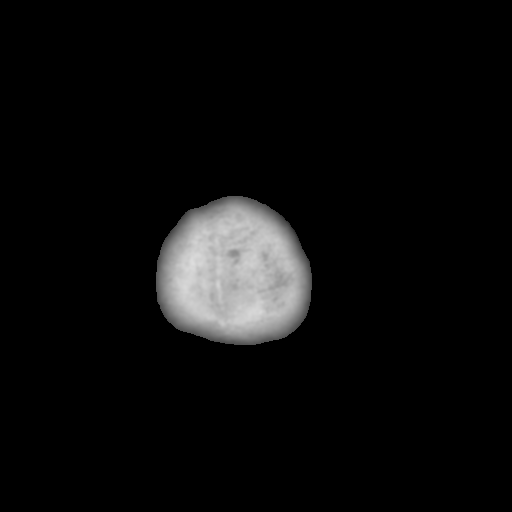

[13 of 30 positions shown; findings below may reference images not displayed]

FINDINGS: Brain:

Mild generalized cerebral atrophy.

Mild patchy and ill-defined hypoattenuation within the white matter,
nonspecific but compatible with chronic small vessel ischemic
disease.

There is no acute intracranial hemorrhage.

No demarcated cortical infarct.

No extra-axial fluid collection.

No evidence of an intracranial mass.

No midline shift.

Vascular: No hyperdense vessel. Atherosclerotic calcifications.

Skull: Normal. Negative for fracture or focal lesion.

Sinuses/Orbits: Visualized orbits show no acute finding.
Small-volume frothy secretions within the right sphenoid sinus.

Other: Incompletely imaged displaced fracture versus developmental
defect within the anterior arch of C1 (for instance as seen on
series 5, image 4). Possible incompletely imaged fracture within the
left C1 posterior arch (series 5, image 2). Probable developmental
defect within the right posterior arch of C1. There is asymmetric
widening of the right lateral atlantodental interval to 7 mm (series
5, image 3).
IMPRESSION: Incompletely imaged displaced fracture versus developmental defect
within the anterior arch of C1. Possible incompletely imaged
fracture within the left C1 posterior arch. Asymmetric widening of
the right lateral atlantodental interval to [address] mm[address] of
the cervical spine is recommended for further evaluation.

No evidence of acute intracranial abnormality.

Mild chronic small-vessel ischemic changes within the cerebral white
matter.

Mild generalized cerebral atrophy.

Right sphenoid sinusitis.

ADDENDUM:
Impression #1 called by telephone at the time of interpretation on
10/09/2020 at [DATE] to provider Dr. Zucker, who verbally
acknowledged these results.

*** End of Addendum ***
FINDINGS: Brain:

Mild generalized cerebral atrophy.

Mild patchy and ill-defined hypoattenuation within the white matter,
nonspecific but compatible with chronic small vessel ischemic
disease.

There is no acute intracranial hemorrhage.

No demarcated cortical infarct.

No extra-axial fluid collection.

No evidence of an intracranial mass.

No midline shift.

Vascular: No hyperdense vessel. Atherosclerotic calcifications.

Skull: Normal. Negative for fracture or focal lesion.

Sinuses/Orbits: Visualized orbits show no acute finding.
Small-volume frothy secretions within the right sphenoid sinus.

Other: Incompletely imaged displaced fracture versus developmental
defect within the anterior arch of C1 (for instance as seen on
series 5, image 4). Possible incompletely imaged fracture within the
left C1 posterior arch (series 5, image 2). Probable developmental
defect within the right posterior arch of C1. There is asymmetric
widening of the right lateral atlantodental interval to 7 mm (series
5, image 3).
IMPRESSION: Incompletely imaged displaced fracture versus developmental defect
within the anterior arch of C1. Possible incompletely imaged
fracture within the left C1 posterior arch. Asymmetric widening of
the right lateral atlantodental interval to [address] of
the cervical spine is recommended for further evaluation.

No evidence of acute intracranial abnormality.

Mild chronic small-vessel ischemic changes within the cerebral white
matter.

Mild generalized cerebral atrophy.

Right sphenoid sinusitis.
